# Patient Record
Sex: Female | Born: 1960 | Race: White | Hispanic: No | Marital: Married | Smoking: Never smoker
Health system: Southern US, Community
[De-identification: ages and names within clinical notes are randomized; demographics above are authoritative.]

## PROBLEM LIST (undated history)

## (undated) DIAGNOSIS — L409 Psoriasis, unspecified: Secondary | ICD-10-CM

## (undated) DIAGNOSIS — E559 Vitamin D deficiency, unspecified: Secondary | ICD-10-CM

## (undated) DIAGNOSIS — E119 Type 2 diabetes mellitus without complications: Secondary | ICD-10-CM

## (undated) DIAGNOSIS — E782 Mixed hyperlipidemia: Secondary | ICD-10-CM

## (undated) DIAGNOSIS — M5430 Sciatica, unspecified side: Secondary | ICD-10-CM

## (undated) DIAGNOSIS — G510 Bell's palsy: Secondary | ICD-10-CM

## (undated) HISTORY — DX: Vitamin D deficiency, unspecified: E55.9

## (undated) HISTORY — DX: Sciatica, unspecified side: M54.30

## (undated) HISTORY — DX: Bell's palsy: G51.0

## (undated) HISTORY — DX: Psoriasis, unspecified: L40.9

## (undated) HISTORY — DX: Mixed hyperlipidemia: E78.2

## (undated) HISTORY — PX: ANKLE ARTHROSCOPY: SUR85

## (undated) HISTORY — DX: Type 2 diabetes mellitus without complications: E11.9

## (undated) HISTORY — PX: ROBOTIC ASSISTED LAPAROSCOPIC VAGINAL HYSTERECTOMY WITH FIBROID REMOVAL: SHX5387

## (undated) HISTORY — PX: TONSILLECTOMY AND ADENOIDECTOMY: SUR1326

---

## 2008-04-07 ENCOUNTER — Ambulatory Visit: Payer: Self-pay | Admitting: Cardiology

## 2009-06-20 ENCOUNTER — Ambulatory Visit: Payer: Self-pay | Admitting: Specialist

## 2009-06-27 ENCOUNTER — Ambulatory Visit: Payer: Self-pay | Admitting: Specialist

## 2011-12-23 ENCOUNTER — Ambulatory Visit: Payer: Self-pay | Admitting: Family Medicine

## 2012-01-05 ENCOUNTER — Ambulatory Visit: Payer: Self-pay | Admitting: Family Medicine

## 2013-07-28 ENCOUNTER — Ambulatory Visit: Payer: Self-pay | Admitting: Specialist

## 2013-07-28 IMAGING — CR DG KNEE 1-2V*R*
1 series · 2 of 2 positions shown · non-contrast
Comparison: none

REASON FOR EXAM: right knee/ standing  knee pain
COMMENTS:

[Series 1: ap · 0.17mm/px · 2 of 2 slices shown]
[im 1/2]
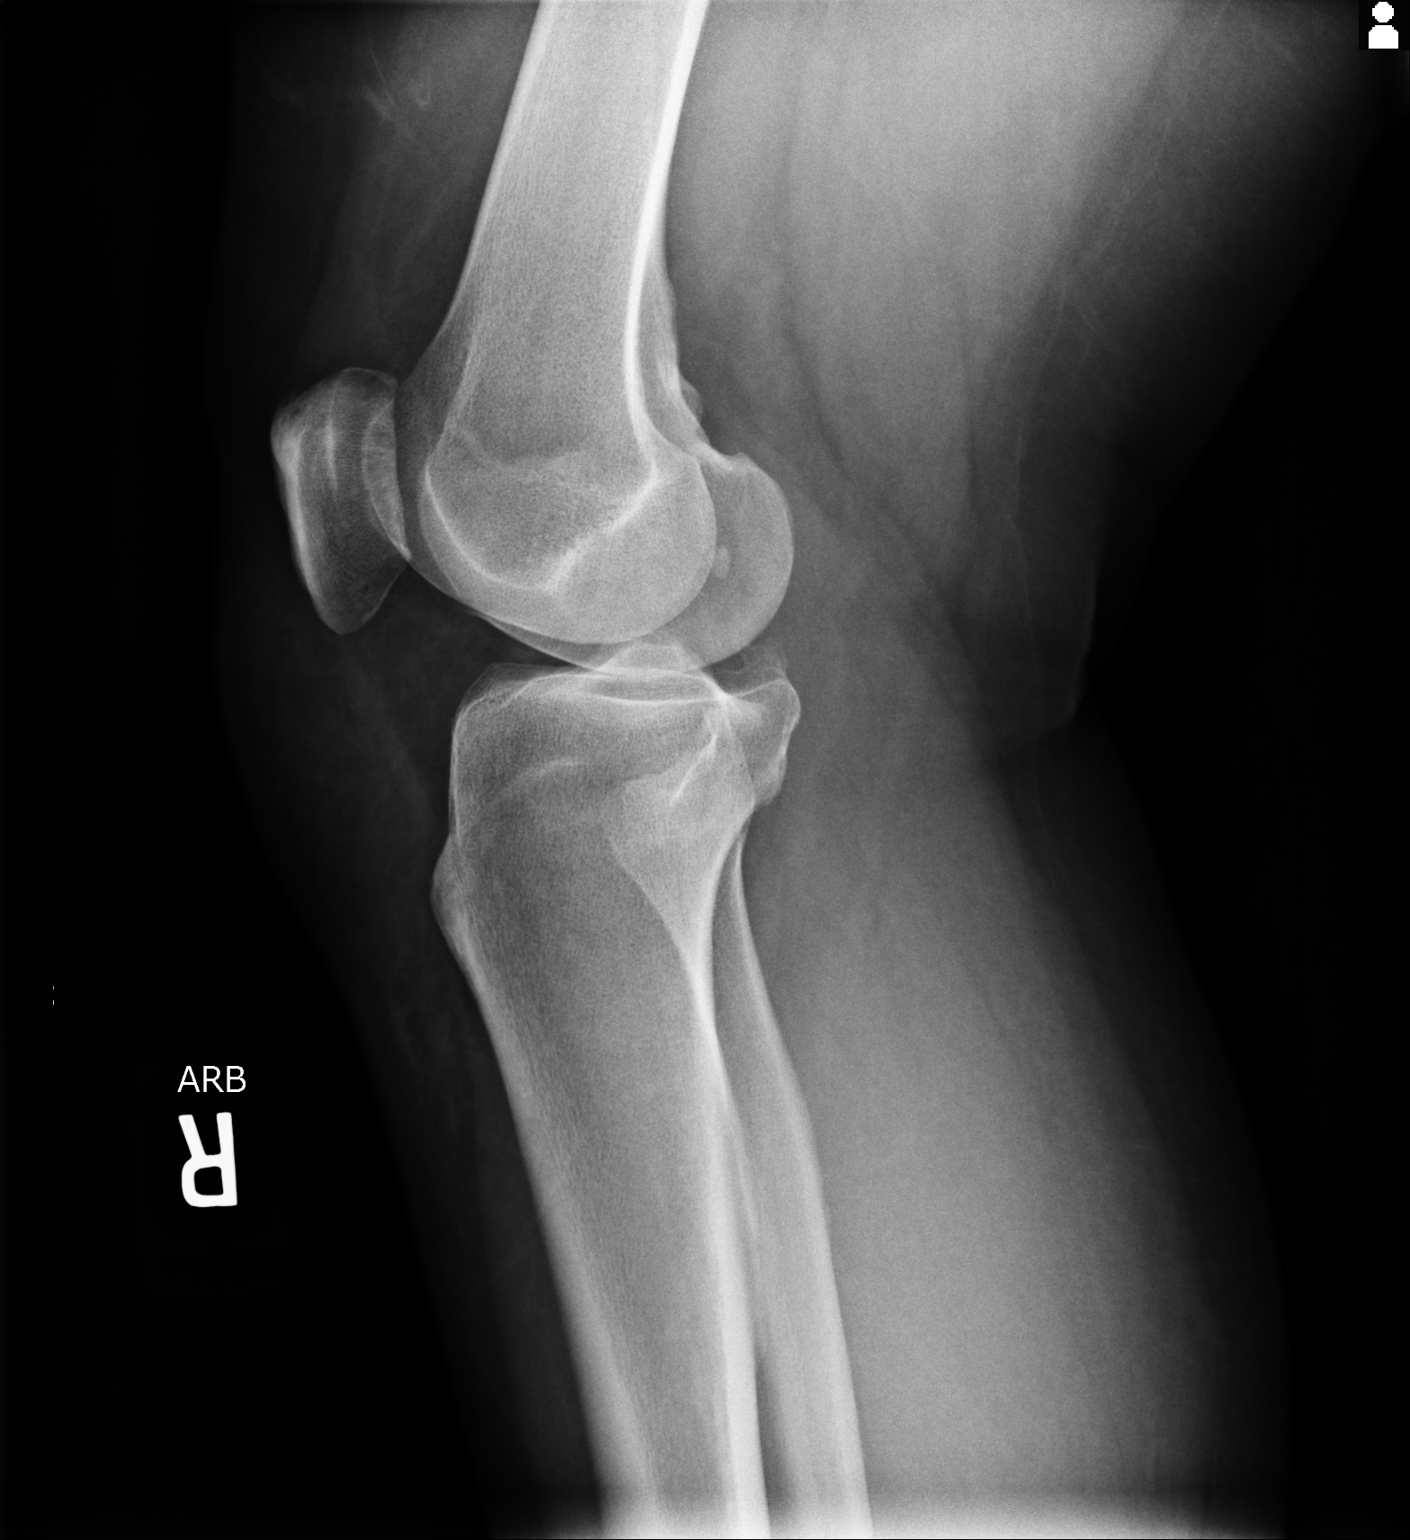
[im 2/2]
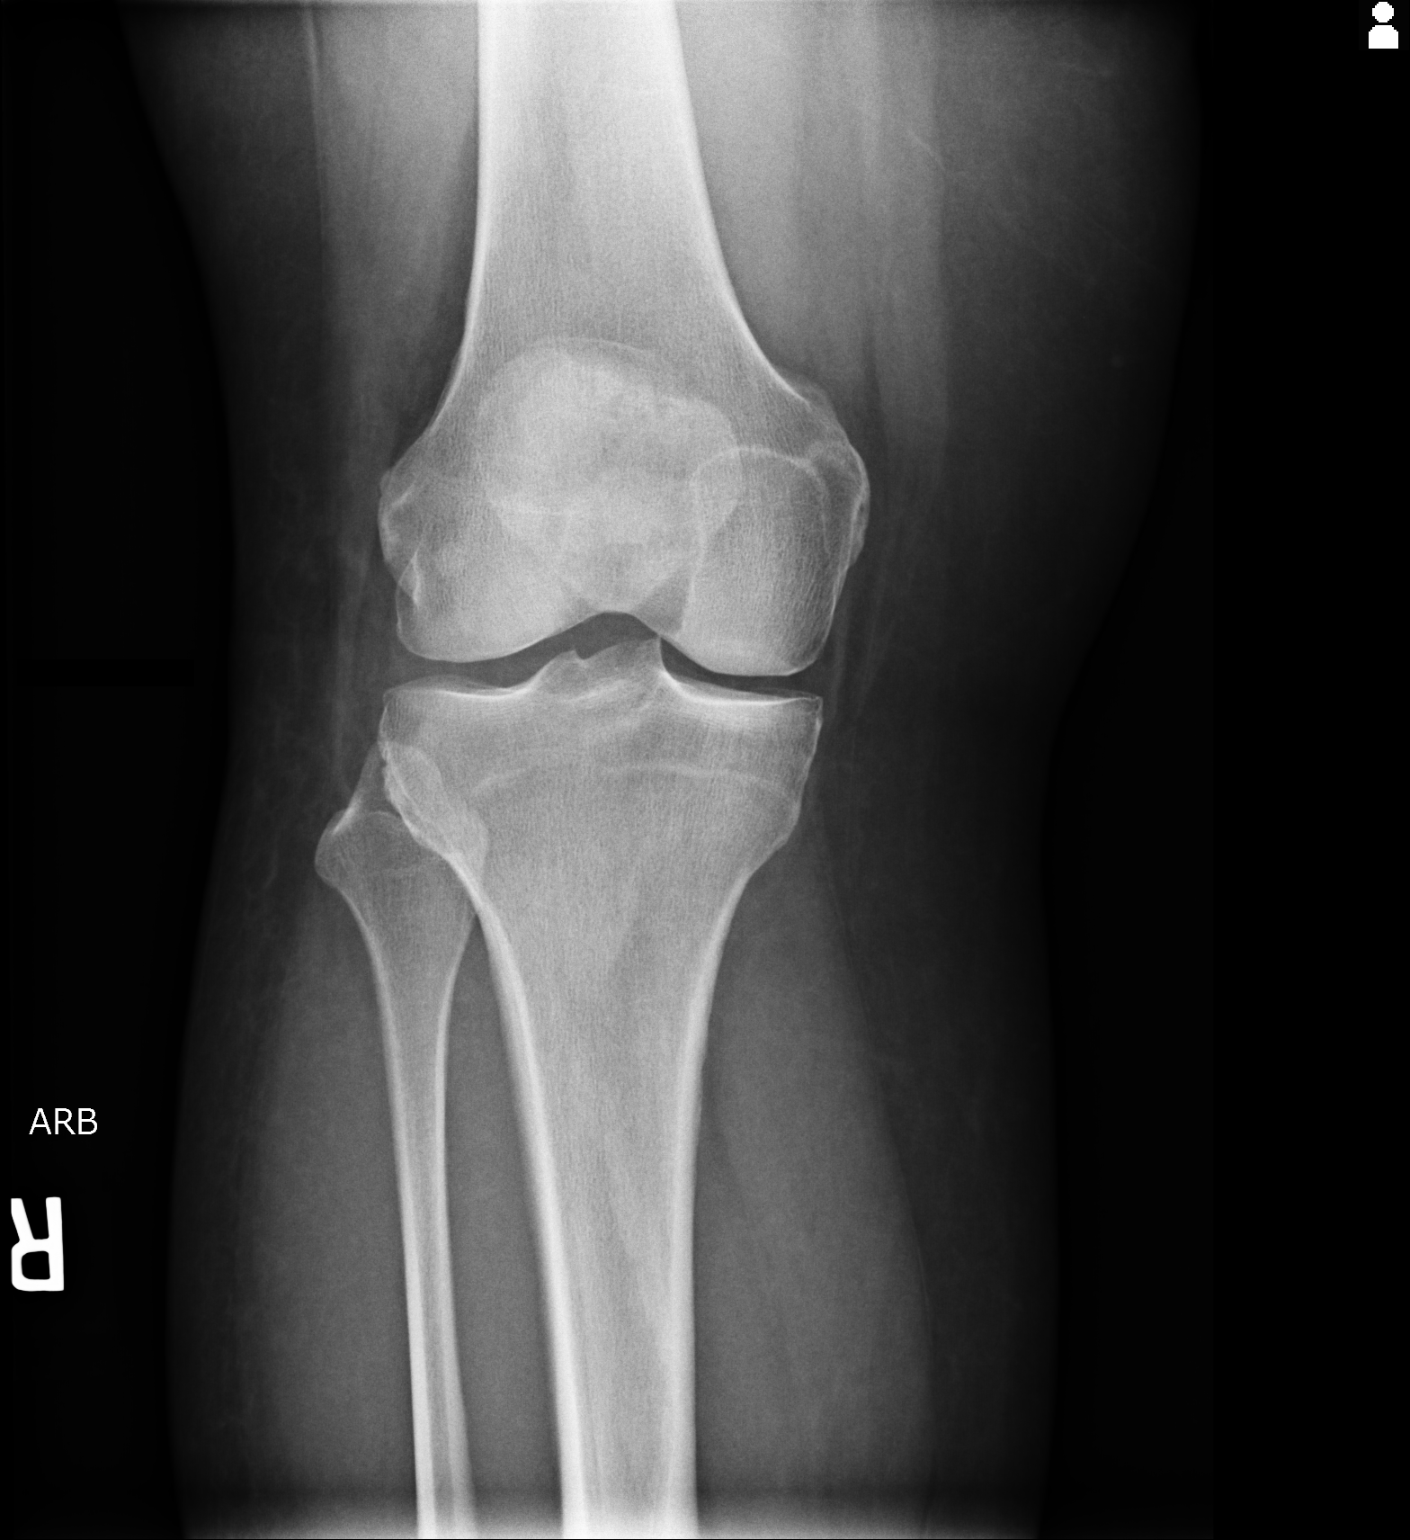

[2 of 2 positions shown; findings below may reference images not displayed]

PROCEDURE:     DXR - DXR KNEE RIGHT AP AND LATERAL  - [DATE]  [DATE]

RESULT:     AP and lateral views performed standing reveal the bones to be
adequately mineralized. There is minimal narrowing of the medial joint
compartment. There is beaking of the tibial spines. There is no evidence of
a joint effusion. No tibial plateau fracture is demonstrated. The proximal
fibula appears intact.
IMPRESSION: There is no evidence of an acute fracture of the right
knee. Mild degenerative changes are present.

[REDACTED]

## 2013-08-16 ENCOUNTER — Encounter: Payer: Self-pay | Admitting: Specialist

## 2013-08-27 ENCOUNTER — Encounter: Payer: Self-pay | Admitting: Specialist

## 2013-09-07 ENCOUNTER — Ambulatory Visit: Payer: Self-pay | Admitting: Specialist

## 2013-09-26 ENCOUNTER — Encounter: Payer: Self-pay | Admitting: Specialist

## 2013-10-27 DIAGNOSIS — M5430 Sciatica, unspecified side: Secondary | ICD-10-CM

## 2013-10-27 HISTORY — DX: Sciatica, unspecified side: M54.30

## 2013-12-14 ENCOUNTER — Emergency Department: Payer: Self-pay | Admitting: Emergency Medicine

## 2013-12-26 ENCOUNTER — Ambulatory Visit: Payer: Self-pay | Admitting: Specialist

## 2014-01-31 ENCOUNTER — Encounter: Payer: Self-pay | Admitting: Physical Medicine and Rehabilitation

## 2014-02-24 ENCOUNTER — Encounter: Payer: Self-pay | Admitting: Physical Medicine and Rehabilitation

## 2014-03-27 ENCOUNTER — Encounter: Payer: Self-pay | Admitting: Physical Medicine and Rehabilitation

## 2014-04-26 ENCOUNTER — Encounter: Payer: Self-pay | Admitting: Physical Medicine and Rehabilitation

## 2015-02-06 ENCOUNTER — Encounter: Payer: Self-pay | Admitting: *Deleted

## 2015-02-15 LAB — HM PAP SMEAR: HM PAP: NEGATIVE

## 2015-02-20 LAB — HEMOGLOBIN A1C: Hgb A1c MFr Bld: 11.3 % — AB (ref 4.0–6.0)

## 2015-02-20 LAB — LIPID PANEL
Cholesterol: 226 mg/dL — AB (ref 0–200)
LDL CALC: 142 mg/dL
TRIGLYCERIDES: 177 mg/dL — AB (ref 40–160)

## 2015-03-29 ENCOUNTER — Encounter: Payer: Self-pay | Admitting: Internal Medicine

## 2015-03-30 ENCOUNTER — Ambulatory Visit (INDEPENDENT_AMBULATORY_CARE_PROVIDER_SITE_OTHER): Payer: 59 | Admitting: Internal Medicine

## 2015-03-30 ENCOUNTER — Other Ambulatory Visit: Payer: Self-pay

## 2015-03-30 ENCOUNTER — Encounter: Payer: Self-pay | Admitting: Internal Medicine

## 2015-03-30 VITALS — BP 126/84 | HR 98 | Temp 97.9°F | Resp 12 | Ht 64.5 in | Wt 257.0 lb

## 2015-03-30 DIAGNOSIS — E1165 Type 2 diabetes mellitus with hyperglycemia: Secondary | ICD-10-CM | POA: Diagnosis not present

## 2015-03-30 DIAGNOSIS — E559 Vitamin D deficiency, unspecified: Secondary | ICD-10-CM

## 2015-03-30 DIAGNOSIS — E785 Hyperlipidemia, unspecified: Secondary | ICD-10-CM | POA: Diagnosis not present

## 2015-03-30 MED ORDER — GLUCOSE BLOOD VI STRP: ORAL_STRIP | Status: DC

## 2015-03-30 MED ORDER — INSULIN PEN NEEDLE 32G X 4 MM MISC: Status: DC

## 2015-03-30 MED ORDER — INSULIN GLARGINE 100 UNIT/ML SOLOSTAR PEN: 15.0000 [IU] | PEN_INJECTOR | Freq: Every day | SUBCUTANEOUS | Status: DC

## 2015-03-30 MED ORDER — ONETOUCH LANCETS MISC
Status: DC
Start: 2015-03-30 — End: 2017-03-17

## 2015-03-30 NOTE — Patient Instructions (Signed)
Please start Lantus 15 units at bedtime. After 4 days, if sugars still >130, you can increase to 20 units.  Please let me know if the sugars are consistently <80 or >200 afterwards.  Please return in 1.5 month with your sugar log.   PATIENT INSTRUCTIONS FOR TYPE 2 DIABETES:  **Please join MyChart!** - see attached instructions about how to join if you have not done so already.  DIET AND EXERCISE Diet and exercise is an important part of diabetic treatment.  We recommended aerobic exercise in the form of brisk walking (working between 40-60% of maximal aerobic capacity, similar to brisk walking) for 150 minutes per week (such as 30 minutes five days per week) along with 3 times per week performing 'resistance' training (using various gauge rubber tubes with handles) 5-10 exercises involving the major muscle groups (upper body, lower body and core) performing 10-15 repetitions (or near fatigue) each exercise. Start at half the above goal but build slowly to reach the above goals. If limited by weight, joint pain, or disability, we recommend daily walking in a swimming pool with water up to waist to reduce pressure from joints while allow for adequate exercise.    BLOOD GLUCOSES Monitoring your blood glucoses is important for continued management of your diabetes. Please check your blood glucoses 2-4 times a day: fasting, before meals and at bedtime (you can rotate these measurements - e.g. one day check before the 3 meals, the next day check before 2 of the meals and before bedtime, etc.).   HYPOGLYCEMIA (low blood sugar) Hypoglycemia is usually a reaction to not eating, exercising, or taking too much insulin/ other diabetes drugs.  Symptoms include tremors, sweating, hunger, confusion, headache, etc. Treat IMMEDIATELY with 15 grams of Carbs: . 4 glucose tablets .  cup regular juice/soda . 2 tablespoons raisins . 4 teaspoons sugar . 1 tablespoon honey Recheck blood glucose in 15 mins and  repeat above if still symptomatic/blood glucose <100.  RECOMMENDATIONS TO REDUCE YOUR RISK OF DIABETIC COMPLICATIONS: * Take your prescribed MEDICATION(S) * Follow a DIABETIC diet: Complex carbs, fiber rich foods, (monounsaturated and polyunsaturated) fats * AVOID saturated/trans fats, high fat foods, >2,300 mg salt per day. * EXERCISE at least 5 times a week for 30 minutes or preferably daily.  * DO NOT SMOKE OR DRINK more than 1 drink a day. * Check your FEET every day. Do not wear tightfitting shoes. Contact us if you develop an ulcer * See your EYE doctor once a year or more if needed * Get a FLU shot once a year * Get a PNEUMONIA vaccine once before and once after age 51 years  GOALS:  * Your Hemoglobin A1c of <7%  * fasting sugars need to be <130 * after meals sugars need to be <180 (2h after you start eating) * Your Systolic BP should be 630 or lower  * Your Diastolic BP should be 80 or lower  * Your HDL (Good Cholesterol) should be 40 or higher  * Your LDL (Bad Cholesterol) should be 100 or lower. * Your Triglycerides should be 150 or lower  * Your Urine microalbumin (kidney function) should be <30 * Your Body Mass Index should be 25 or lower    Please consider the following ways to cut down carbs and fat and increase fiber and micronutrients in your diet: - substitute whole grain for white bread or pasta - substitute brown rice for white rice - substitute 90-calorie flat bread pieces for slices of  bread when possible - substitute sweet potatoes or yams for white potatoes - substitute humus for margarine - substitute tofu for cheese when possible - substitute almond or rice milk for regular milk (would not drink soy milk daily due to concern for soy estrogen influence on breast cancer risk) - substitute dark chocolate for other sweets when possible - substitute water - can add lemon or orange slices for taste - for diet sodas (artificial sweeteners will trick your body that  you can eat sweets without getting calories and will lead you to overeating and weight gain in the long run) - do not skip breakfast or other meals (this will slow down the metabolism and will result in more weight gain over time)  - can try smoothies made from fruit and almond/rice milk in am instead of regular breakfast - can also try old-fashioned (not instant) oatmeal made with almond/rice milk in am - order the dressing on the side when eating salad at a restaurant (pour less than half of the dressing on the salad) - eat as little meat as possible - can try juicing, but should not forget that juicing will get rid of the fiber, so would alternate with eating raw veg./fruits or drinking smoothies - use as little oil as possible, even when using olive oil - can dress a salad with a mix of balsamic vinegar and lemon juice, for e.g. - use agave nectar, stevia sugar, or regular sugar rather than artificial sweateners - steam or broil/roast veggies  - snack on veggies/fruit/nuts (unsalted, preferably) when possible, rather than processed foods - reduce or eliminate aspartame in diet (it is in diet sodas, chewing gum, etc) Read the labels!  Try to read Dr. Janene Harvey book: "Program for Reversing Diabetes" for other ideas for healthy eating.

## 2015-03-30 NOTE — Progress Notes (Signed)
Patient ID: Cassandra Munoz, female   DOB: February 02, 1961, 54 y.o.   MRN: [id number]  HPI: Cassandra Munoz is a 54 y.o.-year-old female, referred by [name]), for management of DM2, 2012, non-insulin-dependent, uncontrolled, without complications.  She was told her HbA1c was high in the past but she did not follow up with PCP.  Denies blurry vision, but may have increased thirst.  Last hemoglobin A1c was: Lab Results  Component Value Date   HGBA1C 11.3* 02/20/2015  2012: HbA1c 7%  Pt on no meds yet.   Pt does check her sugars. - am: n/c - 2h after b'fast: n/c - before lunch: n/c - 2h after lunch: n/c - before dinner: n/c - 2h after dinner: n/c - bedtime: n/c - nighttime: n/c  ? hypoglycemia awareness. Highest sugar was 264 (nonfasting)  Glucometer: none  Pt's meals are: - Breakfast: can skip; honey nut cheerios + milk - Lunch: grilled cheese sandwich; grilled chicken sandwich; rarely salad or grilled bean - Dinner: [place] food;  - Snacks: chocolate, sweet tea  - no CKD, last BUN/creatinine:  02/20/2015: 11/0.73.  - last set of lipids: Lab Results  Component Value Date   CHOL 226* 02/20/2015   LDLCALC 142 02/20/2015   TRIG 177* 02/20/2015   - last eye exam was in 01/2015.No DR. - no numbness and tingling in her feet.  Pt has FH of DM in mother dx at 70 y/o, father.  She also has a h/o mild HTG: 177 on 02/20/2015 (nonfasting). LDL is high for a pt with DM: 142.  She also has a h/o vit D def (9.8 on 02/15/2015) >> started on Ergocalciferol by PCP on 02/22/2015.  ROS: Constitutional: no weight gain/loss, no fatigue, no subjective hyperthermia/hypothermia Eyes: no blurry vision, no xerophthalmia ENT: no sore throat, no nodules palpated in throat, no dysphagia/odynophagia, no hoarseness Cardiovascular: no CP/SOB/palpitations/leg swelling Respiratory: no cough/SOB Gastrointestinal: no N/V/D/C Musculoskeletal: no muscle/joint aches Skin: no  rashes Neurological: no tremors/numbness/tingling/dizziness Psychiatric: no depression/anxiety  Past Medical History  Diagnosis Date  . Psoriasis   . Enlarged uterus   . Enlarged uterus   . Abnormal glucose   . Vitamin D deficiency   . Elevated cholesterol with elevated triglycerides   . Psoriasis    Past Surgical History  Procedure Laterality Date  . Robotic assisted laparoscopic vaginal hysterectomy with fibroid removal      Fibroid removal  . Tonsillectomy and adenoidectomy     History   Social History Main Topics  . Smoking status: Never Smoker   . Smokeless tobacco: Not on file  . Alcohol Use: 0.0 oz/week    0 Standard drinks or equivalent per week  . Drug Use: No  . 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 Apremilast 30 MG TABS Take 2 mg by mouth daily.    . ergocalciferol (VITAMIN D2) 50000 UNITS capsule Take 50,000 Units by mouth 2 (two) times a week.     No current facility-administered medications on file prior to visit.   Allergies  Allergen Reactions  . Codeine Nausea Only    Other Reaction: oversedation  . Clobetasol Rash   Family History  Problem Relation Age of Onset  . Diabetes Mother   .  Cancer Father     Colon cancer  . Heart disease Father    PE: BP 126/84 mmHg  Pulse 98  Temp(Src) 97.9 F (36.6 C) (Oral)  Resp 12  Ht 5' 4.5" (1.638 m)  Wt 257 lb (116.574 kg)  BMI 43.45 kg/m2  SpO2 97% Wt Readings from Last 3 Encounters:  03/30/15 257 lb (116.574 kg)   Constitutional: obese, in NAD Eyes: PERRLA, EOMI, no exophthalmos ENT: moist mucous membranes, no thyromegaly, no cervical lymphadenopathy Cardiovascular: RRR, No MRG Respiratory: CTA B Gastrointestinal: abdomen soft, NT, ND,  BS+ Musculoskeletal: no deformities, strength intact in all 4 Skin: moist, warm, no rashes Neurological: no tremor with outstretched hands, DTR normal in all 4  ASSESSMENT: 1. DM2, new dx  2. HL  3. Vit D def  PLAN:  1. Patient with a 3 year h/o DM, now with a very high HbA1c. She has been found to have a HbA1c in the Diabetic range in 2012 during health screening at work and was advised to see PCP, but she did not follow through with this. We discussed that her DM appears very poorly controlled and we discussed about possible acute orr chronic complications if she does not achieve good control. We also discussed about the importance of diet and I made specific suggestions about improving her diet. She needs a nutrition appt >> will have this at [facility].  For now, I believe that she is very glucotoxic, and needs insulin. I explained that this is unlikely a permanent tx >> we may be able to stop this and switch to oral meds in the near future - I suggested to:  Patient Instructions  Please start Lantus 15 units at bedtime. After 4 days, if sugars still >130, you can increase to 20 units.  Please let me know if the sugars are consistently <80 or >200 afterwards.  Please return in 1.5 month with your sugar log.   - Strongly advised her to start checking sugars at different times of the day - check 1-2 times a day, rotating checks >> given a One Touch Verio meter and demonstrated use - given sugar log and advised how to fill it and to bring it at next appt  - given foot care handout and explained the principles  - given instructions for hypoglycemia management "15-15 rule"  - advised for yearly eye exams >> she is UTD - Return to clinic in 1.5 mo with sugar log   2. HL - Mild elevation in triglycerides, which is expected in the setting of uncontrolled diabetes; insulin will help - High LDL - we may need to start a statin if this does not improve after her diabetes improves.  3. Vit D  def - Reviewed the very low vitamin D level obtained month and a half ago - She is appropriately replaced with ergocalciferol 50,000 units 2x a week

## 2015-04-01 ENCOUNTER — Encounter: Payer: Self-pay | Admitting: Internal Medicine

## 2015-04-10 ENCOUNTER — Ambulatory Visit (INDEPENDENT_AMBULATORY_CARE_PROVIDER_SITE_OTHER): Payer: 59 | Admitting: Obstetrics and Gynecology

## 2015-04-10 ENCOUNTER — Encounter: Payer: Self-pay | Admitting: Obstetrics and Gynecology

## 2015-04-10 VITALS — BP 130/80 | HR 93 | Ht 65.0 in | Wt 258.2 lb

## 2015-04-10 DIAGNOSIS — D259 Leiomyoma of uterus, unspecified: Secondary | ICD-10-CM | POA: Diagnosis not present

## 2015-04-10 DIAGNOSIS — R638 Other symptoms and signs concerning food and fluid intake: Secondary | ICD-10-CM

## 2015-04-10 DIAGNOSIS — N393 Stress incontinence (female) (male): Secondary | ICD-10-CM

## 2015-04-11 ENCOUNTER — Encounter: Payer: Self-pay | Admitting: Obstetrics and Gynecology

## 2015-04-11 DIAGNOSIS — D219 Benign neoplasm of connective and other soft tissue, unspecified: Secondary | ICD-10-CM | POA: Insufficient documentation

## 2015-04-11 DIAGNOSIS — N393 Stress incontinence (female) (male): Secondary | ICD-10-CM | POA: Insufficient documentation

## 2015-04-11 DIAGNOSIS — R638 Other symptoms and signs concerning food and fluid intake: Secondary | ICD-10-CM | POA: Insufficient documentation

## 2015-04-11 NOTE — Patient Instructions (Signed)
1.  Patient scheduled for TAH/BSO. 2.  Patient to return for preop appointment.

## 2015-04-11 NOTE — Progress Notes (Signed)
GYN ENCOUNTER NOTE  Subjective:       Cassandra Munoz is a 54 y.o. No obstetric history on file. female is here for gynecologic evaluation of the following issues:  1. Symptomatic fibroid uterus.  The patient is a 54 year old married white female, para 0, menopausal, who presents in referral from [facility] for evaluation of some dramatic fibroid uterus.  Patient became menopausal 1.5 years ago.  She has noted over the past 5 years.  They abdominal pelvic discomfort and deep thrusting dyspareunia.  She delayed medical attention until this time.  GU symptoms: Frequency 6 times per day. Nocturia 1-2 times per night. No urgency symptoms. Stress incontinence present with occasional need to wear pads.  GI symptoms: Bowel movements several times per day. No chronic constipation.   Gynecologic History No LMP recorded. Patient is postmenopausal.  Menarche age 28. Cycles were monthly throughout her reproductive life. No history of dysmenorrhea. Patient always feels warm but does not note specific vasomotor symptoms. Contraception: post menopausal status Last Pap: 2016. Results were: normal  Obstetric History OB History  No data available    Past Medical History  Diagnosis Date  . Psoriasis   . Enlarged uterus   . Enlarged uterus   . Abnormal glucose   . Vitamin D deficiency   . Elevated cholesterol with elevated triglycerides   . Psoriasis     Past Surgical History  Procedure Laterality Date  . Robotic assisted laparoscopic vaginal hysterectomy with fibroid removal      Fibroid removal  . Tonsillectomy and adenoidectomy    . Ankle arthroscopy Right     Current Outpatient Prescriptions on File Prior to Visit  Medication Sig Dispense Refill  . Apremilast 30 MG TABS Take 2 mg by mouth daily.    . ergocalciferol (VITAMIN D2) 50000 UNITS capsule Take 50,000 Units by mouth 2 (two) times a week.    Marland Kitchen glucose blood (ONETOUCH VERIO) test strip Use to check blood sugar 2 times  per day 100 each 12  . Insulin Glargine (LANTUS SOLOSTAR) 100 UNIT/ML Solostar Pen Inject 15 Units into the skin at bedtime. 5 pen 1  . Insulin Pen Needle (BD PEN NEEDLE NANO U/F) 32G X 4 MM MISC Use daily 100 each 1  . ONE TOUCH LANCETS MISC Use to check blood sugar 2 times per day 200 each 1   No current facility-administered medications on file prior to visit.    Allergies  Allergen Reactions  . Codeine Nausea Only    Other Reaction: oversedation  . Clobetasol Rash    History   Social History  . Marital Status: Married    Spouse Name: N/A  . Number of Children: N/A  . Years of Education: N/A   Occupational History  . Not on file.   Social History Main Topics  . Smoking status: Never Smoker   . Smokeless tobacco: Not on file  . Alcohol Use: 1.2 oz/week    2 Standard drinks or equivalent per week  . Drug Use: No  . Sexual Activity: Yes   Other Topics Concern  . 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 Diabetes Mother   . Colon cancer Father   . Heart disease  Father   . Hypertension Mother   . Hypertension Father     The following portions of the patient's history were reviewed and updated as appropriate: allergies, current medications, past family history, past medical history, past social history, past surgical history and problem list.  Review of Systems  Review of Systems - General ROS: negative for - chills, fatigue, fever, hot flashes, malaise or night sweats.POSITIVE-patient always feels warm. Hematological and Lymphatic ROS: negative for - bleeding problems or swollen lymph nodes Gastrointestinal ROS: negative for - blood in stools, change in bowel habits and nausea/vomiting. POSITIVE vague abdominal pain Musculoskeletal ROS: negative for - joint pain,  muscle pain or muscular weakness Genito-Urinary ROS: negative for - change in menstrual cycle, dysmenorrhea, dyspareunia, dysuria, genital discharge, genital ulcers, hematuria,  irregular/heavy menses, nocturia or pelvic pain. POSITIVE-stress incontinence  Objective:   BP 130/80 mmHg  Pulse 93  Ht 5\' 5"  (1.651 m)  Wt 258 lb 3.4 oz (117.123 kg)  BMI 42.97 kg/m2 CONSTITUTIONAL: Well-developed, well-nourished female in no acute distress.  HENT:  Normocephalic, atraumatic.  NECK: Normal range of motion, supple, no masses.  Normal thyroid.  SKIN: Skin is warm and dry. No rash noted. Not diaphoretic. No erythema. No pallor. [place]: Alert and oriented to person, place, and time. PSYCHIATRIC: Normal mood and affect. Normal behavior. Normal judgment and thought content. CARDIOVASCULAR:Not Examined RESPIRATORY: Not Examined BREASTS: Not Examined ABDOMEN: Soft, non distended; Non tender.  No Organomegaly. PELVIC:  External Genitalia: Normal  BUS: Normal  Vagina: Normal  Cervix: Normal; Located high in the vaginal vault; mild cervical motion tenderness  Uterus: Enlarged, 16 week size; left sided prominence; anteriorly located; mildly tender  Adnexa: Normal  RV: Normal External exam; internal not done  Bladder: Non tender MUSCULOSKELETAL: Normal range of motion. No tenderness.  No cyanosis, clubbing, or edema.     Assessment:   1.  Uterine leiomyoma, symptomatic; 16 weeks size. 2.  Chronic abdominal pain and dyspareunia, secondary to fibroids. 3.  Increased body mass index. 4.  Type 2 diabetes mellitus, insulin requiring. 5.  Mild stress incontinence. 6.  Family history colon cancer; no colonoscopy screening     Plan:   1.  Extensive discussion regarding management planning of symptomatic fibroid uterus.  Pros and cons of non surgical versus surgical intervention were reviewed.  Specific options of surgical intervention were discussed in detail.  Recommendation is for patient to  proceed with TAH/BSO through a midline incision.  She was given an option to have GYN oncology second opinion for possible robotic hysterectomy but declined.   2.  Family history colon cancer.  Patient is strongly encouraged to proceed with colonoscopy screening due to family history and father.  3.  Patient is strongly encouraged to obtain mammography screening as she is overdue.  A total of 45 minutes were spent face-to-face with the patient during the encounter with greater than 50% dealing with counseling and coordination of care.

## 2015-04-23 ENCOUNTER — Telehealth: Payer: Self-pay

## 2015-04-23 NOTE — Telephone Encounter (Signed)
Left message for pt. Stating her disabilty papers are ready. Just need doctors signature and I will fax.  If any questions, please contact office.

## 2015-04-24 ENCOUNTER — Other Ambulatory Visit: Payer: Self-pay | Admitting: Obstetrics and Gynecology

## 2015-04-24 DIAGNOSIS — Z1231 Encounter for screening mammogram for malignant neoplasm of breast: Secondary | ICD-10-CM

## 2015-04-25 ENCOUNTER — Ambulatory Visit
Admission: RE | Admit: 2015-04-25 | Discharge: 2015-04-25 | Disposition: A | Discharge status: Home / Self Care | Payer: 59 | Source: Ambulatory Visit | Attending: Obstetrics and Gynecology | Admitting: Obstetrics and Gynecology

## 2015-04-25 ENCOUNTER — Other Ambulatory Visit: Payer: Self-pay | Admitting: Obstetrics and Gynecology

## 2015-04-25 DIAGNOSIS — Z1231 Encounter for screening mammogram for malignant neoplasm of breast: Secondary | ICD-10-CM | POA: Insufficient documentation

## 2015-04-26 ENCOUNTER — Other Ambulatory Visit: Payer: Self-pay | Admitting: Obstetrics and Gynecology

## 2015-04-26 DIAGNOSIS — N6489 Other specified disorders of breast: Secondary | ICD-10-CM

## 2015-04-26 DIAGNOSIS — R928 Other abnormal and inconclusive findings on diagnostic imaging of breast: Secondary | ICD-10-CM

## 2015-05-02 ENCOUNTER — Ambulatory Visit (INDEPENDENT_AMBULATORY_CARE_PROVIDER_SITE_OTHER): Payer: 59 | Admitting: Obstetrics and Gynecology

## 2015-05-02 ENCOUNTER — Encounter: Payer: Self-pay | Admitting: Obstetrics and Gynecology

## 2015-05-02 ENCOUNTER — Encounter
Admission: RE | Admit: 2015-05-02 | Discharge: 2015-05-02 | Disposition: A | Discharge status: Home / Self Care | Payer: 59 | Source: Ambulatory Visit | Attending: Obstetrics and Gynecology | Admitting: Obstetrics and Gynecology

## 2015-05-02 VITALS — BP 118/79 | HR 98 | Ht 65.0 in | Wt 256.1 lb

## 2015-05-02 DIAGNOSIS — R928 Other abnormal and inconclusive findings on diagnostic imaging of breast: Secondary | ICD-10-CM | POA: Diagnosis not present

## 2015-05-02 DIAGNOSIS — N941 Dyspareunia: Secondary | ICD-10-CM

## 2015-05-02 DIAGNOSIS — IMO0002 Reserved for concepts with insufficient information to code with codable children: Secondary | ICD-10-CM

## 2015-05-02 DIAGNOSIS — G8929 Other chronic pain: Secondary | ICD-10-CM

## 2015-05-02 DIAGNOSIS — R109 Unspecified abdominal pain: Secondary | ICD-10-CM

## 2015-05-02 DIAGNOSIS — N6489 Other specified disorders of breast: Secondary | ICD-10-CM | POA: Diagnosis present

## 2015-05-02 DIAGNOSIS — Z01818 Encounter for other preprocedural examination: Secondary | ICD-10-CM

## 2015-05-02 DIAGNOSIS — D259 Leiomyoma of uterus, unspecified: Secondary | ICD-10-CM

## 2015-05-02 LAB — BASIC METABOLIC PANEL
Anion gap: 8 (ref 5–15)
BUN: 15 mg/dL (ref 6–20)
CO2: 22 mmol/L (ref 22–32)
CREATININE: 0.62 mg/dL (ref 0.44–1.00)
Calcium: 8.8 mg/dL — ABNORMAL LOW (ref 8.9–10.3)
Chloride: 106 mmol/L (ref 101–111)
GFR calc Af Amer: >60 mL/min (ref >60)
GLUCOSE: 153 mg/dL — AB (ref 65–99)
POTASSIUM: 4 mmol/L (ref 3.5–5.1)
Sodium: 136 mmol/L (ref 135–145)

## 2015-05-02 LAB — TYPE AND SCREEN
ABO/RH(D): A POS
ANTIBODY SCREEN: NEGATIVE

## 2015-05-02 LAB — CBC WITH DIFFERENTIAL/PLATELET
Basophils Absolute: 0 10*3/uL (ref 0–0.1)
Basophils Relative: 0 %
EOS PCT: 2 %
Eosinophils Absolute: 0.1 10*3/uL (ref 0–0.7)
HCT: 42.3 % (ref 35.0–47.0)
Hemoglobin: 14 g/dL (ref 12.0–16.0)
LYMPHS ABS: 2 10*3/uL (ref 1.0–3.6)
Lymphocytes Relative: 31 %
MCH: 29.3 pg (ref 26.0–34.0)
MCHC: 33.2 g/dL (ref 32.0–36.0)
MCV: 88.3 fL (ref 80.0–100.0)
Monocytes Absolute: 0.6 10*3/uL (ref 0.2–0.9)
Monocytes Relative: 10 %
NEUTROS ABS: 3.7 10*3/uL (ref 1.4–6.5)
Neutrophils Relative %: 57 %
PLATELETS: 256 10*3/uL (ref 150–440)
RBC: 4.79 MIL/uL (ref 3.80–5.20)
RDW: 14 % (ref 11.5–14.5)
WBC: 6.5 10*3/uL (ref 3.6–11.0)

## 2015-05-02 LAB — ABO/RH: ABO/RH(D): A POS

## 2015-05-02 LAB — RAPID HIV SCREEN (HIV 1/2 AB+AG)
HIV 1/2 Antibodies: NONREACTIVE
HIV-1 P24 Antigen - HIV24: NONREACTIVE

## 2015-05-02 NOTE — Progress Notes (Addendum)
Patient ID: Cassandra Munoz, female   DOB: 1960-11-20, 54 y.o.   MRN: [id number]   Pre-op- tah +/- bso- 05/07/2015- cPP and fibroids  Preoperative history and physical    The Patient is a 54 y.o. married white.female, para 0, menopausal,  scheduled for Total abdominal hysterectomy and bilateral salpingo-oophorectomy on 05/07/2015. Indications for procedure are symptomatic fibroid uterus.  Pertinent Gynecological History: No LMP recorded. Patient is postmenopausal. Menarche age 54. Cycles were monthly throughout her reproductive life. No history of dysmenorrhea. Patient always feels warm but does not note specific vasomotor symptoms. Last pap: normal Date: 2016.  Discussed Blood/Blood Products: yes   Menstrual History: OB History    No data available      Menarche age: 54  Patient's last menstrual period was 05/01/2013.    Past Medical History  Diagnosis Date  . Psoriasis   . Enlarged uterus   . Enlarged uterus   . Abnormal glucose   . Vitamin D deficiency   . Elevated cholesterol with elevated triglycerides   . Psoriasis   . Diabetes mellitus without complication     Past Surgical History  Procedure Laterality Date  . Robotic assisted laparoscopic vaginal hysterectomy with fibroid removal      Fibroid removal  . Tonsillectomy and adenoidectomy    . Ankle arthroscopy Right     OB History  No data available    History   Social History  . Marital Status: Married    Spouse Name: N/A  . Number of Children: N/A  . Years of Education: N/A   Social History Main Topics  . Smoking status: Never Smoker   . Smokeless tobacco: Not on file  . Alcohol Use: 1.2 oz/week    2 Standard drinks or equivalent per week  . Drug Use: No  . Sexual Activity: Yes   Other Topics Concern  . 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 Diabetes Mother   . Hypertension Mother   . Colon cancer Father   . Heart disease Father   . Hypertension Father   . Breast cancer Paternal Aunt     times 3     (Not in a hospital admission)  Allergies  Allergen Reactions  . Codeine Nausea Only    Other Reaction: oversedation  . Clobetasol Rash    Review of Systems Constitutional: No recent fever/chills/sweats Respiratory: No recent cough/bronchitis Cardiovascular: No chest pain Gastrointestinal: No recent nausea/vomiting/diarrhea Genitourinary: no UTI symptoms Hematologic/lymphatic:No history of coagulopathy or recent blood thinner use    Objective:    BP 118/79 mmHg  Pulse 98  Ht 5\' 5"  (1.651 m)  Wt 256 lb 1.6 oz (116.166 kg)  BMI 42.62 kg/m2  LMP 05/01/2013  General:   Normal  Skin:   normal  HEENT:  Normal  Neck:  Supple without Adenopathy or Thyromegaly  Lungs:   Heart:              Breasts:   Abdomen:  Pelvis:  M/S   Extremeties:  Neuro:    clear to auscultation bilaterally   Normal without murmur   Not Examined   soft, non-tender; bowel sounds normal; no masses,  no organomegaly   Exam deferred to OR  No  CVAT  Warm/Dry   Normal          Assessment:    1. Uterine leiomyoma, symptomatic; 16 weeks size 2. Chronic abdominal pain and dyspareunia, secondary to fibroids   Plan:   1.  TAH/BSO.  Counseling note the patient is to undergo total abdominal hysterectomy, bilateral salpingo-oophorectomy through a midline incision for symptomatic fibroid uterus.  She is understanding of the planned procedure and is aware of and is accepting of all surgical risks which include but are not limited to bleeding, infection, pelvic organs with need for repair, blood disorders, anesthesia risks, etc.  She understands that she may require ERT therapy for control of vasomotor symptoms and or for prevention of osteoporosis.  The on cQ pump will be utilized per  her request. All questions have been answered.  Informed consent is given.  Patient is ready and willing to proceed with surgery as scheduled.  Date of Initial H&P: 05/02/2015  History reviewed, patient examined, no change in status, stable for surgery.

## 2015-05-02 NOTE — Patient Instructions (Signed)
  Your procedure is scheduled on: @7 -11-16  Monday Report to Day Surgery. To find out your arrival time please call [phone] between 1PM - 3PM on7-8-16  Fridayx  Remember: Instructions that are not followed completely may result in serious medical risk, up to and including death, or upon the discretion of your surgeon and anesthesiologist your surgery may need to be rescheduled.    __x__ 1. Do not eat food or drink liquids after midnight. No gum chewing or hard candies.     ____ 2. No Alcohol for 24 hours before or after surgery.   ____ 3. Bring all medications with you on the day of surgery if instructed.    __x__ 4. Notify your doctor if there is any change in your medical condition     (cold, fever, infections).     Do not wear jewelry, make-up, hairpins, clips or nail polish.  Do not wear lotions, powders, or perfumes. You may wear deodorant.  Do not shave 48 hours prior to surgery. Men may shave face and neck.  Do not bring valuables to the hospital.    [facility] is not responsible for any belongings or valuables.               Contacts, dentures or bridgework may not be worn into surgery.  Leave your suitcase in the car. After surgery it may be brought to your room.  For patients admitted to the hospital, discharge time is determined by your                treatment team.   Patients discharged the day of surgery will not be allowed to drive home.   Please read over the following fact sheets that you were given:      ____ Take these medicines the morning of surgery with A SIP OF WATER:    1  2.   3.   4.  5.  6.  ____ Fleet Enema (as directed)   ____ Use CHG Soap as directed  ____ Use inhalers on the day of surgery  ____ Stop metformin 2 days prior to surgery    __x__ Take 1/2 of usual insulin dose the night before surgery and none on the morning of surgery. Insulin Glargine (LANTUS SOLOSTAR) 100 UNIT/ML Solostar Pen  ____ Stop Coumadin/Plavix/aspirin on    ____ Stop Anti-inflammatories on    ____ Stop supplements until after surgery.    ____ Bring C-Pap to the hospital.

## 2015-05-03 ENCOUNTER — Ambulatory Visit
Admission: RE | Admit: 2015-05-03 | Discharge: 2015-05-03 | Disposition: A | Discharge status: Home / Self Care | Payer: 59 | Source: Ambulatory Visit | Attending: Obstetrics and Gynecology | Admitting: Obstetrics and Gynecology

## 2015-05-03 DIAGNOSIS — R928 Other abnormal and inconclusive findings on diagnostic imaging of breast: Secondary | ICD-10-CM | POA: Insufficient documentation

## 2015-05-03 DIAGNOSIS — N6489 Other specified disorders of breast: Secondary | ICD-10-CM

## 2015-05-03 LAB — RPR: RPR: NONREACTIVE

## 2015-05-07 ENCOUNTER — Encounter
Admission: RE | Disposition: A | Discharge status: Home / Self Care | Payer: Self-pay | Source: Ambulatory Visit | Attending: Obstetrics and Gynecology

## 2015-05-07 ENCOUNTER — Inpatient Hospital Stay
Admission: RE | Admit: 2015-05-07 | Discharge: 2015-05-10 | DRG: 742 | Disposition: A | Discharge status: Home / Self Care | Payer: 59 | Source: Ambulatory Visit | Attending: Obstetrics and Gynecology | Admitting: Obstetrics and Gynecology

## 2015-05-07 ENCOUNTER — Encounter: Payer: Self-pay | Admitting: *Deleted

## 2015-05-07 ENCOUNTER — Inpatient Hospital Stay: Payer: 59 | Admitting: Anesthesiology

## 2015-05-07 DIAGNOSIS — R109 Unspecified abdominal pain: Secondary | ICD-10-CM | POA: Diagnosis present

## 2015-05-07 DIAGNOSIS — Z6841 Body Mass Index (BMI) 40.0 and over, adult: Secondary | ICD-10-CM

## 2015-05-07 DIAGNOSIS — E119 Type 2 diabetes mellitus without complications: Secondary | ICD-10-CM | POA: Diagnosis present

## 2015-05-07 DIAGNOSIS — G8929 Other chronic pain: Secondary | ICD-10-CM | POA: Diagnosis present

## 2015-05-07 DIAGNOSIS — N941 Dyspareunia: Secondary | ICD-10-CM | POA: Diagnosis not present

## 2015-05-07 DIAGNOSIS — N959 Unspecified menopausal and perimenopausal disorder: Secondary | ICD-10-CM | POA: Diagnosis present

## 2015-05-07 DIAGNOSIS — D259 Leiomyoma of uterus, unspecified: Secondary | ICD-10-CM | POA: Diagnosis not present

## 2015-05-07 DIAGNOSIS — E669 Obesity, unspecified: Secondary | ICD-10-CM | POA: Diagnosis present

## 2015-05-07 HISTORY — PX: ABDOMINAL HYSTERECTOMY: SHX81

## 2015-05-07 LAB — GLUCOSE, CAPILLARY
GLUCOSE-CAPILLARY: 216 mg/dL — AB (ref 65–99)
Glucose-Capillary: 140 mg/dL — ABNORMAL HIGH (ref 65–99)

## 2015-05-07 SURGERY — HYSTERECTOMY, ABDOMINAL
Anesthesia: General

## 2015-05-07 MED ORDER — HYDROMORPHONE HCL 1 MG/ML IJ SOLN
INTRAMUSCULAR | Status: AC
  Administered 2015-05-07: 0.5 mg via INTRAVENOUS
  Filled 2015-05-07: qty 1

## 2015-05-07 MED ORDER — BUPIVACAINE HCL 0.5 % IJ SOLN
50.0000 mL | Freq: Once | INTRAMUSCULAR | Status: AC
  Administered 2015-05-07: 10 mL

## 2015-05-07 MED ORDER — HYDROMORPHONE HCL 1 MG/ML IJ SOLN
0.2500 mg | INTRAMUSCULAR | Status: DC | PRN
  Administered 2015-05-07 (×6): 0.5 mg via INTRAVENOUS

## 2015-05-07 MED ORDER — LACTATED RINGERS IV SOLN: INTRAVENOUS | Status: DC

## 2015-05-07 MED ORDER — LIDOCAINE HCL (CARDIAC) 20 MG/ML IV SOLN
INTRAVENOUS | Status: DC | PRN
  Administered 2015-05-07: 100 mg via INTRAVENOUS

## 2015-05-07 MED ORDER — METOCLOPRAMIDE HCL 5 MG/ML IJ SOLN
10.0000 mg | Freq: Once | INTRAMUSCULAR | Status: DC | PRN
Start: 2015-05-07 — End: 2015-05-07

## 2015-05-07 MED ORDER — FAMOTIDINE 20 MG PO TABS
ORAL_TABLET | ORAL | Status: AC
  Administered 2015-05-07: 20 mg via ORAL
  Filled 2015-05-07: qty 1

## 2015-05-07 MED ORDER — SIMETHICONE 80 MG PO CHEW
80.0000 mg | CHEWABLE_TABLET | Freq: Four times a day (QID) | ORAL | Status: DC | PRN
  Administered 2015-05-08 – 2015-05-09 (×4): 80 mg via ORAL
  Filled 2015-05-07 (×4): qty 1

## 2015-05-07 MED ORDER — HYDROMORPHONE HCL 1 MG/ML IJ SOLN: 0.2500 mg | INTRAMUSCULAR | Status: DC | PRN

## 2015-05-07 MED ORDER — KETOROLAC TROMETHAMINE 30 MG/ML IJ SOLN
30.0000 mg | Freq: Four times a day (QID) | INTRAMUSCULAR | Status: DC
  Filled 2015-05-07: qty 1

## 2015-05-07 MED ORDER — ROCURONIUM BROMIDE 100 MG/10ML IV SOLN
INTRAVENOUS | Status: DC | PRN
  Administered 2015-05-07: 10 mg via INTRAVENOUS
  Administered 2015-05-07: 30 mg via INTRAVENOUS
  Administered 2015-05-07: 20 mg via INTRAVENOUS

## 2015-05-07 MED ORDER — MIDAZOLAM HCL 2 MG/2ML IJ SOLN
INTRAMUSCULAR | Status: DC | PRN
  Administered 2015-05-07: 2 mg via INTRAVENOUS

## 2015-05-07 MED ORDER — CEFAZOLIN SODIUM-DEXTROSE 2-3 GM-% IV SOLR
INTRAVENOUS | Status: AC
  Administered 2015-05-07: 2 g via INTRAVENOUS
  Filled 2015-05-07: qty 50

## 2015-05-07 MED ORDER — BUPIVACAINE 0.25 % ON-Q PUMP DUAL CATH 400 ML
400.0000 mL | INJECTION | Status: DC
  Filled 2015-05-07: qty 400

## 2015-05-07 MED ORDER — KETOROLAC TROMETHAMINE 30 MG/ML IJ SOLN
30.0000 mg | Freq: Four times a day (QID) | INTRAMUSCULAR | Status: DC
  Administered 2015-05-07 – 2015-05-08 (×3): 30 mg via INTRAVENOUS
  Filled 2015-05-07 (×2): qty 1

## 2015-05-07 MED ORDER — LIDOCAINE 5 % EX PTCH
MEDICATED_PATCH | CUTANEOUS | Status: AC
  Filled 2015-05-07: qty 1

## 2015-05-07 MED ORDER — SODIUM CHLORIDE 0.9 % IV SOLN
INTRAVENOUS | Status: DC
  Administered 2015-05-07 (×2): via INTRAVENOUS

## 2015-05-07 MED ORDER — ACETAMINOPHEN 325 MG PO TABS: 650.0000 mg | ORAL_TABLET | ORAL | Status: DC | PRN

## 2015-05-07 MED ORDER — FAMOTIDINE 20 MG PO TABS
20.0000 mg | ORAL_TABLET | Freq: Once | ORAL | Status: AC
  Administered 2015-05-07: 20 mg via ORAL

## 2015-05-07 MED ORDER — ACETAMINOPHEN 10 MG/ML IV SOLN
INTRAVENOUS | Status: AC
  Filled 2015-05-07: qty 100

## 2015-05-07 MED ORDER — LACTATED RINGERS IV SOLN
INTRAVENOUS | Status: DC
  Administered 2015-05-07 – 2015-05-08 (×3): via INTRAVENOUS

## 2015-05-07 MED ORDER — DOCUSATE SODIUM 100 MG PO CAPS
100.0000 mg | ORAL_CAPSULE | Freq: Two times a day (BID) | ORAL | Status: DC
Start: 2015-05-07 — End: 2015-05-10
  Administered 2015-05-07 – 2015-05-10 (×5): 100 mg via ORAL
  Filled 2015-05-07 (×5): qty 1

## 2015-05-07 MED ORDER — SUCCINYLCHOLINE CHLORIDE 20 MG/ML IJ SOLN
INTRAMUSCULAR | Status: DC | PRN
  Administered 2015-05-07: 120 mg via INTRAVENOUS

## 2015-05-07 MED ORDER — ACETAMINOPHEN 10 MG/ML IV SOLN
INTRAVENOUS | Status: DC | PRN
  Administered 2015-05-07: 1000 mg via INTRAVENOUS

## 2015-05-07 MED ORDER — FENTANYL CITRATE (PF) 100 MCG/2ML IJ SOLN
INTRAMUSCULAR | Status: DC | PRN
  Administered 2015-05-07: 50 ug via INTRAVENOUS
  Administered 2015-05-07: 100 ug via INTRAVENOUS
  Administered 2015-05-07 (×3): 50 ug via INTRAVENOUS

## 2015-05-07 MED ORDER — ONDANSETRON HCL 4 MG/2ML IJ SOLN
INTRAMUSCULAR | Status: DC | PRN
  Administered 2015-05-07: 4 mg via INTRAVENOUS

## 2015-05-07 MED ORDER — PHENYLEPHRINE HCL 10 MG/ML IJ SOLN
INTRAMUSCULAR | Status: DC | PRN
  Administered 2015-05-07: 100 ug via INTRAVENOUS

## 2015-05-07 MED ORDER — DEXAMETHASONE SODIUM PHOSPHATE 4 MG/ML IJ SOLN
INTRAMUSCULAR | Status: DC | PRN
  Administered 2015-05-07: 10 mg via INTRAVENOUS

## 2015-05-07 MED ORDER — SODIUM CHLORIDE 0.9 % IJ SOLN
INTRAMUSCULAR | Status: AC
  Administered 2015-05-07: 12:00:00
  Filled 2015-05-07: qty 3

## 2015-05-07 MED ORDER — CEFAZOLIN SODIUM-DEXTROSE 2-3 GM-% IV SOLR
2.0000 g | INTRAVENOUS | Status: AC
  Administered 2015-05-07: 2 g via INTRAVENOUS

## 2015-05-07 MED ORDER — BUPIVACAINE 0.25 % ON-Q PUMP DUAL CATH 400 ML
INJECTION | Status: AC
  Filled 2015-05-07: qty 400

## 2015-05-07 MED ORDER — BISACODYL 10 MG RE SUPP: 10.0000 mg | Freq: Every day | RECTAL | Status: DC | PRN

## 2015-05-07 MED ORDER — BUPIVACAINE HCL (PF) 0.5 % IJ SOLN
INTRAMUSCULAR | Status: AC
  Filled 2015-05-07: qty 30

## 2015-05-07 MED ORDER — SUGAMMADEX SODIUM 200 MG/2ML IV SOLN
INTRAVENOUS | Status: DC | PRN
  Administered 2015-05-07: 250 mg via INTRAVENOUS

## 2015-05-07 MED ORDER — MORPHINE SULFATE 2 MG/ML IJ SOLN
1.0000 mg | INTRAMUSCULAR | Status: DC | PRN
  Administered 2015-05-07 (×2): 2 mg via INTRAVENOUS
  Administered 2015-05-07: 1 mg via INTRAVENOUS
  Administered 2015-05-08 (×4): 2 mg via INTRAVENOUS
  Filled 2015-05-07 (×7): qty 1

## 2015-05-07 MED ORDER — PROPOFOL 10 MG/ML IV BOLUS
INTRAVENOUS | Status: DC | PRN
  Administered 2015-05-07: 100 mg via INTRAVENOUS
  Administered 2015-05-07: 200 mg via INTRAVENOUS
  Administered 2015-05-07: 100 mg via INTRAVENOUS

## 2015-05-07 MED ORDER — OXYCODONE-ACETAMINOPHEN 5-325 MG PO TABS
1.0000 | ORAL_TABLET | ORAL | Status: DC | PRN
  Administered 2015-05-08 – 2015-05-09 (×6): 2 via ORAL
  Administered 2015-05-09: 1 via ORAL
  Administered 2015-05-09 (×2): 2 via ORAL
  Administered 2015-05-10: 1 via ORAL
  Administered 2015-05-10 (×3): 2 via ORAL
  Filled 2015-05-07 (×3): qty 2
  Filled 2015-05-07 (×2): qty 1
  Filled 2015-05-07 (×8): qty 2

## 2015-05-07 SURGICAL SUPPLY — 35 items
BAG BILE T-TUBES STRL (MISCELLANEOUS) IMPLANT
CANISTER SUCT 1200ML W/VALVE (MISCELLANEOUS) ×2 IMPLANT
CATH KIT ON-Q SILVERSOAK 5IN (CATHETERS) ×4 IMPLANT
CATH TRAY 16F METER LATEX (MISCELLANEOUS) ×2 IMPLANT
DRAPE LAPAROTOMY 100X77 ABD (DRAPES) ×2 IMPLANT
DRAPE LAPAROTOMY TRNSV 106X77 (MISCELLANEOUS) IMPLANT
DRSG TELFA 3X8 NADH (GAUZE/BANDAGES/DRESSINGS) ×2 IMPLANT
ELECT BLADE 6 FLAT ULTRCLN (ELECTRODE) ×2 IMPLANT
ELECT BLADE 6.5 EXT (BLADE) ×2 IMPLANT
ELECT CAUTERY BLADE 6.4 (BLADE) ×2 IMPLANT
GAUZE SPONGE 4X4 12PLY STRL (GAUZE/BANDAGES/DRESSINGS) ×2 IMPLANT
GLOVE BIO SURGEON STRL SZ 6 (GLOVE) ×8 IMPLANT
GLOVE INDICATOR 8.0 STRL GRN (GLOVE) ×8 IMPLANT
GOWN STRL REUS W/ TWL LRG LVL3 (GOWN DISPOSABLE) ×3 IMPLANT
GOWN STRL REUS W/ TWL XL LVL3 (GOWN DISPOSABLE) ×1 IMPLANT
GOWN STRL REUS W/TWL LRG LVL3 (GOWN DISPOSABLE) ×3
GOWN STRL REUS W/TWL XL LVL3 (GOWN DISPOSABLE) ×1
KIT RM TURNOVER STRD PROC AR (KITS) ×2 IMPLANT
LABEL OR SOLS (LABEL) IMPLANT
LIGASURE IMPACT 36 18CM CVD LR (INSTRUMENTS) ×2 IMPLANT
LIQUID BAND (GAUZE/BANDAGES/DRESSINGS) ×2 IMPLANT
PACK BASIN MAJOR ARMC (MISCELLANEOUS) ×2 IMPLANT
PAD GROUND ADULT SPLIT (MISCELLANEOUS) ×2 IMPLANT
STAPLER SKIN PROX 35W (STAPLE) ×2 IMPLANT
SUT CHROMIC 0 CT 1 (SUTURE) ×4 IMPLANT
SUT MAXON ABS #0 GS21 30IN (SUTURE) ×4 IMPLANT
SUT SILK 0 (SUTURE) ×2
SUT SILK 0 30XBRD TIE 6 (SUTURE) ×2 IMPLANT
SUT VIC AB 0 CT1 27 (SUTURE) ×3
SUT VIC AB 0 CT1 27XCR 8 STRN (SUTURE) ×3 IMPLANT
SUT VIC AB 2-0 CT1 (SUTURE) ×2 IMPLANT
SUT VIC AB 2-0 UR6 27 (SUTURE) ×2 IMPLANT
TRAY PREP VAG/GEN (MISCELLANEOUS) ×2 IMPLANT
TUBING ART PRESS 48 MALE/FEM (TUBING) IMPLANT
WATER STERILE IRR 1000ML POUR (IV SOLUTION) ×2 IMPLANT

## 2015-05-07 NOTE — Interval H&P Note (Signed)
History and Physical Interval Note:  05/07/2015 1:04 PM  Cassandra Munoz  has presented today for surgery, with the diagnosis of UTERINE LEIOMYOMA, CHRONIC ABDOMINAL PAIN,DYSPAREUNIA  The various methods of treatment have been discussed with the patient and family. After consideration of risks, benefits and other options for treatment, the patient has consented to  Procedure(s): HYSTERECTOMY ABDOMINAL (N/A) as a surgical intervention .  The patient's history has been reviewed, patient examined, no change in status, stable for surgery.  I have reviewed the patient's chart and labs.  Questions were answered to the patient's satisfaction.     Hassell Done A Kayla Weekes

## 2015-05-07 NOTE — H&P (View-Only) (Signed)
Patient ID: Cassandra Munoz, female   DOB: [birth year], 54 y.o.   MRN: [id number]   Pre-op- tah +/- bso- 05/07/2015- cPP and fibroids  Preoperative history and physical    The Patient is a 54 y.o. married white.female, para 0, menopausal,  scheduled for Total abdominal hysterectomy and bilateral salpingo-oophorectomy on 05/07/2015. Indications for procedure are symptomatic fibroid uterus.  Pertinent Gynecological History: No LMP recorded. Patient is postmenopausal. Menarche age 54. Cycles were monthly throughout her reproductive life. No history of dysmenorrhea. Patient always feels warm but does not note specific vasomotor symptoms. Last pap: normal Date: 2016.  Discussed Blood/Blood Products: yes   Menstrual History: OB History    No data available      Menarche age: 54  Patient's last menstrual period was 05/01/2013.    Past Medical History  Diagnosis Date  . Psoriasis   . Enlarged uterus   . Enlarged uterus   . Abnormal glucose   . Vitamin D deficiency   . Elevated cholesterol with elevated triglycerides   . Psoriasis   . Diabetes mellitus without complication     Past Surgical History  Procedure Laterality Date  . Robotic assisted laparoscopic vaginal hysterectomy with fibroid removal      Fibroid removal  . Tonsillectomy and adenoidectomy    . Ankle arthroscopy Right     OB History  No data available    History   Social History  . Marital Status: Married    Spouse Name: N/A  . Number of Children: N/A  . Years of Education: N/A   Social History Main Topics  . Smoking status: Never Smoker   . Smokeless tobacco: Not on file  . Alcohol Use: 1.2 oz/week    2 Standard drinks or equivalent per week  . Drug Use: No  . Sexual Activity: Yes   Other Topics Concern  . 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 Diabetes Mother   . Hypertension Mother   . Colon cancer Father   . Heart disease Father   . Hypertension Father   . Breast cancer Paternal Aunt     times 3     (Not in a hospital admission)  Allergies  Allergen Reactions  . Codeine Nausea Only    Other Reaction: oversedation  . Clobetasol Rash    Review of Systems Constitutional: No recent fever/chills/sweats Respiratory: No recent cough/bronchitis Cardiovascular: No chest pain Gastrointestinal: No recent nausea/vomiting/diarrhea Genitourinary: no UTI symptoms Hematologic/lymphatic:No history of coagulopathy or recent blood thinner use    Objective:    BP 118/79 mmHg  Pulse 98  Ht 5\' 5"  (1.651 m)  Wt 256 lb 1.6 oz (116.166 kg)  BMI 42.62 kg/m2  LMP 05/01/2013  General:   Normal  Skin:   normal  HEENT:  Normal  Neck:  Supple without Adenopathy or Thyromegaly  Lungs:   Heart:              Breasts:   Abdomen:  Pelvis:  M/S   Extremeties:  Neuro:    clear to auscultation bilaterally   Normal without murmur   Not Examined   soft, non-tender; bowel sounds normal; no masses,  no organomegaly   Exam deferred to OR  No  CVAT  Warm/Dry   Normal          Assessment:    1. Uterine leiomyoma, symptomatic; 16 weeks size 2. Chronic abdominal pain and dyspareunia, secondary to fibroids   Plan:   1.  TAH/BSO.  Counseling note the patient is to undergo total abdominal hysterectomy, bilateral salpingo-oophorectomy through a midline incision for symptomatic fibroid uterus.  She is understanding of the planned procedure and is aware of and is accepting of all surgical risks which include but are not limited to bleeding, infection, pelvic organs with need for repair, blood disorders, anesthesia risks, etc.  She understands that she may require ERT therapy for control of vasomotor symptoms and or for prevention of osteoporosis.  The on cQ pump will be utilized per  her request. All questions have been answered.  Informed consent is given.  Patient is ready and willing to proceed with surgery as scheduled.  Date of Initial H&P: 05/02/2015  History reviewed, patient examined, no change in status, stable for surgery.

## 2015-05-07 NOTE — Anesthesia Procedure Notes (Signed)
Procedure Name: Intubation Date/Time: 05/07/2015 1:41 PM Performed by: Aline Brochure Pre-anesthesia Checklist: Patient identified, Emergency Drugs available, Suction available and Patient being monitored Patient Re-evaluated:Patient Re-evaluated prior to inductionOxygen Delivery Method: Circle system utilized Preoxygenation: Pre-oxygenation with 100% oxygen Intubation Type: IV induction and Cricoid Pressure applied Ventilation: Mask ventilation with difficulty and Oral airway inserted - appropriate to patient size Laryngoscope Size: Glidescope and 4 Grade View: Grade IV Tube type: Oral Tube size: 7.0 mm Number of attempts: 5 or more Airway Equipment and Method: Oral airway,  Fiberoptic brochoscope and Video-laryngoscopy Placement Confirmation: ETT inserted through vocal cords under direct vision,  positive ETCO2 and breath sounds checked- equal and bilateral Secured at: 22 cm Tube secured with: Tape Dental Injury: Teeth and Oropharynx as per pre-operative assessment  Difficulty Due To: Difficulty was anticipated and Difficult Airway- due to anterior larynx Future Recommendations: Recommend- induction with short-acting agent, and alternative techniques readily available and Recommend- awake intubation Comments: See Quick notes! Anticipated challenging airway.

## 2015-05-07 NOTE — Anesthesia Postprocedure Evaluation (Signed)
  Anesthesia Post-op Note  Patient: Cassandra Munoz  Procedure(s) Performed: Procedure(s): HYSTERECTOMY ABDOMINAL (N/A)  Anesthesia type:General  Patient location: PACU  Post pain: Pain level controlled  Post assessment: Post-op Vital signs reviewed, Patient's Cardiovascular Status Stable, Respiratory Function Stable, Patent Airway and No signs of Nausea or vomiting  Post vital signs: Reviewed and stable  Last Vitals:  Filed Vitals:   05/07/15 1625  BP:   Pulse:   Temp:   Resp: 14    Level of consciousness: awake, alert  and patient cooperative  Complications: No apparent anesthesia complications

## 2015-05-07 NOTE — Transfer of Care (Signed)
Immediate Anesthesia Transfer of Care Note  Patient: Cassandra Munoz  Procedure(s) Performed: Procedure(s): HYSTERECTOMY ABDOMINAL (N/A)  Patient Location: PACU  Anesthesia Type:General  Level of Consciousness: awake, alert  and oriented  Airway & Oxygen Therapy: Patient Spontanous Breathing and Patient connected to face mask oxygen  Post-op Assessment: Report given to RN and Post -op Vital signs reviewed and stable  Post vital signs: stable  Last Vitals:  Filed Vitals:   05/07/15 1600  BP: 129/77  Pulse: 104  Temp: 36.6 C  Resp: 23    Complications: No apparent anesthesia complications

## 2015-05-07 NOTE — Anesthesia Preprocedure Evaluation (Addendum)
Anesthesia Evaluation  Patient identified by MRN, date of birth, ID band Patient awake    Reviewed: Allergy & Precautions, NPO status , Patient's Chart, lab work & pertinent test results  Airway Mallampati: IV  TM Distance: >3 FB Neck ROM: Full   Comment: We will have the glidescope in the room. Dental  (+) Teeth Intact   Pulmonary  breath sounds clear to auscultation        Cardiovascular negative cardio ROS Normal cardiovascular examRhythm:Regular Rate:Normal  BMI 42.7.   Neuro/Psych    GI/Hepatic negative GI ROS, Neg liver ROS,   Endo/Other  diabetes, Type 2BG 140.  Renal/GU negative Renal ROS     Musculoskeletal   Abdominal (+) + obese,  Abdomen: soft.    Peds  Hematology negative hematology ROS (+)   Anesthesia Other Findings   Reproductive/Obstetrics                            Anesthesia Physical Anesthesia Plan  ASA: III  Anesthesia Plan: General   Post-op Pain Management:    Induction: Intravenous  Airway Management Planned: Oral ETT  Additional Equipment:   Intra-op Plan:   Post-operative Plan: Extubation in OR  Informed Consent: I have reviewed the patients History and Physical, chart, labs and discussed the procedure including the risks, benefits and alternatives for the proposed anesthesia with the patient or authorized representative who has indicated his/her understanding and acceptance.     Plan Discussed with: CRNA  Anesthesia Plan Comments:         Anesthesia Quick Evaluation

## 2015-05-08 LAB — HEMOGLOBIN: Hemoglobin: 13.3 g/dL (ref 12.0–16.0)

## 2015-05-08 MED ORDER — HYDROMORPHONE HCL 2 MG PO TABS: 1.0000 mg | ORAL_TABLET | Freq: Four times a day (QID) | ORAL | Status: DC | PRN

## 2015-05-08 MED ORDER — IBUPROFEN 800 MG PO TABS
800.0000 mg | ORAL_TABLET | Freq: Three times a day (TID) | ORAL | Status: DC
  Administered 2015-05-08 – 2015-05-10 (×7): 800 mg via ORAL
  Filled 2015-05-08 (×7): qty 1

## 2015-05-08 NOTE — Op Note (Signed)
OPERATIVE NOTE:  Cassandra Munoz PROCEDURE DATE: 05/08/2015   PREOPERATIVE DIAGNOSIS: fibroid uterus 16 weeks size POSTOPERATIVE DIAGNOSIS: fibroid uterus 16 weeks size PROCEDURE: abdominal supracervical hysterectomy with BSO  SURGEON:  Brayton Mars, MD ASSISTANTS: Dr. Marcelline Mates ANESTHESIA: General INDICATIONS: 54 y.o. No obstetric history on file.   FINDINGS:  Multi-fibroid uterus; grossly normal tubes and ovaries; difficult intubation   I/O's:   COUNTS:  YES SPECIMENS: uterus without cervix, bilateral fallopian tubes and ovaries ANTIBIOTIC PROPHYLAXIS:Ancef 2 grams COMPLICATIONS: None immediate  PROCEDURE IN DETAIL: The patient was brought to the operating room where she was placed in the supine position.  General endotracheal anesthesia was induced.  Intubation process was difficult and see anesthesia notes for details of intubation. A ChloraPrep and Betadine abdominal, perineal, intravaginal prep and drape was performed in standard fashion.  A Foley catheter was placed and was draining clear yellow urine from the bladder. Timeout was performed. Midline incision was made from just inferior to the umbilicus to just above the symphysis pubis.  The midline raphae was identified and separated allowing access to the peritoneal cavity.  A self-retaining retractor was placed.  The bowel was packed off with laps along with the assistance of a malleable retractor. The above-noted findings in the pelvis were made.  Decision was made to proceed with supracervical hysterectomy because of anatomic limitations of the patient with the intent of minimizing surgical morbidity. The uterine cornu were grasped with Coker clamps.  The LigaSure impact instrument was used to facilitate taking down ligaments and pedicles.  Sequentially, the round ligaments were coagulated and transected.  The utero-ovarian ligaments were grasped, coagulated and transected.  The cardinal, broad ligament complexes were  taken down in similar fashion.  The bladder flap was created through sharp and blunt dissection using Metzenbaum scissors.  The uterine arteries were skeletonized, coagulated and cut with the LigaSure instrument.  Because of the patient's size and desire to minimize potential morbidity involved loss and potential compromise of the ureters, decision was made to transect the cervix and removed multi-fibroid uterus.  This was performed with scalpel.  The cervical stump was oversewn with 0 Vicryl suture.  Ureters were noted to be out of harm's way.  The ovaries and tubes were then removed with the aid of the LigaSure impact instrument.  Once satisfied with hemostasis, the pelvis was copiously irrigated.  All instrumentation and packing was removed from the abdominal pelvic cavity.  The abdomen was closed in layers using Maxon on the fascia in a simple running manner.  Subcutaneous tissues were reapproximated using 2-0 Vicryl suture.  The on cue pump was placed in standard fashion to provide postoperative analgesia.  The skin was closed with staples.  Pressure dressing was applied.  Patient was then awakened, extubated and taken to recovery room in satisfactory condition. Estimated blood loss was 150 mL. Urine output was 100 mL. IV fluids-quantity not available at time of dictation. Also sponge counts were verified as correct. The patient did receive Ancef antibiotic prophylaxis.  Cassandra Truss A. Zipporah Plants, MD, ACO[facility]

## 2015-05-08 NOTE — Progress Notes (Signed)
1 Day Post-Op Procedure: Abdominal Supracervical Hysterectomy BSO  Subjective: Patient reports tolerating PO and no problems voiding.    Objective: I have reviewed patient's vital signs, intake and output, medications and labs.  General: alert and cooperative GI: soft, non-tender; bowel sounds normal; no masses,  no organomegaly and incision: clean, dry and intact Extremities: no edema, redness or tenderness in the calves or thighs Vaginal Bleeding: none  Assessment: s/p Procedure(s): HYSTERECTOMY ABDOMINAL (N/A): stable  Plan: Advance diet Encourage ambulation Advance to PO medication Discontinue IV fluids  LOS: 1 day    Cassandra Munoz 05/08/2015, 1:45 PM

## 2015-05-09 MED ORDER — ONDANSETRON 4 MG PO TBDP
4.0000 mg | ORAL_TABLET | ORAL | Status: DC | PRN
  Administered 2015-05-09: 4 mg via ORAL
  Filled 2015-05-09 (×4): qty 1

## 2015-05-09 NOTE — Progress Notes (Signed)
2 Days Post-Op Procedure(s) (LRB): HYSTERECTOMY ABDOMINAL (N/A)  Subjective: Patient reports tolerating PO, + flatus and no problems voiding.    Objective: I have reviewed patient's vital signs, intake and output and medications.  General: alert and cooperative Resp: no SOB Cardio: regular rate and rhythm GI: soft, non-tender; bowel sounds normal; no masses,  no organomegaly and incision: clean, dry, intact and On Q pump present Extremities: no edema, redness or tenderness in the calves or thighs Vaginal Bleeding: none  Assessment: s/p Abdominal Supracervical Hysterectomy BSO: progressing well  Plan: Possible D/C home later today.  LOS: 2 days    Alanda Slim Halea Lieb 05/09/2015, 7:40 AM

## 2015-05-09 NOTE — Progress Notes (Signed)
   05/09/15 0900  Clinical Encounter Type  Visited With Patient  Referral From Nurse  Spiritual Encounters  Spiritual Needs Prayer  Stress Factors  Patient Stress Factors Health changes  Chaplain Marcello Moores engaged with patient and offered prayer and support. Chaplain Marcello Moores

## 2015-05-10 MED ORDER — OXYCODONE-ACETAMINOPHEN 5-325 MG PO TABS: 1.0000 | ORAL_TABLET | ORAL | Status: DC | PRN

## 2015-05-10 MED ORDER — IBUPROFEN 800 MG PO TABS: 800.0000 mg | ORAL_TABLET | Freq: Three times a day (TID) | ORAL | Status: DC

## 2015-05-10 MED ORDER — ONDANSETRON 4 MG PO TBDP: 4.0000 mg | ORAL_TABLET | ORAL | Status: DC | PRN

## 2015-05-10 NOTE — Discharge Summary (Signed)
Physician Discharge Summary  Patient ID: KYRI SHADER MRN: [id number] DOB/AGE: 06-23-1961 54 y.o.  Admit date: 05/07/2015 Discharge date: 05/10/2015  Admission Diagnoses: Fibroids  Discharge Diagnoses:  Fibroids  Operative Procedures:  Abdominal Supracervical Hysterectomy with [facility] Hospital Course: Uncomplicated    Significant Diagnostic Studies:  Lab Results  Component Value Date   HGB 13.3 05/08/2015   HGB 14.0 05/02/2015   Lab Results  Component Value Date   HCT 42.3 05/02/2015   CBC Latest Ref Rng 05/08/2015 05/02/2015  WBC 3.6 - 11.0 K/uL - 6.5  Hemoglobin 12.0 - 16.0 g/dL 13.3 14.0  Hematocrit 35.0 - 47.0 % - 42.3  Platelets 150 - 440 K/uL - 256     Discharged Condition: good  Discharge Exam: Blood pressure 119/68, pulse 75, temperature 97.4 F (36.3 C), temperature source Axillary, resp. rate 18, height 5\' 5"  (1.651 m), weight 256 lb (116.121 kg), SpO2 98 %. Incision/Wound: midline incision well approximated with staples; no erythema, induration, or drainage.  Disposition:  1.  Home with postoperative precautions. 2.  Return 1 week postop for staple removal. 3.  Remove On Q pump as directed 4.   Follow-up as needed for fever greater than 100.5, severe abdominal pain with nausea and vomiting with inability to keep food or fluids down; incisional drainage; no heavy lifting greater than 10 pounds    Medication List    ASK your doctor about these medications        Apremilast 30 MG Tabs  Take 2 mg by mouth daily.     ergocalciferol 50000 UNITS capsule  Commonly known as:  VITAMIN D2  Take 50,000 Units by mouth 2 (two) times a week.     glucose blood test strip  Commonly known as:  ONETOUCH VERIO  Use to check blood sugar 2 times per day     Insulin Glargine 100 UNIT/ML Solostar Pen  Commonly known as:  LANTUS SOLOSTAR  Inject 15 Units into the skin at bedtime.     Insulin Pen Needle 32G X 4 MM Misc  Commonly known as:  BD PEN NEEDLE NANO U/F  Use  daily     ONE TOUCH LANCETS Misc  Use to check blood sugar 2 times per day           Follow-up Information    Follow up with Brayton Mars, MD In 1 week.   Specialties:  Obstetrics and Gynecology, Radiology   Why:  Post Op Check   Contact information:   [address] [place] [address] [phone]       Signed: Alanda Slim Norvella Loscalzo 05/10/2015, 9:25 AM

## 2015-05-10 NOTE — Progress Notes (Signed)
Pt discharged to home with husband.  To car via W/C

## 2015-05-11 LAB — SURGICAL PATHOLOGY

## 2015-05-15 ENCOUNTER — Ambulatory Visit (INDEPENDENT_AMBULATORY_CARE_PROVIDER_SITE_OTHER): Payer: 59 | Admitting: Obstetrics and Gynecology

## 2015-05-15 ENCOUNTER — Encounter: Payer: Self-pay | Admitting: Obstetrics and Gynecology

## 2015-05-15 VITALS — BP 123/78 | HR 79 | Ht 65.0 in | Wt 254.6 lb

## 2015-05-15 DIAGNOSIS — D279 Benign neoplasm of unspecified ovary: Secondary | ICD-10-CM

## 2015-05-15 DIAGNOSIS — D259 Leiomyoma of uterus, unspecified: Secondary | ICD-10-CM

## 2015-05-15 DIAGNOSIS — N800 Endometriosis of uterus: Secondary | ICD-10-CM

## 2015-05-15 DIAGNOSIS — N809 Endometriosis, unspecified: Secondary | ICD-10-CM

## 2015-05-15 DIAGNOSIS — N8003 Adenomyosis of the uterus: Secondary | ICD-10-CM

## 2015-05-15 DIAGNOSIS — Z09 Encounter for follow-up examination after completed treatment for conditions other than malignant neoplasm: Secondary | ICD-10-CM

## 2015-05-15 MED ORDER — OXYCODONE-ACETAMINOPHEN 5-325 MG PO TABS: 1.0000 | ORAL_TABLET | Freq: Four times a day (QID) | ORAL | Status: DC | PRN

## 2015-05-15 NOTE — Addendum Note (Signed)
Addended by: Elouise Munroe on: 05/15/2015 03:04 PM   Modules accepted: Orders

## 2015-05-15 NOTE — Progress Notes (Signed)
Patient ID: Cassandra Munoz, female   DOB: [birth year], 54 y.o.   MRN: [id number] S: 1 week post op tah/bso Percocet as needed ibup bid Bladder/bowel function normal Slight draining from incision The analgesic catheters were removed several days ago without incident.  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  Normal 1 week postop check status post abdominal supracervical hysterectomy with BSO. 2.  Pathology: Fibroids, adenomyosis, serous cystadenoma of ovary  P: 1.  Routine postoperative precautions. 2.  Percocet refilled. 3.  Return in 5 weeks for final postop check

## 2015-05-29 ENCOUNTER — Encounter: Payer: Self-pay | Admitting: Internal Medicine

## 2015-05-29 ENCOUNTER — Ambulatory Visit (INDEPENDENT_AMBULATORY_CARE_PROVIDER_SITE_OTHER): Payer: 59 | Admitting: Internal Medicine

## 2015-05-29 ENCOUNTER — Other Ambulatory Visit (INDEPENDENT_AMBULATORY_CARE_PROVIDER_SITE_OTHER): Payer: 59 | Admitting: *Deleted

## 2015-05-29 VITALS — BP 122/64 | HR 114 | Temp 97.7°F | Resp 12 | Wt 252.6 lb

## 2015-05-29 DIAGNOSIS — E1165 Type 2 diabetes mellitus with hyperglycemia: Secondary | ICD-10-CM | POA: Diagnosis not present

## 2015-05-29 LAB — POCT GLYCOSYLATED HEMOGLOBIN (HGB A1C): Hemoglobin A1C: 8

## 2015-05-29 MED ORDER — CANAGLIFLOZIN 100 MG PO TABS: 100.0000 mg | ORAL_TABLET | Freq: Every day | ORAL | Status: DC

## 2015-05-29 NOTE — Patient Instructions (Signed)
Continue Lantus 20 units at bedtime. Start Invokana 100 mg daily in am, before breakfast.  Please return in 1.5 month with your sugar log.

## 2015-05-29 NOTE — Progress Notes (Signed)
Patient ID: Cassandra Munoz, female   DOB: [birth year], 54 y.o.   MRN: [id number]  HPI: Cassandra Munoz is a 54 y.o.-year-old female, initially referred by [name] [facility]), for management of DM2, 2012, insulin-dependent, uncontrolled, without complications. Last visit 2 mo ago.  Since last visit, she had hysterectomy (fibroids).  She started to improve her diet since last visit >> decreased carb intake.  Last hemoglobin A1c was: Lab Results  Component Value Date   HGBA1C 11.3* 02/20/2015  2012: HbA1c 7%  She is on: - Lantus 15 >> 20 units - added 03/2015.  Pt checks her sugars 1-3x a day. - am: n/c >> 143-181 - 2h after b'fast: n/c - before lunch: n/c >> 119-183 - 2h after lunch: n/c >> 119, 179 - before dinner: n/c >> 114-142 - 2h after dinner: n/c >> 130, 147-244 - bedtime: n/c >> 149-229 - nighttime: n/c  ? hypoglycemia awareness. Highest sugar was 264 (nonfasting)  Glucometer: OneTouch  Not drinking sodas anymore.  - no CKD, last BUN/creatinine:  02/20/2015: 11/0.73.  - last set of lipids: Lab Results  Component Value Date   CHOL 226* 02/20/2015   LDLCALC 142 02/20/2015   TRIG 177* 02/20/2015   - last eye exam was in 01/2015.No DR. - no numbness and tingling in her feet.  She also has a h/o mild HTG: 177 on 02/20/2015 (nonfasting). LDL is high for a pt with DM: 142.  She also has a h/o vit D def (9.8 on 02/15/2015) >> started on Ergocalciferol by PCP on 02/22/2015.  ROS: Constitutional: no weight gain/loss, no fatigue, no subjective hyperthermia/hypothermia Eyes: no blurry vision, no xerophthalmia ENT: no sore throat, no nodules palpated in throat, no dysphagia/odynophagia, no hoarseness Cardiovascular: no CP/SOB/palpitations/leg swelling Respiratory: no cough/SOB Gastrointestinal: no N/V/D/C Musculoskeletal: no muscle/joint aches Skin: no rashes Neurological: no tremors/numbness/tingling/dizziness  I reviewed pt's medications, allergies, PMH, social  hx, family hx, and changes were documented in the history of present illness. Otherwise, unchanged from my initial visit note.  Past Medical History  Diagnosis Date  . Psoriasis   . Enlarged uterus   . Enlarged uterus   . Abnormal glucose   . Vitamin D deficiency   . Elevated cholesterol with elevated triglycerides   . Psoriasis   . Diabetes mellitus without complication    Past Surgical History  Procedure Laterality Date  . Robotic assisted laparoscopic vaginal hysterectomy with fibroid removal      Fibroid removal  . Tonsillectomy and adenoidectomy    . Ankle arthroscopy Right   . Abdominal hysterectomy N/A 05/07/2015    Procedure: HYSTERECTOMY ABDOMINAL;  Surgeon: Brayton Mars, MD;  Location: [facility] ORS;  Service: Gynecology;  Laterality: N/A;   History   Social History Main Topics  . Smoking status: Never Smoker   . Smokeless tobacco: Not on file  . Alcohol Use: 0.0 oz/week    0 Standard drinks or equivalent per week  . Drug Use: No  . 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 Apremilast 30 MG TABS Take 2 mg by mouth daily.    . ergocalciferol (VITAMIN D2) 50000 UNITS capsule Take 50,000 Units by mouth 2 (two) times a week.    Marland Kitchen glucose blood (  ONETOUCH VERIO) test strip Use to check blood sugar 2 times per day 100 each 12  . ibuprofen (ADVIL,MOTRIN) 800 MG tablet Take 1 tablet (800 mg total) by mouth every 8 (eight) hours. 30 tablet 0  . Insulin Glargine (LANTUS SOLOSTAR) 100 UNIT/ML Solostar Pen Inject 15 Units into the skin at bedtime. (Patient taking differently: Inject 20 Units into the skin at bedtime. ) 5 pen 1  . Insulin Pen Needle (BD PEN NEEDLE NANO U/F) 32G X 4 MM MISC Use daily 100 each 1  . ONE TOUCH LANCETS MISC Use  to check blood sugar 2 times per day 200 each 1  . oxyCODONE-acetaminophen (PERCOCET/ROXICET) 5-325 MG per tablet Take 1-2 tablets by mouth every 4 (four) hours as needed (moderate to severe pain (when tolerating fluids)). (Patient not taking: Reported on 05/29/2015) 30 tablet 0  . oxyCODONE-acetaminophen (PERCOCET/ROXICET) 5-325 MG per tablet Take 1-2 tablets by mouth every 6 (six) hours as needed. (Patient not taking: Reported on 05/29/2015) 30 tablet 0   No current facility-administered medications on file prior to visit.   Allergies  Allergen Reactions  . Clobetasol Rash   Family History  Problem Relation Age of Onset  . Diabetes Mother   . Hypertension Mother   . Colon cancer Father   . Heart disease Father   . Hypertension Father   . Breast cancer Paternal Aunt     times 3   PE: BP 122/64 mmHg  Pulse 114  Temp(Src) 97.7 F (36.5 C) (Oral)  Resp 12  Wt 252 lb 9.6 oz (114.579 kg)  SpO2 97%  LMP 05/01/2013 Body mass index is 42.03 kg/(m^2). Wt Readings from Last 3 Encounters:  05/29/15 252 lb 9.6 oz (114.579 kg)  05/15/15 254 lb 9.6 oz (115.486 kg)  05/07/15 256 lb (116.121 kg)   Constitutional: obese, in NAD Eyes: PERRLA, EOMI, no exophthalmos ENT: moist mucous membranes, no thyromegaly, no cervical lymphadenopathy Cardiovascular: tachycardia, RR, No MRG Respiratory: CTA B Gastrointestinal: abdomen soft, NT, ND, BS+ Musculoskeletal: no deformities, strength intact in all 4 Skin: moist, warm, no rashes Neurological: no tremor with outstretched hands, DTR normal in all 4  ASSESSMENT: 1. DM2, new dx  2. HL  PLAN:  1. Patient with a 4 year h/o DM2. At last visit, we discussed about possible acute/chronic complications if she does not achieve good control. We also discussed about the importance of diet and I made specific suggestions about improving her diet >> she cut down carbs and is eating healthier. Also, we started Lantus insulin.  Her sugars are markedly improved  since last visit! I hope we can decrease the Lantus and maybe even stop it in the near future. For now, we can add Invokana to help with weight and with postprandial sugars. She refuses Metformin as she read online about possible SEs. - I suggested to:  Patient Instructions  Continue Lantus 20 units at bedtime. Start Invokana 100 mg daily in am, before breakfast.  Please return in 1.5 month with your sugar log.  - we discussed about SEs of Invokana, which are: dizziness (advised to be careful when stands from sitting position), decreased BP - usually not < normal (BP today is not low), and fungal UTIs (advised to let me know if develops one).  - continue checking sugars at different times of the day - check 1-2 times a day, rotating checks  - advised for yearly eye exams >> she is UTD - Return to clinic in 1.5 mo with sugar  log  2. HL - Mild elevation in triglycerides, which is expected in the setting of uncontrolled diabetes; insulin will help - High LDL - we may need to start a statin if this does not improve after her diabetes improves. - check lipids at next visit

## 2015-06-19 ENCOUNTER — Encounter: Payer: 59 | Admitting: Obstetrics and Gynecology

## 2015-06-19 ENCOUNTER — Encounter: Payer: Self-pay | Admitting: Obstetrics and Gynecology

## 2015-06-19 ENCOUNTER — Ambulatory Visit (INDEPENDENT_AMBULATORY_CARE_PROVIDER_SITE_OTHER): Payer: 59 | Admitting: Obstetrics and Gynecology

## 2015-06-19 VITALS — BP 117/78 | HR 108 | Ht 65.0 in | Wt 250.9 lb

## 2015-06-19 DIAGNOSIS — E8940 Asymptomatic postprocedural ovarian failure: Secondary | ICD-10-CM

## 2015-06-19 DIAGNOSIS — Z90711 Acquired absence of uterus with remaining cervical stump: Secondary | ICD-10-CM

## 2015-06-19 DIAGNOSIS — N958 Other specified menopausal and perimenopausal disorders: Secondary | ICD-10-CM

## 2015-06-19 MED ORDER — ESTRADIOL 1 MG PO TABS: 1.0000 mg | ORAL_TABLET | Freq: Every day | ORAL | Status: DC

## 2015-06-19 NOTE — Patient Instructions (Signed)
1.  Begin estradiol 1 mg daily 2.  May resume all activities without restriction. 3.  Return in 3 months for follow-up on ERT

## 2015-06-19 NOTE — Progress Notes (Signed)
Patient ID: Cassandra Munoz, female   DOB: 03/22/1961, 54 y.o.   MRN: [id number] 5 week post op- s/p tah/ bso Pt c/o of a tingling sensation across the abd at times  Chief complaint: 1.  Final postop check. 2.  Status post abdominal supracervical hysterectomy, BSO.  Patient is doing well.  A 6 week postop.  Bowel and bladder function are normal.  She is not experiencing any significant pain, which would require analgesics.  She does have occasional tingling across the abdomen.  She denies vaginal bleeding or discharge.  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 Back: No CVA tenderness. Abdomen: Midline incision well healed; soft, nontender, without organomegaly; no hernia. Pelvic exam: External genitalia-normal BUS-normal. Vagina-normal estrogen effect; good vault support. Cervix-well supported; no cervical motion tenderness Uterus-surgically absent Adnexa-nonpalpable and nontender. Rectovaginal-normal.  External exam.  IMPRESSION: 1.  Normal postop check status post supracervical abdominal hysterectomy, BSO 2.  Possible mild vasomotor symptoms   PLAN: 1.  Resume all activities without restriction. 2.  Begin a trial of estradiol 1 mg daily. 3.  Return in 3 months for follow-up.  Brayton Mars, MD

## 2015-07-05 ENCOUNTER — Other Ambulatory Visit: Payer: Self-pay | Admitting: Obstetrics and Gynecology

## 2015-07-05 DIAGNOSIS — E119 Type 2 diabetes mellitus without complications: Secondary | ICD-10-CM

## 2015-07-17 ENCOUNTER — Ambulatory Visit (INDEPENDENT_AMBULATORY_CARE_PROVIDER_SITE_OTHER): Payer: 59 | Admitting: Internal Medicine

## 2015-07-17 ENCOUNTER — Encounter: Payer: Self-pay | Admitting: Internal Medicine

## 2015-07-17 VITALS — BP 124/66 | HR 109 | Temp 98.0°F | Resp 12 | Wt 253.0 lb

## 2015-07-17 DIAGNOSIS — E1165 Type 2 diabetes mellitus with hyperglycemia: Secondary | ICD-10-CM

## 2015-07-17 MED ORDER — METFORMIN HCL ER 500 MG PO TB24: 1000.0000 mg | ORAL_TABLET | Freq: Every day | ORAL | Status: DC

## 2015-07-17 NOTE — Progress Notes (Addendum)
Patient ID: Cassandra Munoz, female   DOB: October 14, 1961, 54 y.o.   MRN: [id number]  HPI: Cassandra Munoz is a 54 y.o.-year-old female, initially referred by [name] [facility]), for management of DM2, 2012, insulin-dependent, uncontrolled, without complications. Last visit 1.5 mo ago.  Last hemoglobin A1c was: Lab Results  Component Value Date   HGBA1C 8.0 05/29/2015   HGBA1C 11.3* 02/20/2015  2012: HbA1c 7%  She is on: - Lantus 15 >> 20 units - added 03/2015. - Invokana 100 mg daily  - added 05/2015  Pt checks her sugars 1-3x a day. - am: n/c >> 143-181 >> 150-170, 185 - 2h after b'fast: n/c - before lunch: n/c >> 119-183 >> 116-156 - 2h after lunch: n/c >> 119, 179 >> n/c - before dinner: n/c >> 114-142 >> 112-139 - 2h after dinner: n/c >> 130, 147-244 >> n/c - bedtime: n/c >> 149-229 >> 143-165 - nighttime: n/c  ? hypoglycemia awareness. Highest sugar was 264 (nonfasting) >> 180  Glucometer: OneTouch  - no CKD, last BUN/creatinine:  02/20/2015: 11/0.73.  - last set of lipids: Lab Results  Component Value Date   CHOL 226* 02/20/2015   LDLCALC 142 02/20/2015   TRIG 177* 02/20/2015   - last eye exam was in 01/2015.No DR. - no numbness and tingling in her feet.  She also has a h/o mild HTG: 177 on 02/20/2015 (nonfasting). LDL is high for a pt with DM: 142.  She also has a h/o vit D def (9.8 on 02/15/2015) >> started on Ergocalciferol by PCP on 02/22/2015.  She started Low dose estradiol, But will stop soon, since she did not feel better.   ROS: Constitutional: no weight gain/loss, no fatigue, no subjective hyperthermia/hypothermia, + poor sleep Eyes: no blurry vision, no xerophthalmia ENT: no sore throat, no nodules palpated in throat, no dysphagia/odynophagia, no hoarseness Cardiovascular: no CP/SOB/palpitations/leg swelling Respiratory: no cough/SOB Gastrointestinal: no N/V/D/C Musculoskeletal: no muscle/joint aches Skin: no rashes Neurological: no  tremors/numbness/tingling/dizziness  I reviewed pt's medications, allergies, PMH, social hx, family hx, and changes were documented in the history of present illness. Otherwise, unchanged from my initial visit note.  Past Medical History  Diagnosis Date  . Psoriasis   . Enlarged uterus   . Enlarged uterus   . Abnormal glucose   . Vitamin D deficiency   . Elevated cholesterol with elevated triglycerides   . Psoriasis   . Diabetes mellitus without complication    Past Surgical History  Procedure Laterality Date  . Robotic assisted laparoscopic vaginal hysterectomy with fibroid removal      Fibroid removal  . Tonsillectomy and adenoidectomy    . Ankle arthroscopy Right   . Abdominal hysterectomy N/A 05/07/2015    Procedure: HYSTERECTOMY ABDOMINAL;  Surgeon: Brayton Mars, MD;  Location: [facility] ORS;  Service: Gynecology;  Laterality: N/A;   History   Social History Main Topics  . Smoking status: Never Smoker   . Smokeless tobacco: Not on file  . Alcohol Use: 0.0 oz/week    0 Standard drinks or equivalent per week  . Drug Use: No  . 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 Apremilast 30 MG TABS Take 2 mg by mouth daily.    Marland Kitchen  canagliflozin (INVOKANA) 100 MG TABS tablet Take 1 tablet (100 mg total) by mouth daily. 30 tablet 2  . ergocalciferol (VITAMIN D2) 50000 UNITS capsule Take 50,000 Units by mouth 2 (two) times a week.    . estradiol (ESTRACE) 1 MG tablet Take 1 tablet (1 mg total) by mouth daily. 30 tablet 12  . glucose blood (ONETOUCH VERIO) test strip Use to check blood sugar 2 times per day 100 each 12  . Insulin Glargine (LANTUS SOLOSTAR) 100 UNIT/ML Solostar Pen Inject 15 Units into the skin at bedtime. (Patient taking  differently: Inject 20 Units into the skin at bedtime. ) 5 pen 1  . Insulin Pen Needle (BD PEN NEEDLE NANO U/F) 32G X 4 MM MISC Use daily 100 each 1  . ONE TOUCH LANCETS MISC Use to check blood sugar 2 times per day 200 each 1   No current facility-administered medications on file prior to visit.   Allergies  Allergen Reactions  . Clobetasol Rash   Family History  Problem Relation Age of Onset  . Diabetes Mother   . Hypertension Mother   . Colon cancer Father   . Heart disease Father   . Hypertension Father   . Breast cancer Paternal Aunt     times 3   PE: BP 124/66 mmHg  Pulse 109  Temp(Src) 98 F (36.7 C) (Oral)  Resp 12  Wt 253 lb (114.76 kg)  SpO2 96%  LMP 05/01/2013 Body mass index is 42.1 kg/(m^2). Wt Readings from Last 3 Encounters:  07/17/15 253 lb (114.76 kg)  06/19/15 250 lb 14.4 oz (113.807 kg)  05/29/15 252 lb 9.6 oz (114.579 kg)   Constitutional: obese, in NAD Eyes: PERRLA, EOMI, no exophthalmos ENT: moist mucous membranes, no thyromegaly, no cervical lymphadenopathy Cardiovascular: tachycardia, RR, No MRG Respiratory: CTA B Gastrointestinal: abdomen soft, NT, ND, BS+ Musculoskeletal: no deformities, strength intact in all 4 Skin: moist, warm, no rashes Neurological: no tremor with outstretched hands, DTR normal in all 4  ASSESSMENT: 1. DM2, insulin-dependent  PLAN:  1. Patient with a 4 year h/o DM2. Her sugars markedly improved after adding Lantus before last visit. At last visit, we also added Invokana, which works well, but the sugars are still not quite at goal. Her a.m. sugars are the highest of the day. I again suggested metformin, which she originally refused because of possible body odor >> she agrees to try metformin extended-release. Since we are targeting the a.m. sugars, I advised her to take 1000 mg with dinner. I hope that we can decrease the Lantus at next visit and hopefully stop in the near future. - I suggested to:  Patient Instructions   Continue  - Lantus 20 units at bedtime. - Invokana 100 mg daily in am, before breakfast.  Start: - Metformin ER 1000 mg with dinner  Please stop at the lab.  Please return in 3 months with your sugar log.   - continue checking sugars at different times of the day - check 1-2 times a day, rotating checks  - advised for yearly eye exams >> she is UTD - Return to clinic in 3 mo with sugar log  Component     Latest Ref Rng 07/19/2015  Sodium     135 - 145 mmol/L 137  Potassium     3.5 - 5.1 mmol/L 3.8  Chloride     101 - 111 mmol/L 105  CO2     22 - 32 mmol/L 25  Glucose  65 - 99 mg/dL 144 (H)  BUN     6 - 20 mg/dL 15  Creatinine     0.44 - 1.00 mg/dL 0.78  Calcium     8.9 - 10.3 mg/dL 8.9  EGFR (Non-African Amer.)     >60 mL/min >60  EGFR (African American)     >60 mL/min >60  Anion gap     5 - 15 7  Normal GFR and potassium.

## 2015-07-17 NOTE — Patient Instructions (Signed)
Continue  - Lantus 20 units at bedtime. - Invokana 100 mg daily in am, before breakfast.  Start: - Metformin ER 1000 mg with dinner  Please stop at the lab.  Please return in 3 months with your sugar log.

## 2015-07-18 ENCOUNTER — Ambulatory Visit: Payer: 59 | Admitting: *Deleted

## 2015-07-19 ENCOUNTER — Other Ambulatory Visit
Admission: RE | Admit: 2015-07-19 | Discharge: 2015-07-19 | Disposition: A | Discharge status: Home / Self Care | Payer: 59 | Source: Ambulatory Visit | Attending: Internal Medicine | Admitting: Internal Medicine

## 2015-07-19 DIAGNOSIS — E119 Type 2 diabetes mellitus without complications: Secondary | ICD-10-CM | POA: Diagnosis present

## 2015-07-19 LAB — BASIC METABOLIC PANEL
ANION GAP: 7 (ref 5–15)
BUN: 15 mg/dL (ref 6–20)
CHLORIDE: 105 mmol/L (ref 101–111)
CO2: 25 mmol/L (ref 22–32)
Calcium: 8.9 mg/dL (ref 8.9–10.3)
Creatinine, Ser: 0.78 mg/dL (ref 0.44–1.00)
GFR calc Af Amer: >60 mL/min (ref >60)
GFR calc non Af Amer: >60 mL/min (ref >60)
GLUCOSE: 144 mg/dL — AB (ref 65–99)
POTASSIUM: 3.8 mmol/L (ref 3.5–5.1)
Sodium: 137 mmol/L (ref 135–145)

## 2015-07-20 ENCOUNTER — Encounter: Payer: 59 | Attending: Internal Medicine | Admitting: *Deleted

## 2015-07-20 ENCOUNTER — Encounter: Payer: Self-pay | Admitting: *Deleted

## 2015-07-20 VITALS — BP 110/60 | Ht 65.0 in | Wt 252.5 lb

## 2015-07-20 DIAGNOSIS — E119 Type 2 diabetes mellitus without complications: Secondary | ICD-10-CM | POA: Insufficient documentation

## 2015-07-20 NOTE — Progress Notes (Signed)
Diabetes Self-Management Education  Visit Type: First/Initial  Appt. Start Time: 1105 Appt. End Time: 1215  07/20/2015  [name], identified by name and date of birth, is a 54 y.o. female with a diagnosis of Diabetes: Type 2.   ASSESSMENT  Blood pressure 110/60, height 5\' 5"  (1.651 m), weight 252 lb 8 oz (114.533 kg), last menstrual period 05/01/2013. Body mass index is 42.02 kg/(m^2).      Diabetes Self-Management Education - 07/20/15 1558    Visit Information   Visit Type First/Initial   Initial Visit   Diabetes Type Type 2   Are you currently following a meal plan? Yes   What type of meal plan do you follow? No regular sodas or sweet tea; no daily chocolate; more nuts and whole wheat   Are you taking your medications as prescribed? Yes   Date Diagnosed 5 months ago   Health Coping   How would you rate your overall health? Good   Psychosocial Assessment   Patient Belief/Attitude about Diabetes Other (comment)  "Quite upset at first diagnosis but better now"   Self-care barriers None   Self-management support Family;Friends   Patient Concerns Nutrition/Meal planning;Medication;Monitoring;Glycemic Control;Weight Control   Special Needs None   Preferred Learning Style Auditory;Hands on   Learning Readiness Contemplating   How often do you need to have someone help you when you read instructions, pamphlets, or other written materials from your doctor or pharmacy? 1 - Never   What is the last grade level you completed in school? BSN   Complications   Last HgB A1C per patient/outside source 8 %  05/29/15   How often do you check your blood sugar? 1-2 times/day   Fasting Blood glucose range (mg/dL) 130-179  FBG's 141-172 mg/dL    Postprandial Blood glucose range (mg/dL) --  before dinner 118-148 mg/dL; bedtime 115-161 mg/dL   Have you had a dilated eye exam in the past 12 months? Yes   Have you had a dental exam in the past 12 months? Yes   Are you checking your  feet? Yes   How many days per week are you checking your feet? 1   Dietary Intake   Breakfast oatmeal with sugar free maple syrup   Lunch salad, chicken and potatoes   Dinner usually eats out - chicken tenders, pinto beans, green beans; hamburger, BBQ   Beverage(s) water, diet soda occasionally   Exercise   Exercise Type ADL's   Patient Education   Previous Diabetes Education No   Disease state  Definition of diabetes, type 1 and 2, and the diagnosis of diabetes   Nutrition management  Role of diet in the treatment of diabetes and the relationship between the three main macronutrients and blood glucose level;Food label reading, portion sizes and measuring food.   Physical activity and exercise  Role of exercise on diabetes management, blood pressure control and cardiac health.   Medications Reviewed insulin injection, site rotation, insulin storage and needle disposal.;Reviewed patients medication for diabetes, action, purpose, timing of dose and side effects.   Monitoring Purpose and frequency of SMBG.;Identified appropriate SMBG and/or A1C goals.   Acute complications Taught treatment of hypoglycemia - the 15 rule.   Chronic complications Relationship between chronic complications and blood glucose control   Psychosocial adjustment Identified and addressed patients feelings and concerns about diabetes   Individualized Goals (developed by patient)   Reducing Risk Improve blood sugars Decrease medications Prevent diabetes complications Lose weight Become more fit  Outcomes   Expected Outcomes Demonstrated interest in learning. Expect positive outcomes      Individualized Plan for Diabetes Self-Management Training:   Learning Objective:  Patient will have a greater understanding of diabetes self-management. Patient education plan is to attend individual and/or group sessions per assessed needs and concerns.   Plan:   Patient Instructions  Check blood sugars 2 x day before  breakfast and 2 hrs after supper or as directed by MD every day Exercise: Begin walking  for   15  minutes  3   days a week and gradually increase to 30 minutes 5 x week Eat 3 meals day,  1-2  snacks a day Space meals 4-6 hours apart Bring blood sugar records to the next class Carry fast acting glucose and a snack at all times Rotate injection sites   Expected Outcomes:  Demonstrated interest in learning. Expect positive outcomes  Education material provided:  General Meal Planning Guidelines  If problems or questions, patient to contact team via:   Johny Drilling, [place], [place], CDE [phone]  Future DSME appointment:  July 30, 2015 for Class 1

## 2015-07-20 NOTE — Patient Instructions (Addendum)
Check blood sugars 2 x day before breakfast and 2 hrs after supper or as directed by MD every day Exercise: Begin walking  for   15  minutes  3   days a week and gradually increase to 30 minutes 5 x week Eat 3 meals day,  1-2  snacks a day Space meals 4-6 hours apart Bring blood sugar records to the next class Carry fast acting glucose and a snack at all times Rotate injection sites

## 2015-07-30 ENCOUNTER — Encounter: Payer: 59 | Attending: Internal Medicine | Admitting: Dietician

## 2015-07-30 VITALS — Ht 65.0 in | Wt 251.8 lb

## 2015-07-30 DIAGNOSIS — E119 Type 2 diabetes mellitus without complications: Secondary | ICD-10-CM | POA: Insufficient documentation

## 2015-07-31 NOTE — Progress Notes (Signed)

## 2015-08-07 ENCOUNTER — Telehealth: Payer: Self-pay | Admitting: Dietician

## 2015-08-07 NOTE — Telephone Encounter (Signed)
Called patient to reschedule class which she missed yesterday evening. Left a voicemail message for her to return on 08/13/15 and reschedule class 2 at that time, or to call us if she is unable to come on 10/17.

## 2015-08-14 ENCOUNTER — Other Ambulatory Visit: Payer: Self-pay | Admitting: Internal Medicine

## 2015-09-03 ENCOUNTER — Encounter: Payer: 59 | Attending: Internal Medicine

## 2015-09-03 DIAGNOSIS — E119 Type 2 diabetes mellitus without complications: Secondary | ICD-10-CM | POA: Insufficient documentation

## 2015-09-10 ENCOUNTER — Encounter: Payer: 59 | Admitting: Dietician

## 2015-09-10 VITALS — BP 126/68 | Ht 65.0 in | Wt 249.9 lb

## 2015-09-10 DIAGNOSIS — E119 Type 2 diabetes mellitus without complications: Secondary | ICD-10-CM

## 2015-09-10 NOTE — Progress Notes (Signed)

## 2015-09-19 ENCOUNTER — Encounter: Payer: Self-pay | Admitting: Obstetrics and Gynecology

## 2015-09-19 ENCOUNTER — Ambulatory Visit (INDEPENDENT_AMBULATORY_CARE_PROVIDER_SITE_OTHER): Payer: 59 | Admitting: Obstetrics and Gynecology

## 2015-09-19 VITALS — BP 129/75 | HR 113 | Ht 65.0 in | Wt 249.6 lb

## 2015-09-19 DIAGNOSIS — B379 Candidiasis, unspecified: Secondary | ICD-10-CM

## 2015-09-19 DIAGNOSIS — M793 Panniculitis, unspecified: Secondary | ICD-10-CM

## 2015-09-19 DIAGNOSIS — N958 Other specified menopausal and perimenopausal disorders: Secondary | ICD-10-CM | POA: Diagnosis not present

## 2015-09-19 DIAGNOSIS — E8940 Asymptomatic postprocedural ovarian failure: Secondary | ICD-10-CM

## 2015-09-19 HISTORY — DX: Candidiasis, unspecified: B37.9

## 2015-09-19 MED ORDER — NYSTATIN-TRIAMCINOLONE 100000-0.1 UNIT/GM-% EX CREA: 1.0000 "application " | TOPICAL_CREAM | Freq: Two times a day (BID) | CUTANEOUS | Status: DC

## 2015-09-19 NOTE — Patient Instructions (Signed)
1.  Use Mycolog twice a day for 2 weeks for abdominal skin rash. 2.  Return in 6 months fr annual gynecologic physical. 3.  Remain off of estradiol therapy this time. 4.  Encourage calcium with vitamin D supplementation and weightbearing exercise

## 2015-09-19 NOTE — Progress Notes (Signed)
Patient ID: Cassandra Munoz, female   DOB: [birth year], 54 y.o.   MRN: [id number]   3 month f/u on hrt - 1 mg estradiol Only used for 1 month- very agitated on meds No issues since off med  1.  Chief complaint: 1.  Surgical menopause 2.  Abdominal rash  Patient presents today for flow-up on the above issues.  She is status post subtotal abdominal hysterectomy and BSO approximately 5 Months ago. She was initially started on estradiol for estrogen replacement therapy but has discontinued the medication due to side effects of irritability and agitation. Bowel and bladder Function are normal.  Past medical history, surgical history, problem list, medications, and allergies are reviewed.  Review of systems: Per HPI.  OBJECTIVE: BP 129/75 mmHg  Pulse 113  Ht 5\' 5"  (1.651 m)  Wt 249 lb 9.6 oz (113.218 kg)  BMI 41.54 kg/m2  LMP 05/01/2013  Pleasant white female in no acute distress.  She is alert and oriented. Abdomen:  Soft, nontender.  Midline incision is well approximated without evidence of hernia.  Along the lower pannus.  There is a monilia type rash with satellite lesions and erythema present. Pelvic exam: Deferred.  ASSESSMENT: 1.  Surgical menopause asymptomatic  following discontinuation of Estradiol. 2.  Monilia dermatitis of abdomen.  PLAN: 1.  Discontinue estradiol. 2.  Calcium with vitamin D and weightbearing exercise is recommended. 3.  Mycolog cream  Is prescribed for  The abdominal rash, Twice a day for 2 weeks.  Brayton Mars, MD  Note: This dictation was prepared with Dragon dictation along with smaller phrase technology. Any transcriptional errors that result from this process are unintentional.

## 2015-10-04 ENCOUNTER — Encounter: Payer: 59 | Attending: Internal Medicine | Admitting: *Deleted

## 2015-10-04 ENCOUNTER — Telehealth: Payer: Self-pay

## 2015-10-04 VITALS — Wt 249.9 lb

## 2015-10-04 DIAGNOSIS — E119 Type 2 diabetes mellitus without complications: Secondary | ICD-10-CM | POA: Diagnosis present

## 2015-10-04 NOTE — Progress Notes (Signed)

## 2015-10-08 ENCOUNTER — Other Ambulatory Visit: Payer: Self-pay | Admitting: Internal Medicine

## 2015-10-16 ENCOUNTER — Encounter: Payer: Self-pay | Admitting: Internal Medicine

## 2015-10-16 ENCOUNTER — Other Ambulatory Visit (INDEPENDENT_AMBULATORY_CARE_PROVIDER_SITE_OTHER): Payer: 59 | Admitting: *Deleted

## 2015-10-16 ENCOUNTER — Encounter: Payer: Self-pay | Admitting: *Deleted

## 2015-10-16 ENCOUNTER — Ambulatory Visit (INDEPENDENT_AMBULATORY_CARE_PROVIDER_SITE_OTHER): Payer: 59 | Admitting: Internal Medicine

## 2015-10-16 VITALS — BP 122/80 | HR 116 | Temp 97.3°F | Resp 12 | Wt 250.6 lb

## 2015-10-16 DIAGNOSIS — E1165 Type 2 diabetes mellitus with hyperglycemia: Secondary | ICD-10-CM

## 2015-10-16 DIAGNOSIS — Z794 Long term (current) use of insulin: Secondary | ICD-10-CM

## 2015-10-16 LAB — POCT GLYCOSYLATED HEMOGLOBIN (HGB A1C): HEMOGLOBIN A1C: 6.6

## 2015-10-16 MED ORDER — GLUCOSE BLOOD VI STRP: ORAL_STRIP | Status: DC

## 2015-10-16 MED ORDER — METFORMIN HCL ER 500 MG PO TB24: 2000.0000 mg | ORAL_TABLET | Freq: Every day | ORAL | Status: DC

## 2015-10-16 MED ORDER — METFORMIN HCL ER 500 MG PO TB24: 1000.0000 mg | ORAL_TABLET | Freq: Every day | ORAL | Status: DC

## 2015-10-16 NOTE — Progress Notes (Signed)
Patient ID: Cassandra Munoz, female   DOB: 1961-01-19, 54 y.o.   MRN: [id number]  HPI: Cassandra Munoz is a 54 y.o.-year-old female, initially referred by Cassandra Munoz [facility]), for management of DM2, 2012, insulin-dependent, uncontrolled, without complications. Last visit 3 mo ago.  Last hemoglobin A1c was: Lab Results  Component Value Date   HGBA1C 8.0 05/29/2015   HGBA1C 11.3* 02/20/2015  2012: HbA1c 7%  She is on: - Lantus 15 >> 20 units - added 03/2015. - Invokana 100 mg daily  - added 05/2015 - Metformin ER 1000 mg with dinner - added 06/2015  Pt checks her sugars 54x a day. - am: n/c >> 143-181 >> 150-170, 185 >> 122-178 - 2h after b'fast: n/c - before lunch: n/c >> 119-183 >> 116-156 >> 127-154 - 2h after lunch: n/c >> 119, 179 >> n/c >> 144 - before dinner: n/c >> 114-142 >> 112-139 >> 110-147 - 2h after dinner: n/c >> 130, 147-244 >> n/c >> 154 - bedtime: n/c >> 149-229 >> 143-165 >> 119-164, 194 - nighttime: n/c  ? hypoglycemia awareness. Highest sugar was 264 (nonfasting) >> 180 >> 194  Glucometer: OneTouch  No soft drinks.   - no CKD, last BUN/creatinine:  02/20/2015: 11/0.73.  - last set of lipids: Lab Results  Component Value Date   CHOL 226* 02/20/2015   LDLCALC 142 02/20/2015   TRIG 177* 02/20/2015   - last eye exam was in 01/2015.No DR. - no numbness and tingling in her feet.  She also has a h/o mild HTG: 177 on 02/20/2015 (nonfasting). LDL is high for a pt with DM: 142.  She also has a h/o vit D def (9.8 on 02/15/2015) >> started on Ergocalciferol by PCP on 02/22/2015.  ROS: Constitutional: no weight gain/loss, no fatigue, no subjective hyperthermia/hypothermia, + poor sleep Eyes: no blurry vision, no xerophthalmia ENT: no sore throat, no nodules palpated in throat, no dysphagia/odynophagia, no hoarseness Cardiovascular: no CP/SOB/palpitations/leg swelling Respiratory: no cough/SOB Gastrointestinal: no N/V/D/C Musculoskeletal: no muscle/joint  aches Skin: no rashes Neurological: no tremors/numbness/tingling/dizziness  I reviewed pt's medications, allergies, PMH, social hx, family hx, and changes were documented in the history of present illness. Otherwise, unchanged from my initial visit note.  Past Medical History  Diagnosis Date  . Psoriasis   . Enlarged uterus   . Enlarged uterus   . Abnormal glucose   . Vitamin D deficiency   . Elevated cholesterol with elevated triglycerides   . Psoriasis   . Diabetes mellitus without complication [facility])    Past Surgical History  Procedure Laterality Date  . Robotic assisted laparoscopic vaginal hysterectomy with fibroid removal      Fibroid removal  . Tonsillectomy and adenoidectomy    . Ankle arthroscopy Right   . Abdominal hysterectomy N/A 05/07/2015    Procedure: HYSTERECTOMY ABDOMINAL;  Surgeon: Brayton Mars, MD;  Location: [facility] ORS;  Service: Gynecology;  Laterality: N/A;   History   Social History Main Topics  . Smoking status: Never Smoker   . Smokeless tobacco: Not on file  . Alcohol Use: 0.0 oz/week    0 Standard drinks or equivalent per week  . Drug Use: No  . 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  Apremilast 30 MG TABS Take 30 mg by mouth 2 (two) times daily.     . BD PEN NEEDLE NANO U/F 32G X 4 MM MISC USE DAILY 100 each 2  . ergocalciferol (VITAMIN D2) 50000 UNITS capsule Take 50,000 Units by mouth 2 (two) times a week.    Marland Kitchen glucose blood (ONETOUCH VERIO) test strip Use to check blood sugar 2 times per day 100 each 12  . INVOKANA 100 MG TABS tablet TAKE 1 TABLET BY MOUTH DAILY. 30 tablet 2  . LANTUS SOLOSTAR 100 UNIT/ML Solostar Pen INJECT 15 UNITS INTO THE SKIN AT BEDTIME. 15 mL 1  . metFORMIN (GLUCOPHAGE-XR)  500 MG 24 hr tablet Take 2 tablets (1,000 mg total) by mouth daily with supper. 60 tablet 3  . nystatin-triamcinolone (MYCOLOG II) cream Apply 1 application topically 2 (two) times daily. For 10-14 days 45 g 0  . ONE TOUCH LANCETS MISC Use to check blood sugar 2 times per day 200 each 1   No current facility-administered medications on file prior to visit.   Allergies  Allergen Reactions  . Clobetasol Rash   Family History  Problem Relation Age of Onset  . Diabetes Mother   . Hypertension Mother   . Colon cancer Father   . Heart disease Father   . Hypertension Father   . Breast cancer Paternal Aunt     times 3   PE: BP 122/80 mmHg  Pulse 116  Temp(Src) 97.3 F (36.3 C) (Oral)  Resp 12  Wt 250 lb 9.6 oz (113.671 kg)  SpO2 97%  LMP 05/01/2013 Body mass index is 41.7 kg/(m^2). Wt Readings from Last 3 Encounters:  10/16/15 250 lb 9.6 oz (113.671 kg)  10/04/15 249 lb 14.4 oz (113.354 kg)  09/19/15 249 lb 9.6 oz (113.218 kg)   Constitutional: obese, in NAD Eyes: PERRLA, EOMI, no exophthalmos ENT: moist mucous membranes, no thyromegaly, no cervical lymphadenopathy Cardiovascular: tachycardia, RR, No MRG Respiratory: CTA B Gastrointestinal: abdomen soft, NT, ND, BS+ Musculoskeletal: no deformities, strength intact in all 4 Skin: moist, warm, no rashes Neurological: no tremor with outstretched hands, DTR normal in all 4  ASSESSMENT: 1. DM2, insulin-dependent, w/o complications.  PLAN:  1. Patient with an ~ 4 year h/o DM2. Her sugars markedly improved after adding Lantus and Invokana. Her a.m. sugars are still the highest of the day even after adding metformin extended-release 1000 mg with dinner. Will double the dose.  - I hope that we can decrease the Lantus at next visits and hopefully stop in the near future. - I suggested to:  Patient Instructions  Continue  - Lantus 20 units at bedtime. - Invokana 100 mg daily in am, before breakfast.  Please increase: - Metformin  ER to 2000 mg with dinner  Please return in 3 months with your sugar log.   - continue checking sugars at different times of the day - check 1-2 times a day, rotating checks  - advised for yearly eye exams >> she is UTD - refilled Metformin and strips - HbA1c 6.6% now (excellent) - Return to clinic in 3 mo with sugar log

## 2015-10-16 NOTE — Patient Instructions (Signed)
Patient Instructions  Continue  - Lantus 20 units at bedtime. - Invokana 100 mg daily in am, before breakfast.  Please increase: - Metformin ER to 2000 mg with dinner  Please return in 3 months with your sugar log.

## 2015-11-05 ENCOUNTER — Other Ambulatory Visit: Payer: Self-pay

## 2015-11-05 VITALS — Wt 245.0 lb

## 2015-11-05 DIAGNOSIS — M5430 Sciatica, unspecified side: Secondary | ICD-10-CM | POA: Insufficient documentation

## 2015-11-05 DIAGNOSIS — E119 Type 2 diabetes mellitus without complications: Secondary | ICD-10-CM

## 2015-11-05 NOTE — Patient Outreach (Signed)
[facility]) Care Management  [place]  11/05/2015   Cassandra Munoz 17-Nov-1960 [id number]  Subjective: Spoke to Opal Sidles by phone today for her initial visit in the [facility] [place] diabetes management program. She completed diabetes classes before Christmas 2016.  She is very engaged in her care; checks her blood sugars 2-3 times per day and writes them down, checks her feet daily and exercises 4-5 times per week for 15-30 minutes.  She has lost 30lbs since being diagnosed with diabetes in April 2016.  She weighs daily.  She sees Dr. Cruzita Lederer q 3 months. She reports that she takes 10-20 units of Lantus qhs, Metformin 2000mg /day and Invokana 100mg /day.    Current Medications:  Current Outpatient Prescriptions  Medication Sig Dispense Refill  . Apremilast 30 MG TABS Take 30 mg by mouth 2 (two) times daily.     . BD PEN NEEDLE NANO U/F 32G X 4 MM MISC USE DAILY 100 each 2  . ergocalciferol (VITAMIN D2) 50000 UNITS capsule Take 50,000 Units by mouth 2 (two) times a week.    Marland Kitchen glucose blood (TRUE METRIX BLOOD GLUCOSE TEST) test strip Use as instructed 2x a day 200 each 11  . INVOKANA 100 MG TABS tablet TAKE 1 TABLET BY MOUTH DAILY. 30 tablet 2  . LANTUS SOLOSTAR 100 UNIT/ML Solostar Pen INJECT 15 UNITS INTO THE SKIN AT BEDTIME. (Patient taking differently: INJECT 10-20 UNITS INTO THE SKIN AT BEDTIME.) 15 mL 1  . metFORMIN (GLUCOPHAGE-XR) 500 MG 24 hr tablet Take 4 tablets (2,000 mg total) by mouth daily with supper. 360 tablet 1  . nystatin-triamcinolone (MYCOLOG II) cream Apply 1 application topically 2 (two) times daily. For 10-14 days (Patient taking differently: Apply 1 application topically 2 (two) times daily. prn) 45 g 0  . glucose blood (ONETOUCH VERIO) test strip Use to check blood sugar 2 times per day (Patient not taking: Reported on 11/05/2015) 100 each 12  . ONE TOUCH LANCETS MISC Use to check blood sugar 2 times per day (Patient not taking: Reported on  11/05/2015) 200 each 1   No current facility-administered medications for this visit.    Functional Status:  In your present state of health, do you have any difficulty performing the following activities: 05/07/2015 05/02/2015  Hearing? N N  Vision? N N  Difficulty concentrating or making decisions? N N  Walking or climbing stairs? N N  Dressing or bathing? N N  Doing errands, shopping? N -    Fall/Depression Screening: PHQ 2/9 Scores 11/05/2015 07/20/2015  PHQ - 2 Score 0 0    Plan:  THN CM Care Plan Problem One        Most Recent Value   Care Plan Problem One  Potential for elevated blood sugars   Role Documenting the Problem One  Care Management [place] for Problem One  Active   THN Long Term Goal (31-90 days)  Patient will maintain an A1C less than 7%   THN Long Term Goal Start Date  11/05/15   Interventions for Problem One Long Term Goal  1. Continue to eat 3 meals per day sticking to water and diet drinks  2. Continue to exercise 4-5 times per week for 15-30 minutes  3. Continue to take meds as ordered     Follow up with Dr. Cruzita Lederer in March and me in April, 2017.  To call with any questions or concerns  Gentry Fitz, RN, BA, [place],  [place] Direct Dial:  [phone]  Fax:  [phone] E-mail: [email or web address] [address], [place],   [address]

## 2015-11-16 ENCOUNTER — Other Ambulatory Visit: Payer: Self-pay | Admitting: Internal Medicine

## 2015-11-22 DIAGNOSIS — Z79899 Other long term (current) drug therapy: Secondary | ICD-10-CM | POA: Diagnosis not present

## 2015-11-22 DIAGNOSIS — L821 Other seborrheic keratosis: Secondary | ICD-10-CM | POA: Diagnosis not present

## 2015-11-22 DIAGNOSIS — L400 Psoriasis vulgaris: Secondary | ICD-10-CM | POA: Diagnosis not present

## 2015-12-11 ENCOUNTER — Telehealth: Payer: Self-pay | Admitting: Internal Medicine

## 2015-12-11 MED ORDER — FLUCONAZOLE 150 MG PO TABS: 150.0000 mg | ORAL_TABLET | Freq: Once | ORAL | Status: DC

## 2015-12-11 NOTE — Telephone Encounter (Signed)
Pt called and said she needs another Rx for Diflucan sent over to [place].

## 2015-12-11 NOTE — Telephone Encounter (Signed)
Please read message below and advise.  

## 2015-12-11 NOTE — Telephone Encounter (Signed)
Ok to send

## 2015-12-11 NOTE — Telephone Encounter (Signed)
Rx sent to [place].

## 2016-01-09 ENCOUNTER — Other Ambulatory Visit: Payer: Self-pay

## 2016-01-09 NOTE — Patient Outreach (Signed)
[facility]) Care Management  01/09/2016  Cassandra Munoz 1961-02-16 [id number]   Spoke to patient today regarding diabetes care.  She tells me she has started her MSN and is not exercising as much as she had been when I last spoke to her.  She had her meter with her and her 7 day blood sugar average was 125mg /dl, 14 day average 126mg /dl and 30 day average was 133mg /dl.  She tells me her fasting blood sugars are always the highest from 135-140mg /dl, before sugars 88-120mg /dl and before bed 105-125mg /dl.  She does not generally eat a bedtime snack because she eats between 6-7pm most days.  She denies hypoglycemia.  She has a follow up visit with her physician in March, 2017.    Gentry Fitz, RN, BA, [place], [place] Direct Dial:  [phone]  Fax:  [phone] E-mail: [email or web address] [address] [place],   [address]

## 2016-01-14 ENCOUNTER — Encounter: Payer: Self-pay | Admitting: Internal Medicine

## 2016-01-14 ENCOUNTER — Ambulatory Visit (INDEPENDENT_AMBULATORY_CARE_PROVIDER_SITE_OTHER): Payer: 59 | Admitting: Internal Medicine

## 2016-01-14 VITALS — BP 136/80 | HR 112 | Temp 97.5°F | Ht 65.0 in | Wt 249.0 lb

## 2016-01-14 DIAGNOSIS — Z794 Long term (current) use of insulin: Secondary | ICD-10-CM | POA: Diagnosis not present

## 2016-01-14 DIAGNOSIS — E1165 Type 2 diabetes mellitus with hyperglycemia: Secondary | ICD-10-CM | POA: Diagnosis not present

## 2016-01-14 LAB — POCT GLYCOSYLATED HEMOGLOBIN (HGB A1C): Hemoglobin A1C: 6.4

## 2016-01-14 NOTE — Patient Instructions (Signed)
Please continue  - Lantus 20 units at bedtime. - Invokana 100 mg daily in am, before breakfast. - Metformin ER 2000 mg with dinner  Please return in 4 months with your sugar log.

## 2016-01-14 NOTE — Progress Notes (Signed)
Patient ID: Cassandra Munoz, female   DOB: 1960/11/03, 55 y.o.   MRN: [id number]  HPI: Cassandra Munoz is a 55 y.o.-year-old female, initially referred by [name] [facility]), for management of DM2, 2012, insulin-dependent, uncontrolled, without complications. Last visit 3 mo ago.  She had flu-like sxs, N/V/D x 1 week - 11/2015.  Last hemoglobin A1c was: Lab Results  Component Value Date   HGBA1C 6.6 10/16/2015   HGBA1C 8.0 05/29/2015   HGBA1C 11.3* 02/20/2015  2012: HbA1c 7%  She is on: - Lantus 15 >> 20 units - added 03/2015. - Invokana 100 mg daily  - added 05/2015 - Metformin ER 1000 >> 2000 mg with dinner   Pt checks her sugars 1-3x a day. - am: n/c >> 143-181 >> 150-170, 185 >> 122-178 >> 119-140 - 2h after b'fast: n/c - before lunch: n/c >> 119-183 >> 116-156 >> 127-154 >> 107, 114, 151 - 2h after lunch: n/c >> 119, 179 >> n/c >> 144 >> n/c - before dinner: n/c >> 114-142 >> 112-139 >> 110-147 >> 99-127, 145 - 2h after dinner: n/c >> 130, 147-244 >> n/c >> 154 >> 148 - bedtime: n/c >> 149-229 >> 143-165 >> 119-164, 194 >> 97-114 - nighttime: n/c  ? hypoglycemia awareness. Highest sugar was 264 (nonfasting) >> 180 >> 194 >> 188  Glucometer: OneTouch  No soft drinks.   - no CKD, last BUN/creatinine:  02/20/2015: 11/0.73.  - last set of lipids: Lab Results  Component Value Date   CHOL 226* 02/20/2015   LDLCALC 142 02/20/2015   TRIG 177* 02/20/2015   - last eye exam was in 01/2015.No DR. - no numbness and tingling in her feet.  She also has a h/o vit D def (9.8 on 02/15/2015) >> started on Ergocalciferol by PCP on 02/22/2015.  ROS: Constitutional: no weight gain/loss, no fatigue, no subjective hyperthermia/hypothermia Eyes: no blurry vision, no xerophthalmia ENT: no sore throat, no nodules palpated in throat, no dysphagia/odynophagia, no hoarseness, + nose bleeds Cardiovascular: no CP/SOB/palpitations/leg swelling Respiratory: no cough/SOB Gastrointestinal: no  N/V/D/C/+ occasional heartburn Musculoskeletal: no muscle/joint aches Skin: no rashes Neurological: no tremors/numbness/tingling/dizziness  I reviewed pt's medications, allergies, PMH, social hx, family hx, and changes were documented in the history of present illness. Otherwise, unchanged from my initial visit note.  Past Medical History  Diagnosis Date  . Psoriasis   . Enlarged uterus   . Enlarged uterus   . Abnormal glucose   . Vitamin D deficiency   . Elevated cholesterol with elevated triglycerides   . Psoriasis   . Diabetes mellitus without complication ([place])   . Sciatic pain 2015  . Sciatic pain 2015   Past Surgical History  Procedure Laterality Date  . Robotic assisted laparoscopic vaginal hysterectomy with fibroid removal      Fibroid removal  . Tonsillectomy and adenoidectomy    . Ankle arthroscopy Right   . Abdominal hysterectomy N/A 05/07/2015    Procedure: HYSTERECTOMY ABDOMINAL;  Surgeon: Brayton Mars, MD;  Location: [facility] ORS;  Service: Gynecology;  Laterality: N/A;   History   Social History Main Topics  . Smoking status: Never Smoker   . Smokeless tobacco: Not on file  . Alcohol Use: 0.0 oz/week    0 Standard drinks or equivalent per week  . Drug Use: No  . 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 Apremilast 30 MG TABS Take 30 mg by mouth 2 (two) times daily.     . BD PEN NEEDLE NANO U/F 32G X 4 MM MISC USE DAILY 100 each 2  . ergocalciferol (VITAMIN D2) 50000 UNITS capsule Take 50,000 Units by mouth 2 (two) times a week.    . fluconazole (DIFLUCAN) 150 MG tablet Take 1 tablet (150 mg total) by mouth once. 1 tablet 1  . glucose blood (ONETOUCH VERIO) test strip Use to check blood sugar 2 times per day 100  each 12  . glucose blood (TRUE METRIX BLOOD GLUCOSE TEST) test strip Use as instructed 2x a day 200 each 11  . INVOKANA 100 MG TABS tablet TAKE 1 TABLET BY MOUTH DAILY. 30 tablet 2  . LANTUS SOLOSTAR 100 UNIT/ML Solostar Pen INJECT 15 UNITS INTO THE SKIN AT BEDTIME. (Patient taking differently: INJECT 10-20 UNITS INTO THE SKIN AT BEDTIME.) 15 mL 1  . metFORMIN (GLUCOPHAGE-XR) 500 MG 24 hr tablet Take 4 tablets (2,000 mg total) by mouth daily with supper. 360 tablet 1  . nystatin-triamcinolone (MYCOLOG II) cream Apply 1 application topically 2 (two) times daily. For 10-14 days (Patient taking differently: Apply 1 application topically 2 (two) times daily as needed. prn) 45 g 0  . ONE TOUCH LANCETS MISC Use to check blood sugar 2 times per day 200 each 1   No current facility-administered medications on file prior to visit.   Allergies  Allergen Reactions  . Clobetasol Rash   Family History  Problem Relation Age of Onset  . Diabetes Mother   . Hypertension Mother   . Colon cancer Father   . Heart disease Father   . Hypertension Father   . Breast cancer Paternal Aunt     times 3   PE: BP 136/80 mmHg  Pulse 112  Temp(Src) 97.5 F (36.4 C) (Oral)  Ht 5\' 5"  (1.651 m)  Wt 249 lb (112.946 kg)  BMI 41.44 kg/m2  LMP 05/01/2013 Body mass index is 41.44 kg/(m^2). Wt Readings from Last 3 Encounters:  01/14/16 249 lb (112.946 kg)  11/05/15 245 lb (111.131 kg)  10/16/15 250 lb 9.6 oz (113.671 kg)   Constitutional: obese, in NAD Eyes: PERRLA, EOMI, no exophthalmos ENT: moist mucous membranes, no thyromegaly, no cervical lymphadenopathy Cardiovascular: tachycardia, RR, No MRG Respiratory: CTA B Gastrointestinal: abdomen soft, NT, ND, BS+ Musculoskeletal: no deformities, strength intact in all 4 Skin: moist, warm, no rashes Neurological: no tremor with outstretched hands, DTR normal in all 4  ASSESSMENT: 1. DM2, insulin-dependent, w/o complications.  PLAN:  1. Patient with an ~ 4  year h/o DM2. Her sugars markedly improved after adding Lantus and Invokana. Her a.m. sugars are still the highest of the day even after increasing metformin extended-release to 2000 mg with dinner. However, now they are up to ~140, which is acceptable. - I suggested to:  Patient Instructions  Please continue  - Lantus 20 units at bedtime. - Invokana 100 mg daily in am, before breakfast. - Metformin ER 2000 mg with dinner  Please return in 4 months with your sugar log.   - continue checking sugars at different times of the day - check 1-2 times a day, rotating checks  - advised for yearly eye exams >> she is UTD - refilled Metformin and strips - HbA1c today 6.4% (excellent) - Return to clinic  in 4 mo with sugar log

## 2016-01-18 ENCOUNTER — Other Ambulatory Visit: Payer: Self-pay | Admitting: Internal Medicine

## 2016-02-01 ENCOUNTER — Telehealth: Payer: Self-pay | Admitting: Obstetrics and Gynecology

## 2016-02-01 ENCOUNTER — Encounter: Payer: Self-pay | Admitting: Obstetrics and Gynecology

## 2016-02-01 DIAGNOSIS — Z1211 Encounter for screening for malignant neoplasm of colon: Secondary | ICD-10-CM

## 2016-02-01 NOTE — Telephone Encounter (Signed)
This lady said Dr. Tennis Must talked to her about getting a colonoscopy. She needs a ref to Dr. Donnella Sham or Dr. Vira Agar.

## 2016-02-01 NOTE — Telephone Encounter (Signed)
Pt aware referral in to Dr Tiffany Kocher. May take up to 10 bus days before she gets a call. Will contact office if she doesn't hear from [facility]. Sister recently dx with colon cancer.

## 2016-02-13 ENCOUNTER — Other Ambulatory Visit: Payer: Self-pay

## 2016-02-13 VITALS — BP 130/80

## 2016-02-13 DIAGNOSIS — Z794 Long term (current) use of insulin: Principal | ICD-10-CM

## 2016-02-13 DIAGNOSIS — E119 Type 2 diabetes mellitus without complications: Secondary | ICD-10-CM

## 2016-02-13 NOTE — Patient Outreach (Signed)
[facility]) Care Management  [place]  02/13/2016   Cassandra Munoz May 02, 1961 [id number]  Subjective: Patient in for follow up [facility] diabetes visit. She reports that she is currently taking full time classes on line in an MSN program and has little time to exercise- she is currently walking 15 minutes 3x/week.  She continues to check blood sugars 1-2x/day, most days of the week and reports that blood sugars in the morning remain the highest in the day.  Today's fasting blood sugar was 175m/dl. 7 day average blood sugar, 1351mdl, 14 day 13853ml, 30 day average 134m65m.  Objective:  Filed Vitals:   02/13/16 0942  BP: 130/80    Encounter Medications:  Outpatient Encounter Prescriptions as of 02/13/2016  Medication Sig  . Apremilast 30 MG TABS Take 30 mg by mouth 2 (two) times daily.   . BD PEN NEEDLE NANO U/F 32G X 4 MM MISC USE DAILY  . ergocalciferol (VITAMIN D2) 50000 UNITS capsule Take 50,000 Units by mouth 2 (two) times a week.  . glMarland Kitchencose blood (TRUE METRIX BLOOD GLUCOSE TEST) test strip Use as instructed 2x a day  . Insulin Glargine (LANTUS SOLOSTAR) 100 UNIT/ML Solostar Pen Inject 20 Units into the skin at bedtime.  . INVOKANA 100 MG TABS tablet TAKE 1 TABLET BY MOUTH DAILY  . metFORMIN (GLUCOPHAGE-XR) 500 MG 24 hr tablet TAKE 4 TABLETS (2,000 MG TOTAL) BY MOUTH DAILY WITH SUPPER.  . fluconazole (DIFLUCAN) 150 MG tablet Take 1 tablet (150 mg total) by mouth once. (Patient not taking: Reported on 02/13/2016)  . glucose blood (ONETOUCH VERIO) test strip Use to check blood sugar 2 times per day (Patient not taking: Reported on 02/13/2016)  . nystatin-triamcinolone (MYCOLOG II) cream Apply 1 application topically 2 (two) times daily. For 10-14 days (Patient taking differently: Apply 1 application topically 2 (two) times daily as needed. prn)  . ONE TOUCH LANCETS MISC Use to check blood sugar 2 times per day (Patient not taking: Reported on  02/13/2016)   No facility-administered encounter medications on file as of 02/13/2016.    Functional Status:  In your present state of health, do you have any difficulty performing the following activities: 02/13/2016 05/07/2015  Hearing? N N  Vision? N N  Difficulty concentrating or making decisions? N N  Walking or climbing stairs? N N  Dressing or bathing? N N  Doing errands, shopping? N N    Fall/Depression Screening: PHQ 2/9 Scores 02/13/2016 11/05/2015 07/20/2015  PHQ - 2 Score 2 0 0  PHQ- 9 Score 2 - -  Exception Documentation Other- indicate reason in comment box - -  Not completed patient in the middle of MSN at school- stress related to school- refused EAP - -   Assessment: Less engaged now that she is working and going to school full time. Admits to still taking in regular Coke 0-2 times per week although she has purchased smaller cans.  Recent elevated blood sugars noted around Easter when she was eating chocolate- she is trying to eat smaller portions and putting sweets with a meal to avoid peaks in blood sugars- fasting blood sugars reflect this intake.  Plan:  [facility]        Most Recent Value   Care Plan Problem One  Potential for elevated blood sugars   Role Documenting the Problem One  Care Management [place] Problem One  Not Active   [facility]  Term Goal (31-90 days)  Patient will maintain an A1C less than 7%   THN Long Term Goal Start Date  11/05/15   [facility] Long Term Goal Met Date  02/13/16   Interventions for Problem One Long Term Goal  1. Continue to eat 3 meals per day sticking to water and diet drinks  2. Continue to exercise 4-5 times per week for 15-30 minutes  3. Continue to take meds as ordered    [facility] CM Care Plan Problem Two        Most Recent Value   Care Plan Problem Two  Potential for elevated blood sugars   Role Documenting the Problem Two  Care Management Coordinator   Care Plan for Problem Two  Active    Interventions for Problem Two Long Term Goal   continue to walk 3x/week for 15 minutes with anultimate goal of 150 minutes per week.  continue to check sugars most days of the week  3. Limit sweets and always put it with a meal to avoid blood sugar excurtions   THN Long Term Goal (31-90) days  Patient will maintain an A1C less than 7% when A1C is checked by endocrinologist at her next visit   [place] Start Date  02/13/16      She needs a dilated eye exam.  She has a follow up ob/gyn appointment this spring and is scheduling a colonoscopy.  She recently had a dental visit.  Taking medications as ordered.   Follow up with me in July, 2017.   Gentry Fitz, RN, BA, [place], [place] Direct Dial:  [phone]  Fax:  [phone] E-mail: [email or web address] [address] [place], [place]  [address]

## 2016-02-28 DIAGNOSIS — Z800 Family history of malignant neoplasm of digestive organs: Secondary | ICD-10-CM | POA: Diagnosis not present

## 2016-02-28 DIAGNOSIS — Z1211 Encounter for screening for malignant neoplasm of colon: Secondary | ICD-10-CM | POA: Diagnosis not present

## 2016-03-20 ENCOUNTER — Encounter: Payer: 59 | Admitting: Obstetrics and Gynecology

## 2016-04-03 ENCOUNTER — Encounter
Admission: RE | Disposition: A | Discharge status: Home / Self Care | Payer: Self-pay | Source: Ambulatory Visit | Attending: Gastroenterology

## 2016-04-03 ENCOUNTER — Encounter: Payer: Self-pay | Admitting: *Deleted

## 2016-04-03 ENCOUNTER — Ambulatory Visit
Admission: RE | Admit: 2016-04-03 | Discharge: 2016-04-03 | Disposition: A | Discharge status: Home / Self Care | Payer: 59 | Source: Ambulatory Visit | Attending: Gastroenterology | Admitting: Gastroenterology

## 2016-04-03 ENCOUNTER — Ambulatory Visit: Payer: 59 | Admitting: Anesthesiology

## 2016-04-03 DIAGNOSIS — E559 Vitamin D deficiency, unspecified: Secondary | ICD-10-CM | POA: Diagnosis not present

## 2016-04-03 DIAGNOSIS — Z79899 Other long term (current) drug therapy: Secondary | ICD-10-CM | POA: Insufficient documentation

## 2016-04-03 DIAGNOSIS — E781 Pure hyperglyceridemia: Secondary | ICD-10-CM | POA: Insufficient documentation

## 2016-04-03 DIAGNOSIS — Z1211 Encounter for screening for malignant neoplasm of colon: Secondary | ICD-10-CM | POA: Insufficient documentation

## 2016-04-03 DIAGNOSIS — Z6841 Body Mass Index (BMI) 40.0 and over, adult: Secondary | ICD-10-CM | POA: Diagnosis not present

## 2016-04-03 DIAGNOSIS — K640 First degree hemorrhoids: Secondary | ICD-10-CM | POA: Insufficient documentation

## 2016-04-03 DIAGNOSIS — K649 Unspecified hemorrhoids: Secondary | ICD-10-CM | POA: Diagnosis not present

## 2016-04-03 DIAGNOSIS — Z7984 Long term (current) use of oral hypoglycemic drugs: Secondary | ICD-10-CM | POA: Insufficient documentation

## 2016-04-03 DIAGNOSIS — Z794 Long term (current) use of insulin: Secondary | ICD-10-CM | POA: Insufficient documentation

## 2016-04-03 DIAGNOSIS — E119 Type 2 diabetes mellitus without complications: Secondary | ICD-10-CM | POA: Diagnosis not present

## 2016-04-03 DIAGNOSIS — E7800 Pure hypercholesterolemia, unspecified: Secondary | ICD-10-CM | POA: Insufficient documentation

## 2016-04-03 DIAGNOSIS — Z800 Family history of malignant neoplasm of digestive organs: Secondary | ICD-10-CM | POA: Diagnosis not present

## 2016-04-03 HISTORY — PX: COLONOSCOPY WITH PROPOFOL: SHX5780

## 2016-04-03 LAB — GLUCOSE, CAPILLARY: GLUCOSE-CAPILLARY: 138 mg/dL — AB (ref 65–99)

## 2016-04-03 SURGERY — COLONOSCOPY WITH PROPOFOL
Anesthesia: General

## 2016-04-03 MED ORDER — MIDAZOLAM HCL 5 MG/5ML IJ SOLN
INTRAMUSCULAR | Status: DC | PRN
  Administered 2016-04-03: 1 mg via INTRAVENOUS

## 2016-04-03 MED ORDER — FENTANYL CITRATE (PF) 100 MCG/2ML IJ SOLN
INTRAMUSCULAR | Status: DC | PRN
  Administered 2016-04-03: 50 ug via INTRAVENOUS

## 2016-04-03 MED ORDER — PROPOFOL 500 MG/50ML IV EMUL
INTRAVENOUS | Status: DC | PRN
  Administered 2016-04-03: 200 ug/kg/min via INTRAVENOUS

## 2016-04-03 MED ORDER — PROPOFOL 10 MG/ML IV BOLUS
INTRAVENOUS | Status: DC | PRN
  Administered 2016-04-03: 100 mg via INTRAVENOUS

## 2016-04-03 MED ORDER — SODIUM CHLORIDE 0.9 % IV SOLN: INTRAVENOUS | Status: DC

## 2016-04-03 MED ORDER — LIDOCAINE 2% (20 MG/ML) 5 ML SYRINGE
INTRAMUSCULAR | Status: DC | PRN
  Administered 2016-04-03: 40 mg via INTRAVENOUS

## 2016-04-03 MED ORDER — SODIUM CHLORIDE 0.9 % IV SOLN
INTRAVENOUS | Status: DC
  Administered 2016-04-03 (×2): via INTRAVENOUS

## 2016-04-03 NOTE — Transfer of Care (Signed)
Immediate Anesthesia Transfer of Care Note  Patient: [name]  Procedure(s) Performed: Procedure(s): COLONOSCOPY WITH PROPOFOL (N/A)  Patient Location: PACU and Endoscopy Unit  Anesthesia Type:General  Level of Consciousness: awake, oriented and patient cooperative  Airway & Oxygen Therapy: Patient Spontanous Breathing and Patient connected to nasal cannula oxygen  Post-op Assessment: Report given to RN and Post -op Vital signs reviewed and stable  Post vital signs: Reviewed and stable  Last Vitals:  Filed Vitals:   04/03/16 0741  BP: 142/78  Pulse: 90  Temp: 36.6 C  Resp: 16    Last Pain: There were no vitals filed for this visit.       Complications: No apparent anesthesia complications

## 2016-04-03 NOTE — Anesthesia Preprocedure Evaluation (Signed)
Anesthesia Evaluation  Patient identified by MRN, date of birth, ID band Patient awake    Reviewed: Allergy & Precautions, H&P , NPO status , Patient's Chart, lab work & pertinent test results, reviewed documented beta blocker date and time   Airway Mallampati: II   Neck ROM: full    Dental  (+) Teeth Intact   Pulmonary neg pulmonary ROS,    Pulmonary exam normal        Cardiovascular negative cardio ROS Normal cardiovascular exam Rhythm:regular Rate:Normal     Neuro/Psych  Neuromuscular disease negative neurological ROS  negative psych ROS   GI/Hepatic negative GI ROS, Neg liver ROS,   Endo/Other  negative endocrine ROSdiabetesMorbid obesity  Renal/GU negative Renal ROS  negative genitourinary   Musculoskeletal   Abdominal   Peds  Hematology negative hematology ROS (+)   Anesthesia Other Findings Past Medical History:   Psoriasis                                                    Enlarged uterus                                              Enlarged uterus                                              Abnormal glucose                                             Vitamin D deficiency                                         Elevated cholesterol with elevated triglycerid*              Psoriasis                                                    Diabetes mellitus without complication (HCC)                 Sciatic pain                                    2015         Sciatic pain                                    2015       Past Surgical History:   ROBOTIC ASSISTED LAPAROSCOPIC VAGINAL HYSTEREC*                 Comment:Fibroid removal   TONSILLECTOMY AND  ADENOIDECTOMY                               ANKLE ARTHROSCOPY                               Right              ABDOMINAL HYSTERECTOMY                          N/A 05/07/2015      Comment:Procedure: HYSTERECTOMY ABDOMINAL;  Surgeon:               Brayton Mars,  MD;  Location: [facility] ORS;               Service: Gynecology;  Laterality: N/A; BMI    Body Mass Index   40.77 kg/m 2     Reproductive/Obstetrics                             Anesthesia Physical Anesthesia Plan  ASA: III  Anesthesia Plan: General   Post-op Pain Management:    Induction:   Airway Management Planned:   Additional Equipment:   Intra-op Plan:   Post-operative Plan:   Informed Consent: I have reviewed the patients History and Physical, chart, labs and discussed the procedure including the risks, benefits and alternatives for the proposed anesthesia with the patient or authorized representative who has indicated his/her understanding and acceptance.   Dental Advisory Given  Plan Discussed with: CRNA  Anesthesia Plan Comments:         Anesthesia Quick Evaluation

## 2016-04-03 NOTE — H&P (Signed)
Primary Care Physician:  Halina Maidens, MD Primary Gastroenterologist:  Dr. Vira Agar  Pre-Procedure History & Physical: HPI:  Cassandra Munoz is a 55 y.o. female is here for an colonoscopy.   Past Medical History  Diagnosis Date  . Psoriasis   . Enlarged uterus   . Enlarged uterus   . Abnormal glucose   . Vitamin D deficiency   . Elevated cholesterol with elevated triglycerides   . Psoriasis   . Diabetes mellitus without complication ([place])   . Sciatic pain 2015  . Sciatic pain 2015    Past Surgical History  Procedure Laterality Date  . Robotic assisted laparoscopic vaginal hysterectomy with fibroid removal      Fibroid removal  . Tonsillectomy and adenoidectomy    . Ankle arthroscopy Right   . Abdominal hysterectomy N/A 05/07/2015    Procedure: HYSTERECTOMY ABDOMINAL;  Surgeon: Brayton Mars, MD;  Location: [facility] ORS;  Service: Gynecology;  Laterality: N/A;    Prior to Admission medications   Medication Sig Start Date End Date Taking? Authorizing Provider  Apremilast 30 MG TABS Take 30 mg by mouth 2 (two) times daily.    Yes Historical Provider, MD  ergocalciferol (VITAMIN D2) 50000 UNITS capsule Take 50,000 Units by mouth 2 (two) times a week.   Yes Historical Provider, MD  INVOKANA 100 MG TABS tablet TAKE 1 TABLET BY MOUTH DAILY 01/18/16  Yes Philemon Kingdom, MD  metFORMIN (GLUCOPHAGE-XR) 500 MG 24 hr tablet TAKE 4 TABLETS (2,000 MG TOTAL) BY MOUTH DAILY WITH SUPPER. 01/18/16  Yes Philemon Kingdom, MD  BD PEN NEEDLE NANO U/F 32G X 4 MM MISC USE DAILY 10/08/15   Philemon Kingdom, MD  fluconazole (DIFLUCAN) 150 MG tablet Take 1 tablet (150 mg total) by mouth once. Patient not taking: Reported on 02/13/2016 12/11/15   Philemon Kingdom, MD  glucose blood (ONETOUCH VERIO) test strip Use to check blood sugar 2 times per day Patient not taking: Reported on 02/13/2016 03/30/15   Philemon Kingdom, MD  glucose blood (TRUE METRIX BLOOD GLUCOSE TEST) test strip Use as instructed 2x  a day 10/16/15   Philemon Kingdom, MD  Insulin Glargine (LANTUS SOLOSTAR) 100 UNIT/ML Solostar Pen Inject 20 Units into the skin at bedtime. 01/18/16   Philemon Kingdom, MD  nystatin-triamcinolone (MYCOLOG II) cream Apply 1 application topically 2 (two) times daily. For 10-14 days Patient taking differently: Apply 1 application topically 2 (two) times daily as needed. prn 09/19/15   Brayton Mars, MD  ONE TOUCH LANCETS MISC Use to check blood sugar 2 times per day Patient not taking: Reported on 02/13/2016 03/30/15   Philemon Kingdom, MD    Allergies as of 03/20/2016 - Review Complete 02/13/2016  Allergen Reaction Noted  . Clobetasol Rash 03/30/2015    Family History  Problem Relation Age of Onset  . Diabetes Mother   . Hypertension Mother   . Colon cancer Father   . Heart disease Father   . Hypertension Father   . Cancer Father   . Breast cancer Paternal Aunt     times 3  . Colon cancer Sister   . Cancer Sister     Social History   Social History  . Marital Status: Married    Spouse Name: N/A  . Number of Children: N/A  . Years of Education: N/A   Occupational History  . Not on file.   Social History Main Topics  . Smoking status: Never Smoker   . Smokeless tobacco: Never Used  .  Alcohol Use: 0.0 oz/week    0 Standard drinks or equivalent per week     Comment: maybe 1 drink/month - socially  . Drug Use: No  . Sexual Activity: Yes   Other Topics Concern  . 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 Neck:  Supple; no masses or thyromegaly. Lungs:  Clear throughout to auscultation.    Heart:  Regular rate and rhythm. Abdomen:  Soft, nontender and nondistended. Normal bowel sounds, without guarding, and without rebound.   Neurologic:  Alert and  oriented x4;  grossly normal neurologically.  Impression/Plan: Cassandra Munoz is here for an colonoscopy to be performed for screening, FH colon cancer in father and sister.  Risks, benefits, limitations, and alternatives regarding  colonoscopy have been reviewed with the patient.  Questions have been answered.  All parties agreeable.   Gaylyn Cheers, MD  04/03/2016, 8:19 AM

## 2016-04-03 NOTE — Op Note (Signed)
[facility] Gastroenterology Patient Name: Cassandra Munoz Procedure Date: 04/03/2016 8:16 AM MRN: [id number] Account #: [id number] Date of Birth: 10/28/1960 Admit Type: Outpatient Age: 55 Room: [facility] ENDO ROOM 4 Gender: Female Note Status: Finalized Procedure:            Colonoscopy Indications:          Screening patient at increased risk: Family history of                        colorectal cancer in multiple 1st-degree relatives Providers:            Manya Silvas, MD Referring MD:         Halina Maidens, MD (Referring MD), Alanda Slim.                        Defrancesco, MD (Referring MD) Medicines:            Propofol per Anesthesia Complications:        No immediate complications. Procedure:            Pre-Anesthesia Assessment:                       - After reviewing the risks and benefits, the patient                        was deemed in satisfactory condition to undergo the                        procedure.                       After obtaining informed consent, the colonoscope was                        passed under direct vision. Throughout the procedure,                        the patient's blood pressure, pulse, and oxygen                        saturations were monitored continuously. The                        Colonoscope was introduced through the anus and                        advanced to the the cecum, identified by appendiceal                        orifice and ileocecal valve. The colonoscopy was                        performed without difficulty. The patient tolerated the                        procedure well. The quality of the bowel preparation                        was excellent. Findings:      Internal hemorrhoids were found during endoscopy. The hemorrhoids were       small  and Grade I (internal hemorrhoids that do not prolapse).      The exam was otherwise without abnormality. Impression:           - Internal hemorrhoids.          - The examination was otherwise normal.                       - No specimens collected. Recommendation:       - Repeat colonoscopy in 5 years for screening purposes. Manya Silvas, MD 04/03/2016 8:43:07 AM This report has been signed electronically. Number of Addenda: 0 Note Initiated On: 04/03/2016 8:16 AM Scope Withdrawal Time: 0 hours 8 minutes 25 seconds  Total Procedure Duration: 0 hours 11 minutes 56 seconds       [facility]

## 2016-04-04 ENCOUNTER — Encounter: Payer: Self-pay | Admitting: Unknown Physician Specialty

## 2016-04-15 ENCOUNTER — Encounter: Payer: 59 | Admitting: Obstetrics and Gynecology

## 2016-04-15 NOTE — Anesthesia Postprocedure Evaluation (Signed)
Anesthesia Post Note  Patient: Cassandra Munoz  Procedure(s) Performed: Procedure(s) (LRB): COLONOSCOPY WITH PROPOFOL (N/A)  Patient location during evaluation: PACU Anesthesia Type: General Level of consciousness: awake and alert Pain management: pain level controlled Vital Signs Assessment: post-procedure vital signs reviewed and stable Respiratory status: spontaneous breathing, nonlabored ventilation, respiratory function stable and patient connected to nasal cannula oxygen Cardiovascular status: blood pressure returned to baseline and stable Postop Assessment: no signs of nausea or vomiting Anesthetic complications: no    Last Vitals:  Filed Vitals:   04/03/16 0905 04/03/16 0915  BP: 125/71 142/81  Pulse: 82 78  Temp:    Resp: 18 15    Last Pain:  Filed Vitals:   04/04/16 0758  PainSc: 0-No pain                 Molli Barrows

## 2016-04-18 ENCOUNTER — Other Ambulatory Visit: Payer: Self-pay | Admitting: *Deleted

## 2016-04-18 ENCOUNTER — Telehealth: Payer: Self-pay | Admitting: *Deleted

## 2016-04-18 MED ORDER — ERGOCALCIFEROL 1.25 MG (50000 UT) PO CAPS: 50000.0000 [IU] | ORAL_CAPSULE | ORAL | Status: DC

## 2016-04-18 NOTE — Telephone Encounter (Signed)
Patient called and states she needs a refill of her Vitamin D. Her pharmacy is [facility] outpatient pharmacy. Thanks

## 2016-04-18 NOTE — Telephone Encounter (Signed)
Done-ac 

## 2016-05-01 ENCOUNTER — Telehealth: Payer: Self-pay | Admitting: Pharmacist

## 2016-05-01 NOTE — Telephone Encounter (Signed)
Called patient to schedule an appointment for the [employer] [facility]. I was unable to reach the patient so I left a HIPAA-compliant message requesting that the patient return my call.   

## 2016-05-13 ENCOUNTER — Other Ambulatory Visit: Payer: Self-pay

## 2016-05-13 VITALS — BP 120/70 | Ht 65.0 in | Wt 248.0 lb

## 2016-05-13 DIAGNOSIS — Z794 Long term (current) use of insulin: Principal | ICD-10-CM

## 2016-05-13 DIAGNOSIS — E119 Type 2 diabetes mellitus without complications: Secondary | ICD-10-CM

## 2016-05-13 NOTE — Patient Outreach (Signed)
[facility] [place] [facility]) Care Management  [place]  05/13/2016   Cassandra Munoz 01/13/1961 [id number]  Subjective: Bryanna was in for her regularly scheduled [facility] visit.  She has fasting blood sugars 99-127m/dl.  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  She drinks Coke 1-2 times a week but won't have it if she has enough carbohydrates in her meals. She is trying to make healthier take-out meal choices though they likely have far too much sodium.    She needs a dilated eye exam.   Objective:  Filed Vitals:   05/13/16 0922  BP: 120/70  Height: 1.651 m (_0 )  Weight: 248 lb (112.492 kg)   Stated weight*  Encounter Medications:  Outpatient Encounter Prescriptions as of 05/13/2016  Medication Sig  . Apremilast 30 MG TABS Take 30 mg by mouth 2 (two) times daily.   . BD PEN NEEDLE NANO U/F 32G X 4 MM MISC USE DAILY  . ergocalciferol (VITAMIN D2) 50000 units capsule Take 1 capsule (50,000 Units total) by mouth 2 (two) times a week.  .Marland Kitchenglucose blood (TRUE METRIX BLOOD GLUCOSE TEST) test strip Use as instructed 2x a day  . Insulin Glargine (LANTUS SOLOSTAR) 100 UNIT/ML Solostar Pen Inject 20 Units into the skin at bedtime.  . INVOKANA 100 MG TABS tablet TAKE 1 TABLET BY MOUTH DAILY  . metFORMIN (GLUCOPHAGE-XR) 500 MG 24 hr tablet TAKE 4 TABLETS (2,000 MG TOTAL) BY MOUTH DAILY WITH SUPPER.  . ONE TOUCH LANCETS MISC Use to check blood sugar 2 times per day  . fluconazole (DIFLUCAN) 150 MG tablet Take 1 tablet (150 mg total) by mouth once. (Patient not taking: Reported on 02/13/2016)  . glucose blood (ONETOUCH VERIO) test strip Use to check blood sugar 2 times per day (Patient not taking: Reported on 02/13/2016)  . nystatin-triamcinolone (MYCOLOG II) cream Apply 1 application topically 2 (two) times daily. For 10-14 days (Patient not taking: Reported on 05/13/2016)   No facility-administered  encounter medications on file as of 05/13/2016.    Functional Status:  In your present state of health, do you have any difficulty performing the following activities: 02/13/2016  Hearing? N  Vision? N  Difficulty concentrating or making decisions? N  Walking or climbing stairs? N  Dressing or bathing? N  Doing errands, shopping? N    Fall/Depression Screening: PHQ 2/9 Scores 05/13/2016 02/13/2016 11/05/2015 07/20/2015  PHQ - 2 Score 0 2 0 0  PHQ- 9 Score - 2 - -  Exception Documentation - Other- indicate reason in comment box - -  Not completed - patient in the middle of MSN at school- stress related to school- refused EAP - -    Assessment: Very stressed related to school. She is taking her medications (Invokana, Lantus and Metformin) but not making healthy  lifestyle changes.     Plan:  [facility] Problem [facility]        Most Recent Value   Care Plan Problem One  Potential for elevated blood sugars   Role Documenting the Problem One  Care Management Coordinator   Care Plan for Problem One  Not Active   [facility] Long Term Goal (31-90 days)  Patient will maintain an A1C less than 7%   [facility] Long Term Goal Start Date  11/05/15   [facility] Met Date  02/13/16   Interventions for Problem One Long Term Goal  1. Continue to eat 3 meals per day sticking to water and diet drinks  2. Continue to exercise 4-5 times per week for 15-30 minutes  3. Continue to take meds as ordered    [facility] CM Care Plan Problem Two        Most Recent Value   Care Plan Problem Two  Potential for elevated blood sugars   Role Documenting the Problem Two  Care Management Coordinator   Care Plan for Problem Two  Not Active   Interventions for Problem Two Long Term Goal   continue to walk 3x/week for 15 minutes with anultimate goal of 150 minutes per week.  continue to check sugars most days of the week  3. Limit sweets and always put it with a meal to avoid blood sugar excurtions   [facility] Long Term Goal (31-90) days   Patient will maintain an A1C less than 7% when A1C is checked by endocrinologist at her next visit   [place] Goal Start Date  02/13/16   [facility] Long Term Goal Met Date  05/13/16    [facility] CM Care Plan Problem Three        Most Recent Value   Care Plan Problem Three  Potential for elevated A1C   Role Documenting the Problem Three  Care Management Coordinator   Care Plan for Problem Three  Active   [facility] Long Term Goal (31-90) days  Patient will maintain an A1C under 7% when it is checked by her MD    [facility] Long Term Goal Start Date  05/13/16   Interventions for Problem Three Long Term Goal  1. discussed healthier options eating out 2. discussed benefit of exercise- try to exercise 15 minutes 3x/week 3. Discussed the need for carbs and proteins at all meals with carbs 30-45grams     I will follow up with her in November, 2017   Gentry Fitz, RN, [place], [place], [place]:  [phone]  Fax:  [phone] E-mail: [email or web address] [address] [place], [place]  [address]

## 2016-05-15 ENCOUNTER — Ambulatory Visit: Payer: 59 | Attending: Internal Medicine | Admitting: Pharmacist

## 2016-05-15 ENCOUNTER — Ambulatory Visit (INDEPENDENT_AMBULATORY_CARE_PROVIDER_SITE_OTHER): Payer: 59 | Admitting: Internal Medicine

## 2016-05-15 ENCOUNTER — Encounter: Payer: Self-pay | Admitting: Internal Medicine

## 2016-05-15 VITALS — BP 118/68 | HR 107 | Ht 64.5 in | Wt 248.0 lb

## 2016-05-15 DIAGNOSIS — Z794 Long term (current) use of insulin: Secondary | ICD-10-CM

## 2016-05-15 DIAGNOSIS — L4050 Arthropathic psoriasis, unspecified: Secondary | ICD-10-CM

## 2016-05-15 DIAGNOSIS — E1165 Type 2 diabetes mellitus with hyperglycemia: Secondary | ICD-10-CM

## 2016-05-15 LAB — POCT GLYCOSYLATED HEMOGLOBIN (HGB A1C): HEMOGLOBIN A1C: 6.3

## 2016-05-15 MED ORDER — CANAGLIFLOZIN 100 MG PO TABS: 100.0000 mg | ORAL_TABLET | Freq: Every day | ORAL | Status: DC

## 2016-05-15 MED ORDER — INSULIN GLARGINE 100 UNIT/ML SOLOSTAR PEN: 22.0000 [IU] | PEN_INJECTOR | Freq: Every day | SUBCUTANEOUS | Status: DC

## 2016-05-15 NOTE — Progress Notes (Signed)
Patient ID: Cassandra Munoz, female   DOB: 08/18/1961, 55 y.o.   MRN: [id number]  HPI: Cassandra Munoz is a 55 y.o.-year-old female, initially referred by [name] Harle Battiest), for management of DM2, 2012, insulin-dependent, uncontrolled, without complications. Last visit 3 mo ago.  She is now back in school: masters.   Last hemoglobin A1c was: Lab Results  Component Value Date   HGBA1C 6.4 01/14/2016   HGBA1C 6.6 10/16/2015   HGBA1C 8.0 05/29/2015  2012: HbA1c 7%  She is on: - Lantus 15 >> 20 units - added 03/2015. - Invokana 100 mg daily  - added 05/2015 - Metformin ER 1000 >> 2000 mg with dinner   Pt checks her sugars 1-3x a day. - am: n/c >> 143-181 >> 150-170, 185 >> 122-178 >> 119-140 >> 125-158 - 2h after b'fast: n/c - before lunch: n/c >> 119-183 >> 116-156 >> 127-154 >> 107, 114, 151 >> 106-119, 150 - 2h after lunch: n/c >> 119, 179 >> n/c >> 144 >> n/c - before dinner: n/c >> 114-142 >> 112-139 >> 110-147 >> 99-127, 145 >> 98-123, 144, 155 - 2h after dinner: n/c >> 130, 147-244 >> n/c >> 154 >> 148 >> n/c - bedtime: n/c >> 149-229 >> 143-165 >> 119-164, 194 >> 97-114 >> 102-148 - nighttime: n/c  ? hypoglycemia awareness. Highest sugar was 264 (nonfasting) >> 180 >> 194 >> 188 >> 124  Glucometer: OneTouch  No soft drinks.   - no CKD, last BUN/creatinine:  02/20/2015: 11/0.73.  - last set of lipids: Lab Results  Component Value Date   CHOL 226* 02/20/2015   LDLCALC 142 02/20/2015   TRIG 177* 02/20/2015   - last eye exam was in 01/2015.No DR. - no numbness and tingling in her feet.  She also has a h/o vit D def (9.8 on 02/15/2015) >> was on Ergocalciferol.  ROS: Constitutional: no weight gain/loss, no fatigue, no subjective hyperthermia/hypothermia Eyes: no blurry vision, no xerophthalmia ENT: no sore throat, no nodules palpated in throat, no dysphagia/odynophagia, no hoarseness Cardiovascular: no CP/SOB/palpitations/leg swelling Respiratory: no  cough/SOB Gastrointestinal: no N/V/D/C/heartburn Musculoskeletal: no muscle/joint aches Skin: no rashes Neurological: no tremors/numbness/tingling/dizziness  I reviewed pt's medications, allergies, PMH, social hx, family hx, and changes were documented in the history of present illness. Otherwise, unchanged from my initial visit note.  Past Medical History  Diagnosis Date  . Psoriasis   . Enlarged uterus   . Enlarged uterus   . Abnormal glucose   . Vitamin D deficiency   . Elevated cholesterol with elevated triglycerides   . Psoriasis   . Diabetes mellitus without complication ([place])   . Sciatic pain 2015  . Sciatic pain 2015   Past Surgical History  Procedure Laterality Date  . Robotic assisted laparoscopic vaginal hysterectomy with fibroid removal      Fibroid removal  . Tonsillectomy and adenoidectomy    . Ankle arthroscopy Right   . Abdominal hysterectomy N/A 05/07/2015    Procedure: HYSTERECTOMY ABDOMINAL;  Surgeon: Brayton Mars, MD;  Location: [facility] ORS;  Service: Gynecology;  Laterality: N/A;  . Colonoscopy with propofol N/A 04/03/2016    Procedure: COLONOSCOPY WITH PROPOFOL;  Surgeon: Manya Silvas, MD;  Location: [facility] ENDOSCOPY;  Service: Endoscopy;  Laterality: N/A;   History   Social History Main Topics  . Smoking status: Never Smoker   . Smokeless tobacco: Not on file  . Alcohol Use: 0.0 oz/week    0 Standard drinks or equivalent per week  . Drug  Use: No  . 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 Apremilast 30 MG TABS Take 30 mg by mouth 2 (two) times daily.     . BD PEN NEEDLE NANO U/F 32G X 4 MM MISC USE DAILY 100 each 2  . ergocalciferol (VITAMIN D2) 50000 units capsule Take 1 capsule (50,000 Units total) by  mouth 2 (two) times a week. 12 capsule 6  . fluconazole (DIFLUCAN) 150 MG tablet Take 1 tablet (150 mg total) by mouth once. 1 tablet 1  . glucose blood (TRUE METRIX BLOOD GLUCOSE TEST) test strip Use as instructed 2x a day 200 each 11  . Insulin Glargine (LANTUS SOLOSTAR) 100 UNIT/ML Solostar Pen Inject 20 Units into the skin at bedtime. 15 mL 1  . INVOKANA 100 MG TABS tablet TAKE 1 TABLET BY MOUTH DAILY 90 tablet 1  . metFORMIN (GLUCOPHAGE-XR) 500 MG 24 hr tablet TAKE 4 TABLETS (2,000 MG TOTAL) BY MOUTH DAILY WITH SUPPER. 360 tablet 1  . ONE TOUCH LANCETS MISC Use to check blood sugar 2 times per day 200 each 1  . glucose blood (ONETOUCH VERIO) test strip Use to check blood sugar 2 times per day (Patient not taking: Reported on 02/13/2016) 100 each 12  . nystatin-triamcinolone (MYCOLOG II) cream Apply 1 application topically 2 (two) times daily. For 10-14 days (Patient not taking: Reported on 05/13/2016) 45 g 0   No current facility-administered medications on file prior to visit.   Allergies  Allergen Reactions  . Clobetasol Rash    Ointment used for psorasis   Family History  Problem Relation Age of Onset  . Diabetes Mother   . Hypertension Mother   . Colon cancer Father   . Heart disease Father   . Hypertension Father   . Cancer Father   . Breast cancer Paternal Aunt     times 3  . Colon cancer Sister   . Cancer Sister    PE: BP 118/68 mmHg  Pulse 107  Ht 5' 4.5" (1.638 m)  Wt 248 lb (112.492 kg)  BMI 41.93 kg/m2  SpO2 97%  LMP 05/01/2013 Body mass index is 41.93 kg/(m^2). Wt Readings from Last 3 Encounters:  05/15/16 248 lb (112.492 kg)  05/13/16 248 lb (112.492 kg)  04/03/16 245 lb (111.131 kg)   Constitutional: obese, in NAD Eyes: PERRLA, EOMI, no exophthalmos ENT: moist mucous membranes, no thyromegaly, no cervical lymphadenopathy Cardiovascular: tachycardia, RR, No MRG Respiratory: CTA B Gastrointestinal: abdomen soft, NT, ND, BS+ Musculoskeletal: no  deformities, strength intact in all 4 Skin: moist, warm, no rashes Neurological: no tremor with outstretched hands, DTR normal in all 4  ASSESSMENT: 1. DM2, insulin-dependent, w/o complications.  PLAN:  1. Patient with few years h/o DM2. Her sugars markedly improved after adding Lantus and Invokana. Her a.m. sugars are still the highest of the day even after increasing metformin extended-release to 2000 mg with dinner. She has spikes in the 150's >> will increase Lantus by 10%. - I suggested to:  Patient Instructions  Please continue: - Invokana 100 mg daily in am - Metformin ER 2000 mg with dinner   Increase Lantus to 22 units at night.  Please come back for a follow-up appointment in 3 months.  -  continue checking sugars at different times of the day - check 1-2 times a day, rotating checks  - advised for yearly eye exams >> she is UTD - refilled Invokana and Lantus - will check Lipids and CMP at next visit - HbA1c today 6.3% (excellent) - Return to clinic in 3 mo with sugar log

## 2016-05-15 NOTE — Patient Instructions (Signed)
Please continue: - Invokana 100 mg daily in am - Metformin ER 2000 mg with dinner   Increase Lantus to 22 units at night.  Please come back for a follow-up appointment in 3 months

## 2016-05-15 NOTE — Addendum Note (Signed)
Addended by: Caprice Beaver T on: 05/15/2016 04:20 PM   Modules accepted: Orders

## 2016-05-16 MED ORDER — APREMILAST 30 MG PO TABS: 1.0000 | ORAL_TABLET | Freq: Two times a day (BID) | ORAL | Status: DC

## 2016-05-16 MED FILL — OTEZLA 30 MG TABS: 30 | 30 days supply | Qty: 60 | Fill #0

## 2016-05-16 NOTE — Progress Notes (Signed)
   S: Patient presents today to the [facility].  Patient is currently taking Otezla for psoriatic arthritis. Patient is managed by Dr. Nehemiah Massed for this. She feels like it is doing well to control her psoriatic arthritis and stills has days of swelling or pain but overall feels a lot better.  Adherence: denies any missed doses  Monitoring: Weight loss: denies GI upset: reports monthly episodes of diarrhea that last about 1-2 days Headache: denies S/sx of infection: denies Neuropsychiatric effects: denies   O:     Lab Results  Component Value Date   WBC 6.5 05/02/2015   HGB 13.3 05/08/2015   HCT 42.3 05/02/2015   MCV 88.3 05/02/2015   PLT 256 05/02/2015      Chemistry      Component Value Date/Time   NA 137 07/19/2015 1355   K 3.8 07/19/2015 1355   CL 105 07/19/2015 1355   CO2 25 07/19/2015 1355   BUN 15 07/19/2015 1355   CREATININE 0.78 07/19/2015 1355      Component Value Date/Time   CALCIUM 8.9 07/19/2015 1355       A/P: 1. Medication review: patient on Otezla for psoriatic arthritis and is tolerating well with only occasional episodes of diarrhea. She reports that she feels her arthritis is better controlled on [place]. Reviewed the medication with her, including the following: the need for regular follow up with rheumatologist, side effects, such as headache, GI upset, and increased risk of infection. No recommendations for changes.    Nicoletta Ba, PharmD, BCPS, BCACP, [facility] [phone]

## 2016-06-23 MED FILL — OTEZLA 30 MG TABS: 30 | 30 days supply | Qty: 60 | Fill #1

## 2016-06-27 ENCOUNTER — Encounter: Payer: Self-pay | Admitting: Obstetrics and Gynecology

## 2016-06-27 ENCOUNTER — Ambulatory Visit (INDEPENDENT_AMBULATORY_CARE_PROVIDER_SITE_OTHER): Payer: 59 | Admitting: Obstetrics and Gynecology

## 2016-06-27 VITALS — BP 129/81 | HR 95 | Ht 64.5 in | Wt 249.3 lb

## 2016-06-27 DIAGNOSIS — Z01419 Encounter for gynecological examination (general) (routine) without abnormal findings: Secondary | ICD-10-CM | POA: Diagnosis not present

## 2016-06-27 NOTE — Progress Notes (Signed)
Subjective:   Cassandra Munoz is a 55 y.o. G0P0000 Caucasian female here for a routine well-woman exam.  Patient's last menstrual period was 05/01/2013.    Current complaints: none PCP: [place]         Social History: Sexual: heterosexual Marital Status: married Living situation: with spouse Occupation: Licensed conveyancer Tobacco/alcohol: no tobacco use Illicit drugs: no history of illicit drug use  The following portions of the patient's history were reviewed and updated as appropriate: allergies, current medications, past family history, past medical history, past social history, past surgical history and problem list.  Past Medical History Past Medical History:  Diagnosis Date  . Abnormal glucose   . Diabetes mellitus without complication ([place])   . Elevated cholesterol with elevated triglycerides   . Enlarged uterus   . Enlarged uterus   . Psoriasis   . Psoriasis   . Sciatic pain 2015  . Sciatic pain 2015  . Vitamin D deficiency     Past Surgical History Past Surgical History:  Procedure Laterality Date  . ABDOMINAL HYSTERECTOMY N/A 05/07/2015   Procedure: HYSTERECTOMY ABDOMINAL;  Surgeon: Brayton Mars, MD;  Location: [facility] ORS;  Service: Gynecology;  Laterality: N/A;  . ANKLE ARTHROSCOPY Right   . COLONOSCOPY WITH PROPOFOL N/A 04/03/2016   Procedure: COLONOSCOPY WITH PROPOFOL;  Surgeon: Manya Silvas, MD;  Location[facility] ENDOSCOPY;  Service: Endoscopy;  Laterality: N/A;  . ROBOTIC ASSISTED LAPAROSCOPIC VAGINAL HYSTERECTOMY WITH FIBROID REMOVAL     Fibroid removal  . TONSILLECTOMY AND ADENOIDECTOMY      Gynecologic History G0P0000  Patient's last menstrual period was 05/01/2013. Contraception: status post hysterectomy Last Pap: 04/16. Results were: normal Last mammogram: 06/16. Results were: ? On left with normal follow up  Obstetric History OB History  Gravida Para Term Preterm AB Living  0 0 0 0 0 0  SAB TAB Ectopic Multiple Live Births  0 0 0 0 0         Current Medications Current Outpatient Prescriptions on File Prior to Visit  Medication Sig Dispense Refill  . Apremilast (OTEZLA) 30 MG TABS Take 1 tablet by mouth 2 (two) times daily. 60 tablet 1  . BD PEN NEEDLE NANO U/F 32G X 4 MM MISC USE DAILY 100 each 2  . canagliflozin (INVOKANA) 100 MG TABS tablet Take 1 tablet (100 mg total) by mouth daily. 90 tablet 1  . ergocalciferol (VITAMIN D2) 50000 units capsule Take 1 capsule (50,000 Units total) by mouth 2 (two) times a week. 12 capsule 6  . glucose blood (TRUE METRIX BLOOD GLUCOSE TEST) test strip Use as instructed 2x a day 200 each 11  . Insulin Glargine (LANTUS SOLOSTAR) 100 UNIT/ML Solostar Pen Inject 22 Units into the skin at bedtime. 15 mL 5  . metFORMIN (GLUCOPHAGE-XR) 500 MG 24 hr tablet TAKE 4 TABLETS (2,000 MG TOTAL) BY MOUTH DAILY WITH SUPPER. 360 tablet 1  . Multiple Vitamin (MULTI-VITAMINS) TABS Take by mouth.    . ONE TOUCH LANCETS MISC Use to check blood sugar 2 times per day 200 each 1  . TACLONEX external suspension   4   No current facility-administered medications on file prior to visit.     Review of Systems Patient denies any headaches, blurred vision, shortness of breath, chest pain, abdominal pain, problems with bowel movements, urination, or intercourse.  Objective:  BP 129/81   Pulse 95   Ht 5' 4.5" (1.638 m)   Wt 249 lb 4.8 oz (113.1 kg)   LMP  05/01/2013   BMI 42.13 kg/m  Physical Exam  General:  Well developed, well nourished, no acute distress. She is alert and oriented x3. Skin:  Warm and dry Neck:  Midline trachea, no thyromegaly or nodules Cardiovascular: Regular rate and rhythm, no murmur heard Lungs:  Effort normal, all lung fields clear to auscultation bilaterally Breasts:  No dominant palpable mass, retraction, or nipple discharge Abdomen:  Soft, non tender, no hepatosplenomegaly or masses Pelvic:  External genitalia is normal in appearance.  The vagina is normal in appearance. The cervix  is surgically absent.  Thin prep pap is not done. Uterus is surgically absent. No adnexal masses or tenderness noted. Extremities:  No swelling or varicosities noted Psych:  She has a normal mood and affect  Assessment:   Healthy well-woman exam S/P total hysterectomy Obesity   Plan:  No labs indicated F/U 1 year for AE, or sooner if needed Mammogram ordered Colonoscopy done 2017-normal  Melody Rockney Ghee, CNM

## 2016-07-17 DIAGNOSIS — Z79899 Other long term (current) drug therapy: Secondary | ICD-10-CM | POA: Diagnosis not present

## 2016-07-17 DIAGNOSIS — L400 Psoriasis vulgaris: Secondary | ICD-10-CM | POA: Diagnosis not present

## 2016-07-22 ENCOUNTER — Other Ambulatory Visit: Payer: Self-pay | Admitting: Pharmacist

## 2016-07-22 MED ORDER — APREMILAST 30 MG PO TABS: 1.0000 | ORAL_TABLET | Freq: Two times a day (BID) | ORAL | 5 refills | Status: DC

## 2016-07-22 MED FILL — OTEZLA 30 MG TABS: 30 | 30 days supply | Qty: 60 | Fill #0

## 2016-07-24 ENCOUNTER — Other Ambulatory Visit: Payer: Self-pay | Admitting: Internal Medicine

## 2016-08-14 MED FILL — OTEZLA 30 MG TABS: 30 | 30 days supply | Qty: 60 | Fill #1

## 2016-08-15 ENCOUNTER — Encounter: Payer: Self-pay | Admitting: Internal Medicine

## 2016-08-15 ENCOUNTER — Ambulatory Visit (INDEPENDENT_AMBULATORY_CARE_PROVIDER_SITE_OTHER): Payer: 59 | Admitting: Internal Medicine

## 2016-08-15 VITALS — BP 140/82 | HR 97 | Ht 65.5 in | Wt 250.0 lb

## 2016-08-15 DIAGNOSIS — E1165 Type 2 diabetes mellitus with hyperglycemia: Secondary | ICD-10-CM | POA: Diagnosis not present

## 2016-08-15 DIAGNOSIS — R7989 Other specified abnormal findings of blood chemistry: Secondary | ICD-10-CM | POA: Diagnosis not present

## 2016-08-15 DIAGNOSIS — E559 Vitamin D deficiency, unspecified: Secondary | ICD-10-CM

## 2016-08-15 DIAGNOSIS — Z794 Long term (current) use of insulin: Secondary | ICD-10-CM

## 2016-08-15 LAB — COMPLETE METABOLIC PANEL WITH GFR
ALT: 67 U/L — ABNORMAL HIGH (ref 6–29)
AST: 38 U/L — AB (ref 10–35)
Albumin: 4.2 g/dL (ref 3.6–5.1)
Alkaline Phosphatase: 53 U/L (ref 33–130)
BUN: 15 mg/dL (ref 7–25)
CHLORIDE: 104 mmol/L (ref 98–110)
CO2: 27 mmol/L (ref 20–31)
Calcium: 9.2 mg/dL (ref 8.6–10.4)
Creat: 0.77 mg/dL (ref 0.50–1.05)
GFR, EST NON AFRICAN AMERICAN: 87 mL/min (ref >=60)
GFR, Est African American: >89 mL/min (ref >=60)
GLUCOSE: 153 mg/dL — AB (ref 65–99)
POTASSIUM: 4.4 mmol/L (ref 3.5–5.3)
SODIUM: 141 mmol/L (ref 135–146)
Total Bilirubin: 0.3 mg/dL (ref 0.2–1.2)
Total Protein: 6.9 g/dL (ref 6.1–8.1)

## 2016-08-15 LAB — MICROALBUMIN / CREATININE URINE RATIO
CREATININE, U: 65.3 mg/dL
MICROALB/CREAT RATIO: 1.1 mg/g (ref 0.0–30.0)

## 2016-08-15 LAB — LDL CHOLESTEROL, DIRECT: Direct LDL: 124 mg/dL

## 2016-08-15 LAB — LIPID PANEL
CHOL/HDL RATIO: 4
Cholesterol: 205 mg/dL — ABNORMAL HIGH (ref 0–200)
HDL: 46.6 mg/dL (ref >39.00)
NonHDL: 158.47
Triglycerides: 341 mg/dL — ABNORMAL HIGH (ref 0.0–149.0)
VLDL: 68.2 mg/dL — AB (ref 0.0–40.0)

## 2016-08-15 LAB — VITAMIN D 25 HYDROXY (VIT D DEFICIENCY, FRACTURES): VITD: 60.31 ng/mL (ref 30.00–100.00)

## 2016-08-15 LAB — POCT GLYCOSYLATED HEMOGLOBIN (HGB A1C): HEMOGLOBIN A1C: 6.4

## 2016-08-15 NOTE — Addendum Note (Signed)
Addended by: Caprice Beaver T on: 08/15/2016 04:30 PM   Modules accepted: Orders

## 2016-08-15 NOTE — Patient Instructions (Addendum)
Please continue: - Invokana 100 mg daily in am - Metformin ER 2000 mg with dinner  - Lantus 25 units at night.  Please stop at the lab.  Please come back for a follow-up appointment in 3 months.

## 2016-08-15 NOTE — Progress Notes (Addendum)
Patient ID: Cassandra Munoz, female   DOB: 04-Oct-1961, 55 y.o.   MRN: [id number]  HPI: Cassandra Munoz is a 55 y.o.-year-old female, initially referred by [name] Harle Battiest), for management of DM2, 2012, insulin-dependent, uncontrolled, without complications. Last visit 3 mo ago.  Last hemoglobin A1c was: Lab Results  Component Value Date   HGBA1C 6.3 05/15/2016   HGBA1C 6.4 01/14/2016   HGBA1C 6.6 10/16/2015  2012: HbA1c 7%  She is on: - Lantus 15 >> 20 >> 25 units - added 03/2015. - Invokana 100 mg daily  - added 05/2015 - Metformin ER 1000 >> 2000 mg with dinner   Pt checks her sugars 1-3x a day. - am: n/c >> 143-181 >> 150-170, 185 >> 122-178 >> 119-140 >> 125-158 >> 127-151 - 2h after b'fast: n/c >> 136 - before lunch: n/c >> 119-183 >> 116-156 >> 127-154 >> 107, 114, 151 >> 106-119, 150 >> 103-116 - 2h after lunch: n/c >> 119, 179 >> n/c >> 144 >> n/c - before dinner: n/c >> 114-142 >> 112-139 >> 110-147 >> 99-127, 145 >> 95, 98-123, 144, 155 >> 100-129, 141 - 2h after dinner: n/c >> 130, 147-244 >> n/c >> 154 >> 148 >> n/c - bedtime: n/c >> 149-229 >> 143-165 >> 119-164, 194 >> 97-114 >> 102-148 >> 112, 115 - nighttime: n/c  ? hypoglycemia awareness. Highest sugar was 264 (nonfasting) >> 180 >> 194 >> 188 >> 124 >> 150s  Glucometer: OneTouch  No soft drinks.   - no CKD, last BUN/creatinine:  02/20/2015: 11/0.73.  - last set of lipids: Lab Results  Component Value Date   CHOL 226 (A) 02/20/2015   LDLCALC 142 02/20/2015   TRIG 177 (A) 02/20/2015   - last eye exam was in 01/2015.No DR. - no numbness and tingling in her feet.  She also has a h/o vit D def (9.8 on 02/15/2015) >> is on Ergocalciferol 50,000 units 2x a week.  She is in a [employer]. Also works.   ROS: Constitutional: no weight gain/loss, no fatigue, no subjective hyperthermia/hypothermia Eyes: no blurry vision, no xerophthalmia ENT: no sore throat, no nodules palpated in throat, no  dysphagia/odynophagia, no hoarseness Cardiovascular: no CP/SOB/palpitations/leg swelling Respiratory: no cough/SOB Gastrointestinal: no N/V/D/C/heartburn Musculoskeletal: no muscle/joint aches Skin: no rashes Neurological: no tremors/numbness/tingling/dizziness  I reviewed pt's medications, allergies, PMH, social hx, family hx, and changes were documented in the history of present illness. Otherwise, unchanged from my initial visit note.  Past Medical History:  Diagnosis Date  . Abnormal glucose   . Diabetes mellitus without complication ([place])   . Elevated cholesterol with elevated triglycerides   . Enlarged uterus   . Enlarged uterus   . Psoriasis   . Psoriasis   . Sciatic pain 2015  . Sciatic pain 2015  . Vitamin D deficiency    Past Surgical History:  Procedure Laterality Date  . ABDOMINAL HYSTERECTOMY N/A 05/07/2015   Procedure: HYSTERECTOMY ABDOMINAL;  Surgeon: Brayton Mars, MD;  Location: [facility] ORS;  Service: Gynecology;  Laterality: N/A;  . ANKLE ARTHROSCOPY Right   . COLONOSCOPY WITH PROPOFOL N/A 04/03/2016   Procedure: COLONOSCOPY WITH PROPOFOL;  Surgeon: Manya Silvas, MD;  [facility];  Service: Endoscopy;  Laterality: N/A;  . ROBOTIC ASSISTED LAPAROSCOPIC VAGINAL HYSTERECTOMY WITH FIBROID REMOVAL     Fibroid removal  . TONSILLECTOMY AND ADENOIDECTOMY     History   Social History Main Topics  . Smoking status: Never Smoker   . Smokeless tobacco:  Not on file  . Alcohol Use: 0.0 oz/week    0 Standard drinks or equivalent per week  . Drug Use: No  . 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 Apremilast (OTEZLA) 30 MG TABS Take 1 tablet by mouth 2 (two) times daily. 60 tablet 5  . BD PEN NEEDLE NANO U/F  32G X 4 MM MISC USE DAILY 100 each 2  . canagliflozin (INVOKANA) 100 MG TABS tablet Take 1 tablet (100 mg total) by mouth daily. 90 tablet 1  . ergocalciferol (VITAMIN D2) 50000 units capsule Take 1 capsule (50,000 Units total) by mouth 2 (two) times a week. 12 capsule 6  . glucose blood (TRUE METRIX BLOOD GLUCOSE TEST) test strip Use as instructed 2x a day 200 each 11  . Insulin Glargine (LANTUS SOLOSTAR) 100 UNIT/ML Solostar Pen Inject 22 Units into the skin at bedtime. 15 mL 5  . metFORMIN (GLUCOPHAGE-XR) 500 MG 24 hr tablet TAKE 4 TABLETS (2,000 MG TOTAL) BY MOUTH DAILY WITH SUPPER. 360 tablet 1  . Multiple Vitamin (MULTI-VITAMINS) TABS Take by mouth.    . ONE TOUCH LANCETS MISC Use to check blood sugar 2 times per day 200 each 1  . TACLONEX external suspension   4   No current facility-administered medications on file prior to visit.    Allergies  Allergen Reactions  . Clobetasol Rash    Ointment used for psorasis   Family History  Problem Relation Age of Onset  . Diabetes Mother   . Hypertension Mother   . Colon cancer Father   . Heart disease Father   . Hypertension Father   . Cancer Father   . Colon cancer Sister   . Cancer Sister   . Breast cancer Paternal Aunt     times 3   PE: BP 140/82 (BP Location: Left Arm, Patient Position: Sitting)   Pulse 97   Ht 5' 5.5" (1.664 m)   Wt 250 lb (113.4 kg)   LMP 05/01/2013   SpO2 97%   BMI 40.97 kg/m  Body mass index is 40.97 kg/m. Wt Readings from Last 3 Encounters:  08/15/16 250 lb (113.4 kg)  06/27/16 249 lb 4.8 oz (113.1 kg)  05/15/16 248 lb (112.5 kg)   Constitutional: obese, in NAD Eyes: PERRLA, EOMI, no exophthalmos ENT: moist mucous membranes, no thyromegaly, no cervical lymphadenopathy Cardiovascular: tachycardia, RR, No MRG Respiratory: CTA B Gastrointestinal: abdomen soft, NT, ND, BS+ Musculoskeletal: no deformities, strength intact in all 4 Skin: moist, warm, no rashes Neurological: no tremor with  outstretched hands, DTR normal in all 4  ASSESSMENT: 1. DM2, insulin-dependent, w/o long term complications but with hyperglycemia.  PLAN:  1. Patient with few years h/o DM2. Her sugars markedly improved after adding Lantus and Invokana. Her a.m. sugars are still above target and highest of the day even after increasing metformin extended-release and moving it  to 2000 mg with dinner. No need to change regimen for now. - I suggested to:  Patient Instructions  Please continue: - Invokana 100 mg daily in am - Metformin ER 2000 mg with dinner  - Lantus 25 units at night.  Please stop at the lab.  Please come back for a follow-up appointment in 3  months.  - continue checking sugars at different times of the day - check 1-2 times a day, rotating checks  - advised for yearly eye exams >> she needs one - she is UTD with flu shot - will check ACR, Lipids and CMP - HbA1c today 6.4% (excellent) - Return to clinic in 3 mo with sugar log  Component     Latest Ref Rng & Units 08/15/2016  Sodium     135 - 146 mmol/L 141  Potassium     3.5 - 5.3 mmol/L 4.4  Chloride     98 - 110 mmol/L 104  CO2     20 - 31 mmol/L 27  Glucose     65 - 99 mg/dL 153 (H)  BUN     7 - 25 mg/dL 15  Creatinine     0.50 - 1.05 mg/dL 0.77  Total Bilirubin     0.2 - 1.2 mg/dL 0.3  Alkaline Phosphatase     33 - 130 U/L 53  AST     10 - 35 U/L 38 (H)  ALT     6 - 29 U/L 67 (H)  Total Protein     6.1 - 8.1 g/dL 6.9  Albumin     3.6 - 5.1 g/dL 4.2  Calcium     8.6 - 10.4 mg/dL 9.2  GFR, Est African American     >=60 mL/min >89  GFR, Est Non African American     >=60 mL/min 87  Cholesterol     0 - 200 mg/dL 205 (H)  Triglycerides     0.0 - 149.0 mg/dL 341.0 (H)  HDL Cholesterol     >39.00 mg/dL 46.60  VLDL     0.0 - 40.0 mg/dL 68.2 (H)  Total CHOL/HDL Ratio      4  NonHDL      158.47  Microalb, Ur     0.0 - 1.9 mg/dL <0.7  Creatinine,U     mg/dL 65.3  MICROALB/CREAT RATIO     0.0 - 30.0  mg/g 1.1  Hemoglobin A1C      6.4  VITD     30.00 - 100.00 ng/mL 60.31  Direct LDL     mg/dL 124.0   ACR, vitamin D, HbA1c all at goal. LFTs are elevated as is her LDL (although improved) and TG. Will suggest a lower fat diet and will need a statin.  Philemon Kingdom, MD PhD [facility] Endocrinology

## 2016-09-09 ENCOUNTER — Other Ambulatory Visit: Payer: Self-pay

## 2016-09-09 VITALS — BP 140/90 | Ht 65.0 in | Wt 249.7 lb

## 2016-09-09 DIAGNOSIS — E119 Type 2 diabetes mellitus without complications: Secondary | ICD-10-CM

## 2016-09-09 DIAGNOSIS — Z794 Long term (current) use of insulin: Principal | ICD-10-CM

## 2016-09-09 NOTE — Patient Outreach (Signed)
[facility]) Care Management  [place]  09/09/2016   AUDRIELLE REGER 1961-07-12 [id number]  Subjective: Deveda was in for her [facility] visit. She brought her blood sugars with her;  fasting blood sugars 117-150mg /dl- she has been checking less often than she had in the past.  She has not checked post prandial blood sugars.  She is not exercising.  She is eating fast food less often- trying to eat meals at home.  She does not skip meals.  She drinks 2- 6oz regular Cokes per week- always with a meal and only if she does not eat carbs at the meal. She denies hypoglycemia.   Objective:  Vitals:   09/09/16 0919  BP: 140/90  Weight: 249 lb 11.2 oz (113.3 kg)  Height: 1.651 m (5\' 5" )     Encounter Medications:  Outpatient Encounter Prescriptions as of 09/09/2016  Medication Sig Note  . Apremilast (OTEZLA) 30 MG TABS Take 1 tablet by mouth 2 (two) times daily.   . BD PEN NEEDLE NANO U/F 32G X 4 MM MISC USE DAILY   . canagliflozin (INVOKANA) 100 MG TABS tablet Take 1 tablet (100 mg total) by mouth daily.   . ergocalciferol (VITAMIN D2) 50000 units capsule Take 1 capsule (50,000 Units total) by mouth 2 (two) times a week.   Marland Kitchen glucose blood (TRUE METRIX BLOOD GLUCOSE TEST) test strip Use as instructed 2x a day   . Insulin Glargine (LANTUS SOLOSTAR) 100 UNIT/ML Solostar Pen Inject 22 Units into the skin at bedtime. (Patient taking differently: Inject 25 Units into the skin at bedtime. )   . metFORMIN (GLUCOPHAGE-XR) 500 MG 24 hr tablet TAKE 4 TABLETS (2,000 MG TOTAL) BY MOUTH DAILY WITH SUPPER.   . Multiple Vitamin (MULTI-VITAMINS) TABS Take by mouth. 05/15/2016: Received from: [place]  . ONE TOUCH LANCETS MISC Use to check blood sugar 2 times per day   . TACLONEX external suspension  05/15/2016: Received from: External Pharmacy   No facility-administered encounter medications on file as of 09/09/2016.     Functional Status:  In your  present state of health, do you have any difficulty performing the following activities: 02/13/2016  Hearing? N  Vision? N  Difficulty concentrating or making decisions? N  Walking or climbing stairs? N  Dressing or bathing? N  Doing errands, shopping? N  Some recent data might be hidden    Fall/Depression Screening: PHQ 2/9 Scores 09/09/2016 05/13/2016 02/13/2016 11/05/2015 07/20/2015  PHQ - 2 Score 0 0 2 0 0  PHQ- 9 Score - - 2 - -  Exception Documentation - - Other- indicate reason in comment box - -  Not completed - - patient in the middle of MSN at school- stress related to school- refused EAP - -    Assessment: Patient is engaged in her care, trying to do the best she can.  She is under tremendous stress at her job. We discussed diet, checking blood sugars, and the potential for complications.  Her blood pressure is elevated today.    Plan:  [facility] CM Care Plan Problem Three   Flowsheet Row Most Recent Value  Care Plan Problem Three  Potential for elevated A1C  Role Documenting the Problem Three  Care Management Coordinator  Care Plan for Problem Three  Active  [facility] Long Term Goal (31-90) days  Patient will maintain an A1C under 7% when it is checked by her MD   [place] Term Goal Start Date  09/09/16  Interventions for Problem Three Long Term Goal  1. discussed the benefit exercise and encouraged exercise 2. reviewed medications  3. discussed stress and how it impacts blood sugars and BP      Schedule dilated eye exam this year. Schedule mammogram. Schedule Dr. Carolin Coy this year. Patient will check sugars at least 5x/week.  Please evaluate the need for BP medications and cholesterol medications as part of her preventative care.  BP is elevated today.    Gentry Fitz, RN, BA, [place], [place] Direct Dial:  [phone]  Fax:  [phone] E-mail: [email or web address] [address] [place],  [place]  [address]

## 2016-09-10 ENCOUNTER — Ambulatory Visit: Payer: Self-pay

## 2016-09-16 MED FILL — OTEZLA 30 MG TABS: 30 | 30 days supply | Qty: 60 | Fill #2

## 2016-10-13 MED FILL — OTEZLA 30 MG TABS: 30 | 30 days supply | Qty: 60 | Fill #3

## 2016-10-16 DIAGNOSIS — L718 Other rosacea: Secondary | ICD-10-CM | POA: Diagnosis not present

## 2016-10-16 DIAGNOSIS — L400 Psoriasis vulgaris: Secondary | ICD-10-CM | POA: Diagnosis not present

## 2016-10-17 ENCOUNTER — Other Ambulatory Visit: Payer: Self-pay | Admitting: Internal Medicine

## 2016-11-17 ENCOUNTER — Encounter: Payer: Self-pay | Admitting: Internal Medicine

## 2016-11-17 ENCOUNTER — Ambulatory Visit (INDEPENDENT_AMBULATORY_CARE_PROVIDER_SITE_OTHER): Payer: 59 | Admitting: Internal Medicine

## 2016-11-17 VITALS — BP 130/78 | HR 98 | Wt 252.0 lb

## 2016-11-17 DIAGNOSIS — Z794 Long term (current) use of insulin: Secondary | ICD-10-CM

## 2016-11-17 DIAGNOSIS — E785 Hyperlipidemia, unspecified: Secondary | ICD-10-CM

## 2016-11-17 DIAGNOSIS — E1165 Type 2 diabetes mellitus with hyperglycemia: Secondary | ICD-10-CM | POA: Diagnosis not present

## 2016-11-17 LAB — POCT GLYCOSYLATED HEMOGLOBIN (HGB A1C): Hemoglobin A1C: 6.3

## 2016-11-17 NOTE — Addendum Note (Signed)
Addended by: Caprice Beaver T on: 11/17/2016 04:14 PM   Modules accepted: Orders

## 2016-11-17 NOTE — Patient Instructions (Addendum)
Please continue: - Invokana 100 mg daily in am - Metformin ER 2000 mg with dinner  - Lantus 25 units at night.  Please come back in 1 month fasting for lipids.  Please come back for a follow-up appointment in 4 months.

## 2016-11-17 NOTE — Progress Notes (Addendum)
Patient ID: Cassandra Munoz, female   DOB: 03-05-1961, 56 y.o.   MRN: [id number]  HPI: Cassandra Munoz is a 56 y.o.-year-old female, initially referred by Cassandra Munoz), for management of DM2, 2012, insulin-dependent, uncontrolled, without complications. Last visit 3 mo ago.  Last hemoglobin A1c was: Lab Results  Component Value Date   HGBA1C 6.4 08/15/2016   HGBA1C 6.3 05/15/2016   HGBA1C 6.4 01/14/2016  2012: HbA1c 7%  She is on: - Lantus 15 >> 20 >> 25 units - added 03/2015. - Invokana 100 mg daily  - added 05/2015 - Metformin ER 1000 >> 2000 mg with dinner   Pt checks her sugars 1-3x a day. - am: n/c >> 143-181 >> 150-170, 185 >> 122-178 >> 119-140 >> 125-158 >> 127-151 >> 126-146, 170 - 2h after b'fast: n/c >> 136 - before lunch: n/c >> 119-183 >> 116-156 >> 127-154 >> 107, 114, 151 >> 106-119, 150 >> 103-116 >> 112-130 - 2h after lunch: n/c >> 119, 179 >> n/c >> 144 >> n/c - before dinner:112-139 >> 110-147 >> 99-127, 145 >> 95, 98-123, 144, 155 >> 100-129, 141 >> 99, 103-132 - 2h after dinner: n/c >> 130, 147-244 >> n/c >> 154 >> 148 >> n/c >> 160-181 - bedtime: n/c >> 149-229 >> 143-165 >> 119-164, 194 >> 97-114 >> 102-148 >> 112, 115 >> 99-148 - nighttime: n/c  ? hypoglycemia awareness. Highest sugar was 264 (nonfasting) >> 180 >> 194 >> 188 >> 124 >> 150s >> 170.  Glucometer: OneTouch  - no CKD, last BUN/creatinine:  Lab Results  Component Value Date   BUN 15 08/15/2016   Lab Results  Component Value Date   CREATININE 0.77 08/15/2016   02/20/2015: 11/0.73. - last set of lipids: Lab Results  Component Value Date   CHOL 205 (H) 08/15/2016   HDL 46.60 08/15/2016   LDLCALC 142 02/20/2015   LDLDIRECT 124.0 08/15/2016   TRIG 341.0 (H) 08/15/2016   CHOLHDL 4 08/15/2016   - last eye exam was in 01/2015.No DR. - no numbness and tingling in her feet.  She also has a h/o vit D def (9.8 on 02/15/2015) >> on Ergocalciferol 50,000 units 2x a  week.  ROS: Constitutional: no weight gain/loss, no fatigue, no subjective hyperthermia/hypothermia Eyes: no blurry vision, no xerophthalmia ENT: no sore throat, no nodules palpated in throat, no dysphagia/odynophagia, no hoarseness Cardiovascular: no CP/SOB/palpitations/leg swelling Respiratory: no cough/SOB Gastrointestinal: no N/V/D/C/heartburn Musculoskeletal: no muscle/joint aches Skin: no rashes Neurological: no tremors/numbness/tingling/dizziness  I reviewed pt's medications, allergies, PMH, social hx, family hx, and changes were documented in the history of present illness. Otherwise, unchanged from my initial visit note.  Past Medical History:  Diagnosis Date  . Abnormal glucose   . Diabetes mellitus without complication ([place])   . Elevated cholesterol with elevated triglycerides   . Enlarged uterus   . Enlarged uterus   . Psoriasis   . Psoriasis   . Sciatic pain 2015  . Sciatic pain 2015  . Vitamin D deficiency    Past Surgical History:  Procedure Laterality Date  . ABDOMINAL HYSTERECTOMY N/A 05/07/2015   Procedure: HYSTERECTOMY ABDOMINAL;  Surgeon: Brayton Mars, MD;  Location: [facility] ORS;  Service: Gynecology;  Laterality: N/A;  . ANKLE ARTHROSCOPY Right   . COLONOSCOPY WITH PROPOFOL N/A 04/03/2016   Procedure: COLONOSCOPY WITH PROPOFOL;  Surgeon: Manya Silvas, MD;  Location[facility] ENDOSCOPY;  Service: Endoscopy;  Laterality: N/A;  . ROBOTIC ASSISTED LAPAROSCOPIC VAGINAL HYSTERECTOMY WITH FIBROID REMOVAL  Fibroid removal  . TONSILLECTOMY AND ADENOIDECTOMY     History   Social History Main Topics  . Smoking status: Never Smoker   . Smokeless tobacco: Not on file  . Alcohol Use: 0.0 oz/week    0 Standard drinks or equivalent per week  . Drug Use: No  . 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 Apremilast (OTEZLA) 30 MG TABS Take 1 tablet by mouth 2 (two) times daily. 60 tablet 5  . BD PEN NEEDLE NANO U/F 32G X 4 MM MISC USE DAILY 100 each 2  . canagliflozin (INVOKANA) 100 MG TABS tablet Take 1 tablet (100 mg total) by mouth daily. 90 tablet 1  . ergocalciferol (VITAMIN D2) 50000 units capsule Take 1 capsule (50,000 Units total) by mouth 2 (two) times a week. 12 capsule 6  . glucose blood (TRUE METRIX BLOOD GLUCOSE TEST) test strip Use as instructed 2x a day 200 each 11  . Insulin Glargine (LANTUS SOLOSTAR) 100 UNIT/ML Solostar Pen Inject 22 Units into the skin at bedtime. (Patient taking differently: Inject 25 Units into the skin at bedtime. ) 15 mL 5  . metFORMIN (GLUCOPHAGE-XR) 500 MG 24 hr tablet TAKE 4 TABLETS BY MOUTH DAILY WITH SUPPER 360 tablet 1  . Multiple Vitamin (MULTI-VITAMINS) TABS Take by mouth.    . ONE TOUCH LANCETS MISC Use to check blood sugar 2 times per day 200 each 1  . TACLONEX external suspension   4   No current facility-administered medications on file prior to visit.    Allergies  Allergen Reactions  . Clobetasol Rash    Ointment used for psorasis   Family History  Problem Relation Age of Onset  . Diabetes Mother   . Hypertension Mother   . Colon cancer Father   . Heart disease Father   . Hypertension Father   . Cancer Father   . Colon cancer Sister   . Cancer Sister   . Breast cancer Paternal Aunt     times 3   PE: BP 130/78 (BP Location: Left Arm, Patient Position: Sitting)   Pulse 98   Wt 252 lb (114.3 kg)   LMP 05/01/2013   SpO2 98%   BMI 41.93 kg/m  Body mass index is 41.93 kg/m. Wt Readings from Last 3 Encounters:  11/17/16 252 lb (114.3 kg)  09/09/16 249 lb 11.2 oz (113.3 kg)  08/15/16 250 lb (113.4 kg)   Constitutional: obese, in NAD Eyes: PERRLA, EOMI, no exophthalmos ENT: moist mucous membranes, no thyromegaly, no cervical  lymphadenopathy Cardiovascular: tachycardia, RR, No MRG Respiratory: CTA B Gastrointestinal: abdomen soft, NT, ND, BS+ Musculoskeletal: no deformities, strength intact in all 4 Skin: moist, warm, no rashes Neurological: no tremor with outstretched hands, DTR normal in all 4  ASSESSMENT: 1. DM2, insulin-dependent, w/o long term complications but with hyperglycemia.  2. HL  PLAN:  1. Patient with few years h/o DM2. Her sugars improved after adding Lantus and Invokana, but her a.m. sugars are still higher and highest of the day even after increasing metformin extended-release and moving it  to 2000 mg with dinner. We discussed that this is likely 2/2 increased liver GNG overnight and also possibly from [facility] phenomenon. We discussed about exercise  as a means to improve these. - No need to change regimen for now.  - I suggested to:  Patient Instructions  Please continue: - Invokana 100 mg daily in am - Metformin ER 2000 mg with dinner  - Lantus 25 units at night.  Please come back in 1 month fasting for lipids.  Please come back for a follow-up appointment in 4 months.  - continue checking sugars at different times of the day - check 1-2 times a day, rotating checks  - advised for yearly eye exams >> she needs one >> had to miss it 2/2 weather >> will reschedule - she is UTD with flu shot - HbA1c today 6.3% (excellent) - Return to clinic in 4 mo with sugar log  2. HL - we discussed addition of a statin. She would like to repeat the Lipid panel as at the time of the last one she was eating fast food and eating out more while she was in school ([employer]). Will repeat this In 1 mo.  Lab Results  Component Value Date   CHOL 182 12/18/2016   HDL 45.70 12/18/2016   LDLCALC 109 (H) 12/18/2016   LDLDIRECT 124.0 08/15/2016   TRIG 139.0 12/18/2016   CHOLHDL 4 12/18/2016   Lipids are better! We can hold off the addition of a statin for now.   Philemon Kingdom, MD PhD [facility]  Endocrinology

## 2016-11-20 ENCOUNTER — Other Ambulatory Visit: Payer: Self-pay | Admitting: Internal Medicine

## 2016-11-21 MED FILL — OTEZLA 30 MG TABS: 30 | 30 days supply | Qty: 60 | Fill #4

## 2016-12-17 DIAGNOSIS — H524 Presbyopia: Secondary | ICD-10-CM | POA: Diagnosis not present

## 2016-12-17 LAB — HM DIABETES EYE EXAM

## 2016-12-18 ENCOUNTER — Other Ambulatory Visit (INDEPENDENT_AMBULATORY_CARE_PROVIDER_SITE_OTHER): Payer: 59

## 2016-12-18 ENCOUNTER — Other Ambulatory Visit: Payer: Self-pay

## 2016-12-18 ENCOUNTER — Telehealth: Payer: Self-pay | Admitting: Internal Medicine

## 2016-12-18 DIAGNOSIS — E785 Hyperlipidemia, unspecified: Secondary | ICD-10-CM

## 2016-12-18 LAB — LIPID PANEL
CHOLESTEROL: 182 mg/dL (ref 0–200)
HDL: 45.7 mg/dL (ref >39.00)
LDL CALC: 109 mg/dL — AB (ref 0–99)
NonHDL: 136.47
TRIGLYCERIDES: 139 mg/dL (ref 0.0–149.0)
Total CHOL/HDL Ratio: 4
VLDL: 27.8 mg/dL (ref 0.0–40.0)

## 2016-12-18 MED ORDER — GLUCOSE BLOOD VI STRP: ORAL_STRIP | 5 refills | Status: DC

## 2016-12-18 NOTE — Telephone Encounter (Signed)
Submitted

## 2016-12-18 NOTE — Telephone Encounter (Signed)
Refill of  Accu Chek Guide    Send to  [place], [place] [phone] (Phone) [phone] (Fax)

## 2016-12-22 MED FILL — OTEZLA 30 MG TABS: 30 | 30 days supply | Qty: 60 | Fill #5

## 2017-01-26 ENCOUNTER — Other Ambulatory Visit: Payer: Self-pay | Admitting: Pharmacist

## 2017-01-26 MED ORDER — APREMILAST 30 MG PO TABS: 1.0000 | ORAL_TABLET | Freq: Two times a day (BID) | ORAL | 5 refills | Status: DC

## 2017-02-02 ENCOUNTER — Other Ambulatory Visit: Payer: Self-pay | Admitting: Obstetrics and Gynecology

## 2017-03-03 ENCOUNTER — Other Ambulatory Visit: Payer: Self-pay | Admitting: Pharmacist

## 2017-03-03 MED ORDER — APREMILAST 30 MG PO TABS: 1.0000 | ORAL_TABLET | Freq: Two times a day (BID) | ORAL | 5 refills | Status: DC

## 2017-03-03 MED FILL — OTEZLA 30 MG TABS: 30 | 30 days supply | Qty: 60 | Fill #0

## 2017-03-17 ENCOUNTER — Telehealth: Payer: Self-pay | Admitting: Internal Medicine

## 2017-03-17 ENCOUNTER — Ambulatory Visit (INDEPENDENT_AMBULATORY_CARE_PROVIDER_SITE_OTHER): Payer: 59 | Admitting: Internal Medicine

## 2017-03-17 ENCOUNTER — Other Ambulatory Visit: Payer: Self-pay

## 2017-03-17 ENCOUNTER — Encounter: Payer: Self-pay | Admitting: Internal Medicine

## 2017-03-17 VITALS — BP 130/74 | HR 103 | Wt 254.0 lb

## 2017-03-17 DIAGNOSIS — Z794 Long term (current) use of insulin: Secondary | ICD-10-CM

## 2017-03-17 DIAGNOSIS — E1165 Type 2 diabetes mellitus with hyperglycemia: Secondary | ICD-10-CM

## 2017-03-17 LAB — POCT GLYCOSYLATED HEMOGLOBIN (HGB A1C): HEMOGLOBIN A1C: 6.9

## 2017-03-17 MED ORDER — ACCU-CHEK FASTCLIX LANCETS MISC: 5 refills | Status: DC

## 2017-03-17 MED ORDER — METFORMIN HCL ER 500 MG PO TB24
ORAL_TABLET | ORAL | 3 refills | Status: DC
Start: 2017-03-17 — End: 2017-11-23

## 2017-03-17 MED ORDER — FREESTYLE LIBRE SENSOR SYSTEM MISC: 1.0000 | 11 refills | Status: DC

## 2017-03-17 MED ORDER — INSULIN GLARGINE 100 UNIT/ML SOLOSTAR PEN: 25.0000 [IU] | PEN_INJECTOR | Freq: Every day | SUBCUTANEOUS | 3 refills | Status: DC

## 2017-03-17 MED ORDER — CANAGLIFLOZIN 100 MG PO TABS: 100.0000 mg | ORAL_TABLET | Freq: Every day | ORAL | 3 refills | Status: DC

## 2017-03-17 MED ORDER — FREESTYLE LIBRE READER DEVI: 1.0000 | Freq: Three times a day (TID) | 1 refills | Status: DC

## 2017-03-17 NOTE — Progress Notes (Signed)
Patient ID: Cassandra Munoz, female   DOB: 1960-11-21, 56 y.o.   MRN: [id number]  HPI: Cassandra Munoz is a 56 y.o.-year-old female, initially referred by Cassandra Munoz [facility]), for management of DM2, 2012, insulin-dependent, uncontrolled, without long term complications. Last visit 4 mo ago.  Since last visit, she drank more milkshakes (from [name]).   Last hemoglobin A1c was: Lab Results  Component Value Date   HGBA1C 6.3 11/17/2016   HGBA1C 6.4 08/15/2016   HGBA1C 6.3 05/15/2016  2012: HbA1c 7%  She is on: - Lantus 15 >> 20 >> 25 units - added 03/2015. - Invokana 100 mg daily  - added 05/2015 - Metformin ER 1000 >> 2000 mg with dinner   Pt checks her sugars 1-3x a day. - am:119-140 >> 125-158 >> 127-151 >> 126-146, 170 >> 127-156 - 2h after b'fast: n/c >> 136 >> 114 - before lunch: 106-119, 150 >> 103-116 >> 112-130 >> 111 - 2h after lunch: n/c >> 119, 179 >> n/c >> 144 >> n/c - before dinner: 100-129, 141 >> 99, 103-132 >> 104-130 - 2h after dinner: 154 >> 148 >> n/c >> 160-181 >> n/c - bedtime: 102-148 >> 112, 115 >> 99-148 >> n/c - nighttime: n/c  ? hypoglycemia awareness. Highest sugar was 170 >> 102.  Glucometer: OneTouch  - no CKD, last BUN/creatinine:  Lab Results  Component Value Date   BUN 15 08/15/2016   Lab Results  Component Value Date   CREATININE 0.77 08/15/2016   - last set of lipids: Lab Results  Component Value Date   CHOL 182 12/18/2016   HDL 45.70 12/18/2016   LDLCALC 109 (H) 12/18/2016   LDLDIRECT 124.0 08/15/2016   TRIG 139.0 12/18/2016   CHOLHDL 4 12/18/2016   - last eye exam was in 11/2016 - No DR. - no numbness and tingling in her feet.  She also has a h/o vit D def (9.8 on 02/15/2015) >> on Ergocalciferol 50,000 units 2x a week.  ROS: Constitutional: + weight gain, no fatigue, no subjective hyperthermia, no subjective hypothermia Eyes: no blurry vision, no xerophthalmia ENT: no sore throat, no nodules palpated in throat, no  dysphagia, no odynophagia, no hoarseness Cardiovascular: no CP/no SOB/no palpitations/no leg swelling Respiratory: no cough/no SOB/no wheezing Gastrointestinal: no N/no V/no D/no C/no acid reflux Musculoskeletal: no muscle aches/no joint aches Skin: no rashes, no hair loss Neurological: no tremors/no numbness/no tingling/no dizziness  I reviewed pt's medications, allergies, PMH, social hx, family hx, and changes were documented in the history of present illness. Otherwise, unchanged from my initial visit note.  Past Medical History:  Diagnosis Date  . Abnormal glucose   . Diabetes mellitus without complication ([place])   . Elevated cholesterol with elevated triglycerides   . Enlarged uterus   . Enlarged uterus   . Psoriasis   . Psoriasis   . Sciatic pain 2015  . Sciatic pain 2015  . Vitamin D deficiency    Past Surgical History:  Procedure Laterality Date  . ABDOMINAL HYSTERECTOMY N/A 05/07/2015   Procedure: HYSTERECTOMY ABDOMINAL;  Surgeon: Brayton Mars, MD;  Location: [facility] ORS;  Service: Gynecology;  Laterality: N/A;  . ANKLE ARTHROSCOPY Right   . COLONOSCOPY WITH PROPOFOL N/A 04/03/2016   Procedure: COLONOSCOPY WITH PROPOFOL;  Surgeon: Manya Silvas, MD;  Location: [facility] ENDOSCOPY;  Service: Endoscopy;  Laterality: N/A;  . ROBOTIC ASSISTED LAPAROSCOPIC VAGINAL HYSTERECTOMY WITH FIBROID REMOVAL     Fibroid removal  . TONSILLECTOMY AND ADENOIDECTOMY  History   Social History Main Topics  . Smoking status: Never Smoker   . Smokeless tobacco: Not on file  . Alcohol Use: 0.0 oz/week    0 Standard drinks or equivalent per week  . Drug Use: No  . 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 Apremilast (OTEZLA) 30 MG TABS Take 1  tablet by mouth 2 (two) times daily. 60 tablet 5  . BD PEN NEEDLE NANO U/F 32G X 4 MM MISC USE DAILY 100 each 2  . glucose blood (ACCU-CHEK GUIDE) test strip Use as instructed to check sugar two times daily 200 each 5  . Insulin Glargine (LANTUS SOLOSTAR) 100 UNIT/ML Solostar Pen Inject 22 Units into the skin at bedtime. (Patient taking differently: Inject 25 Units into the skin at bedtime. ) 15 mL 5  . INVOKANA 100 MG TABS tablet TAKE 1 TABLET BY MOUTH DAILY. 90 tablet 1  . metFORMIN (GLUCOPHAGE-XR) 500 MG 24 hr tablet TAKE 4 TABLETS BY MOUTH DAILY WITH SUPPER 360 tablet 1  . Multiple Vitamin (MULTI-VITAMINS) TABS Take by mouth.    . ONE TOUCH LANCETS MISC Use to check blood sugar 2 times per day 200 each 1  . TACLONEX external suspension   4  . Vitamin D, Ergocalciferol, (DRISDOL) 50000 units CAPS capsule TAKE 1 CAPSULE BY MOUTH 2 TIMES A WEEK. 12 capsule 6   No current facility-administered medications on file prior to visit.    Allergies  Allergen Reactions  . Clobetasol Rash    Ointment used for psorasis   Family History  Problem Relation Age of Onset  . Diabetes Mother   . Hypertension Mother   . Colon cancer Father   . Heart disease Father   . Hypertension Father   . Cancer Father   . Colon cancer Sister   . Cancer Sister   . Breast cancer Paternal Aunt        times 3   PE: BP 130/74 (BP Location: Left Arm, Patient Position: Sitting)   Pulse (!) 103   Wt 254 lb (115.2 kg)   LMP 05/01/2013   SpO2 96%   BMI 42.27 kg/m  Body mass index is 42.27 kg/m. Wt Readings from Last 3 Encounters:  03/17/17 254 lb (115.2 kg)  11/17/16 252 lb (114.3 kg)  09/09/16 249 lb 11.2 oz (113.3 kg)   Constitutional: obese, in NAD Eyes: PERRLA, EOMI, no exophthalmos ENT: moist mucous membranes, no thyromegaly, no cervical lymphadenopathy Cardiovascular: RRR, No MRG Respiratory: CTA B Gastrointestinal: abdomen soft, NT, ND, BS+ Musculoskeletal: no deformities, strength intact in all  4 Skin: moist, warm, no rashes Neurological: no tremor with outstretched hands, DTR normal in all 4  ASSESSMENT: 1. DM2, insulin-dependent, w/o long term complications but with hyperglycemia.  2. HL  PLAN:  1. Patient with h/o Uncontrolled DM 2, now with slightly worse sugars in the morning, after she started to drink more milkshakes. She is aware that these are increasing her sugars, her cholesterol levels, and her weight (she already gained several pounds) and she would like to stop. I strongly encouraged her to do that, but no other medication changes needed at this point. - We discussed about starting FreeStyle Libre CGM >> I explained how  this works and send it to her pharmacy. - I suggested to:  Patient Instructions  Please stop milkshakes!  Please continue: - Invokana 100 mg daily in am - Metformin ER 2000 mg with dinner  - Lantus 25 units at night.  Please come back for a follow-up appointment in 3 months.  - today, HbA1c is 6.9% (higher) - continue checking sugars at different times of the day - check 1x a day, rotating checks - advised for yearly eye exams >> she is UTD - Return to clinic in 3 mo with sugar log   2. HL - at last visit, we discussed about adding a statin >> we held off as her Lipids were improving. I am afraid her Lipids will worsen b/c her milkshakes >> will need to stop these. She agrees.  Lab Results  Component Value Date   CHOL 182 12/18/2016   HDL 45.70 12/18/2016   LDLCALC 109 (H) 12/18/2016   LDLDIRECT 124.0 08/15/2016   TRIG 139.0 12/18/2016   CHOLHDL 4 12/18/2016   Lipids are better! We can hold off the addition of a statin for now.   Philemon Kingdom, MD PhD [facility] Endocrinology

## 2017-03-17 NOTE — Telephone Encounter (Signed)
Refill of lancets for the glucose blood (ACCU-CHEK GUIDE)   [place], [place] [phone] (Phone) [phone] (Fax)

## 2017-03-17 NOTE — Patient Instructions (Addendum)
Please stop milkshakes!  Please continue: - Invokana 100 mg daily in am - Metformin ER 2000 mg with dinner  - Lantus 25 units at night.  Please come back for a follow-up appointment in 3 months.

## 2017-03-17 NOTE — Telephone Encounter (Signed)
Submitted

## 2017-03-17 NOTE — Addendum Note (Signed)
Addended by: Caprice Beaver T on: 03/17/2017 04:31 PM   Modules accepted: Orders

## 2017-04-09 MED FILL — OTEZLA 30 MG TABS: 30 | 30 days supply | Qty: 60 | Fill #1

## 2017-04-23 DIAGNOSIS — L400 Psoriasis vulgaris: Secondary | ICD-10-CM | POA: Diagnosis not present

## 2017-04-23 DIAGNOSIS — Z79899 Other long term (current) drug therapy: Secondary | ICD-10-CM | POA: Diagnosis not present

## 2017-05-12 MED FILL — OTEZLA 30 MG TABS: 30 | 30 days supply | Qty: 60 | Fill #2

## 2017-05-20 ENCOUNTER — Other Ambulatory Visit: Payer: Self-pay | Admitting: Internal Medicine

## 2017-06-11 MED FILL — OTEZLA 30 MG TABS: 30 | 30 days supply | Qty: 60 | Fill #3

## 2017-06-25 ENCOUNTER — Ambulatory Visit: Payer: 59 | Admitting: Internal Medicine

## 2017-07-02 ENCOUNTER — Encounter: Payer: 59 | Admitting: Obstetrics and Gynecology

## 2017-07-24 ENCOUNTER — Encounter: Payer: Self-pay | Admitting: Internal Medicine

## 2017-07-24 ENCOUNTER — Ambulatory Visit (INDEPENDENT_AMBULATORY_CARE_PROVIDER_SITE_OTHER): Payer: 59 | Admitting: Internal Medicine

## 2017-07-24 VITALS — BP 130/72 | HR 104 | Wt 255.0 lb

## 2017-07-24 DIAGNOSIS — E785 Hyperlipidemia, unspecified: Secondary | ICD-10-CM

## 2017-07-24 DIAGNOSIS — Z794 Long term (current) use of insulin: Secondary | ICD-10-CM

## 2017-07-24 DIAGNOSIS — E1165 Type 2 diabetes mellitus with hyperglycemia: Secondary | ICD-10-CM | POA: Diagnosis not present

## 2017-07-24 LAB — POCT GLYCOSYLATED HEMOGLOBIN (HGB A1C): HEMOGLOBIN A1C: 6.5

## 2017-07-24 NOTE — Progress Notes (Signed)
Patient ID: Cassandra Munoz, female   DOB: 03/16/1961, 56 y.o.   MRN: [id number]  HPI: Cassandra Munoz is a 56 y.o.-year-old female, initially referred by Cassandra Munoz), for management of DM2, 2012, insulin-dependent, uncontrolled, without long term complications. Last visit 4 mo ago.  She stopped milkshakes.   Last hemoglobin A1c was: Lab Results  Component Value Date   HGBA1C 6.9 03/17/2017   HGBA1C 6.3 11/17/2016   HGBA1C 6.4 08/15/2016  2012: HbA1c 7%  She is on: - Lantus 15 >> 20 >> 25 units - added 03/2015. - Invokana 100 mg daily  - added 05/2015 - Metformin ER 1000 >> 2000 mg with dinner   Pt checks her sugars 1-3x a day: - am:125-158 >> 127-151 >> 126-146, 170 >> 127-156 >> 126-167 (higher with sweets at night) - 2h after b'fast: n/c >> 136 >> 114 >> 128 - before lunch: 106-119, 150 >> 103-116 >> 112-130 >> 111 >> 97, 98, 104 - 2h after lunch: n/c >> 119, 179 >> n/c >> 144 >> n/c  - before dinner: 100-129, 141 >> 99, 103-132 >> 104-130 >> 106-137 - 2h after dinner: 154 >> 148 >> n/c >> 160-181 >> 146 - bedtime: 102-148 >> 112, 115 >> 99-148 >> n/c >> 144 - nighttime: n/c  ? level for hypoglycemia awareness. Highest sugar was 170 >> 102 >> 167.  Glucometer: OneTouch  - No CKD, last BUN/creatinine:  Lab Results  Component Value Date   BUN 15 08/15/2016   Lab Results  Component Value Date   CREATININE 0.77 08/15/2016   - + HL; last set of lipids: Lab Results  Component Value Date   CHOL 182 12/18/2016   HDL 45.70 12/18/2016   LDLCALC 109 (H) 12/18/2016   LDLDIRECT 124.0 08/15/2016   TRIG 139.0 12/18/2016   CHOLHDL 4 12/18/2016   - last eye exam was in 11/2016 >> No DR - denies numbness and tingling in her feet.  She also has a h/o vit D def (9.8 on 02/15/2015) >> on Ergocalciferol 50,000 units 2x a week.  ROS: Constitutional: no weight gain/no weight loss, no fatigue, no subjective hyperthermia, no subjective hypothermia Eyes: no blurry vision, no  xerophthalmia ENT: no sore throat, no nodules palpated in throat, no dysphagia, no odynophagia, no hoarseness Cardiovascular: no CP/no SOB/no palpitations/no leg swelling Respiratory: no cough/no SOB/no wheezing Gastrointestinal: no N/no V/no D/no C/no acid reflux Musculoskeletal: no muscle aches/no joint aches Skin: no rashes, no hair loss Neurological: no tremors/no numbness/no tingling/no dizziness  I reviewed pt's medications, allergies, PMH, social hx, family hx, and changes were documented in the history of present illness. Otherwise, unchanged from my initial visit note.   Past Medical History:  Diagnosis Date  . Abnormal glucose   . Diabetes mellitus without complication ([place])   . Elevated cholesterol with elevated triglycerides   . Enlarged uterus   . Enlarged uterus   . Psoriasis   . Psoriasis   . Sciatic pain 2015  . Sciatic pain 2015  . Vitamin D deficiency    Past Surgical History:  Procedure Laterality Date  . ABDOMINAL HYSTERECTOMY N/A 05/07/2015   Procedure: HYSTERECTOMY ABDOMINAL;  Surgeon: Brayton Mars, MD;  Location: [facility] ORS;  Service: Gynecology;  Laterality: N/A;  . ANKLE ARTHROSCOPY Right   . COLONOSCOPY WITH PROPOFOL N/A 04/03/2016   Procedure: COLONOSCOPY WITH PROPOFOL;  Surgeon: Manya Silvas, MD;  Location[facility] END[facility]COPY;  Service: Endoscopy;  Laterality: N/A;  . ROBOTIC ASSISTED LAPAROSCOPIC VAGINAL  HYSTERECTOMY WITH FIBROID REMOVAL     Fibroid removal  . TONSILLECTOMY AND ADENOIDECTOMY     History   Social History Main Topics  . Smoking status: Never Smoker   . Smokeless tobacco: Not on file  . Alcohol Use: 0.0 oz/week    0 Standard drinks or equivalent per week  . Drug Use: No  . 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 ACCU-CHEK FASTCLIX LANCETS MISC Use to check two times daily 200 each 5  . Apremilast (OTEZLA) 30 MG TABS Take 1 tablet by mouth 2 (two) times daily. 60 tablet 5  . BD PEN NEEDLE NANO U/F 32G X 4 MM MISC USE DAILY 100 each 2  . canagliflozin (INVOKANA) 100 MG TABS tablet Take 1 tablet (100 mg total) by mouth daily. 90 tablet 3  . Continuous Blood Gluc Receiver (FREESTYLE LIBRE READER) DEVI 1 Device by Does not apply route 3 (three) times daily. 1 Device 1  . Continuous Blood Gluc Sensor (FREESTYLE LIBRE SENSOR SYSTEM) MISC 1 Device by Does not apply route every 30 (thirty) days. 3 each 11  . glucose blood (ACCU-CHEK GUIDE) test strip Use as instructed to check sugar two times daily 200 each 5  . Insulin Glargine (LANTUS SOLOSTAR) 100 UNIT/ML Solostar Pen Inject 25 Units into the skin at bedtime. 10 pen 3  . metFORMIN (GLUCOPHAGE-XR) 500 MG 24 hr tablet TAKE 4 TABLETS BY MOUTH DAILY WITH SUPPER 360 tablet 3  . Multiple Vitamin (MULTI-VITAMINS) TABS Take by mouth.    Donita Brooks external suspension   4  . Vitamin D, Ergocalciferol, (DRISDOL) 50000 units CAPS capsule TAKE 1 CAPSULE BY MOUTH 2 TIMES A WEEK. 12 capsule 6   No current facility-administered medications on file prior to visit.    Allergies  Allergen Reactions  . Clobetasol Rash    Ointment used for psorasis   Family History  Problem Relation Age of Onset  . Diabetes Mother   . Hypertension Mother   . Colon cancer Father   . Heart disease Father   . Hypertension Father   . Cancer Father   . Colon cancer Sister   . Cancer Sister   . Breast cancer Paternal Aunt        times 3   PE: BP 130/72 (BP Location: Left Arm, Patient Position: Sitting)   Pulse (!) 104   Wt 255 lb (115.7 kg)   LMP 05/01/2013   SpO2 97%   BMI 42.43 kg/m  Body mass index is 42.43 kg/m. Wt Readings from Last 3 Encounters:  07/24/17 255 lb (115.7 kg)  03/17/17 254 lb (115.2 kg)  11/17/16 252 lb (114.3 kg)    Constitutional: Obese, in NAD Eyes: PERRLA, EOMI, no exophthalmos ENT: moist mucous membranes, no thyromegaly, no cervical lymphadenopathy Cardiovascular: tachycardia, RR, No MRG Respiratory: CTA B Gastrointestinal: abdomen soft, NT, ND, BS+ Musculoskeletal: no deformities, strength intact in all 4 Skin: moist, warm, no rashes Neurological: no tremor with outstretched hands, DTR normal in all 4  ASSESSMENT: 1. DM2, insulin-dependent, w/o long term complications but with hyperglycemia.  2. HL  PLAN:  1. Patient with h/ocontrolled DM 2, with occasional Hgly, improved since last visit, but still with high CBGs in am >>  advised to check some sugars t bedtime >> if high >> may need glipizide before dinner. Discussed about reducing snacks at night, also. - at last visit, we discussed about starting FreeStyle Libre CGM >> I explained how this works and send it to her pharmacy >> was not covered when she last checked >> will check again - I suggested to:  Patient Instructions  Please continue: - Invokana 100 mg daily in am - Metformin ER 2000 mg with dinner  - Lantus 25 units at night.  Please come back for a follow-up appointment in 4 months.  - today, HbA1c is 6.5% (better) - continue checking sugars at different times of the day - check 1x a day, rotating checks - advised for yearly eye exams >> she is UTD - Return to clinic in 4 mo with sugar log    2. HL - At last visit, we discussed addition of statins, but we decided to first improve her diet (eliminated milkshakes) and recheck lipid panel afterwards. - Latest lipid panel was reviewed and it was better.-   Philemon Kingdom, MD PhD [facility] Endocrinology

## 2017-07-24 NOTE — Patient Instructions (Signed)
Please continue: - Invokana 100 mg daily in am - Metformin ER 2000 mg with dinner  - Lantus 25 units at night.  Please come back for a follow-up appointment in 4 months.

## 2017-08-05 MED FILL — OTEZLA 30 MG TABS: 30 | 30 days supply | Qty: 60 | Fill #4

## 2017-09-21 NOTE — Patient Outreach (Signed)
[facility]) Care Management  09/21/2017  TOWANNA AVERY Jul 21, 1961 [id number]   I have removed myself from the care management team for [facility].  I will continue to assist Briyana with her diabetes management through the [facility] app through [facility].   Gentry Fitz, RN, BA, [place], [place] Direct Dial:  [phone]  Fax:  [phone] E-mail: [email or web address] [address] [place], [place]  [address]

## 2017-09-30 MED FILL — OTEZLA 30 MG TABS: 30 | 30 days supply | Qty: 60 | Fill #5

## 2017-10-30 ENCOUNTER — Other Ambulatory Visit: Payer: Self-pay | Admitting: Internal Medicine

## 2017-11-02 MED FILL — OTEZLA 30 MG TABS: 30 | 30 days supply | Qty: 60 | Fill #0

## 2017-11-23 ENCOUNTER — Ambulatory Visit: Payer: 59 | Admitting: Internal Medicine

## 2017-11-23 ENCOUNTER — Ambulatory Visit (INDEPENDENT_AMBULATORY_CARE_PROVIDER_SITE_OTHER): Payer: 59 | Admitting: Internal Medicine

## 2017-11-23 ENCOUNTER — Encounter: Payer: Self-pay | Admitting: Internal Medicine

## 2017-11-23 VITALS — BP 136/88 | HR 97 | Ht 65.0 in | Wt 256.6 lb

## 2017-11-23 DIAGNOSIS — E559 Vitamin D deficiency, unspecified: Secondary | ICD-10-CM

## 2017-11-23 DIAGNOSIS — Z794 Long term (current) use of insulin: Secondary | ICD-10-CM

## 2017-11-23 DIAGNOSIS — E785 Hyperlipidemia, unspecified: Secondary | ICD-10-CM

## 2017-11-23 DIAGNOSIS — E1165 Type 2 diabetes mellitus with hyperglycemia: Secondary | ICD-10-CM | POA: Diagnosis not present

## 2017-11-23 DIAGNOSIS — L400 Psoriasis vulgaris: Secondary | ICD-10-CM | POA: Diagnosis not present

## 2017-11-23 DIAGNOSIS — Z79899 Other long term (current) drug therapy: Secondary | ICD-10-CM | POA: Diagnosis not present

## 2017-11-23 LAB — COMPLETE METABOLIC PANEL WITH GFR
AG Ratio: 1.3 (calc) (ref 1.0–2.5)
ALBUMIN MSPROF: 4.4 g/dL (ref 3.6–5.1)
ALT: 120 U/L — AB (ref 6–29)
AST: 85 U/L — ABNORMAL HIGH (ref 10–35)
Alkaline phosphatase (APISO): 57 U/L (ref 33–130)
BILIRUBIN TOTAL: 0.4 mg/dL (ref 0.2–1.2)
BUN: 16 mg/dL (ref 7–25)
CALCIUM: 10 mg/dL (ref 8.6–10.4)
CO2: 28 mmol/L (ref 20–32)
CREATININE: 0.76 mg/dL (ref 0.50–1.05)
Chloride: 104 mmol/L (ref 98–110)
GFR, EST AFRICAN AMERICAN: 102 mL/min/{1.73_m2} (ref >=60)
GFR, EST NON AFRICAN AMERICAN: 88 mL/min/{1.73_m2} (ref >=60)
GLUCOSE: 128 mg/dL — AB (ref 65–99)
Globulin: 3.4 g/dL (calc) (ref 1.9–3.7)
Potassium: 5.3 mmol/L (ref 3.5–5.3)
Sodium: 139 mmol/L (ref 135–146)
TOTAL PROTEIN: 7.8 g/dL (ref 6.1–8.1)

## 2017-11-23 LAB — MICROALBUMIN / CREATININE URINE RATIO
Creatinine,U: 110.3 mg/dL
MICROALB/CREAT RATIO: 1.8 mg/g (ref 0.0–30.0)
Microalb, Ur: 2 mg/dL — ABNORMAL HIGH (ref 0.0–1.9)

## 2017-11-23 LAB — VITAMIN D 25 HYDROXY (VIT D DEFICIENCY, FRACTURES): VITD: 36.35 ng/mL (ref 30.00–100.00)

## 2017-11-23 LAB — POCT GLYCOSYLATED HEMOGLOBIN (HGB A1C): Hemoglobin A1C: 6.9

## 2017-11-23 LAB — LIPID PANEL
Cholesterol: 223 mg/dL — ABNORMAL HIGH (ref 0–200)
HDL: 51.4 mg/dL (ref >39.00)
LDL CALC: 136 mg/dL — AB (ref 0–99)
NONHDL: 171.11
Total CHOL/HDL Ratio: 4
Triglycerides: 177 mg/dL — ABNORMAL HIGH (ref 0.0–149.0)
VLDL: 35.4 mg/dL (ref 0.0–40.0)

## 2017-11-23 MED ORDER — METFORMIN HCL ER 500 MG PO TB24: ORAL_TABLET | ORAL | 3 refills | Status: DC

## 2017-11-23 MED ORDER — VITAMIN D (ERGOCALCIFEROL) 1.25 MG (50000 UNIT) PO CAPS: ORAL_CAPSULE | ORAL | 3 refills | Status: DC

## 2017-11-23 MED ORDER — CANAGLIFLOZIN 100 MG PO TABS: 100.0000 mg | ORAL_TABLET | Freq: Every day | ORAL | 3 refills | Status: DC

## 2017-11-23 MED ORDER — INSULIN GLARGINE 100 UNIT/ML SOLOSTAR PEN: 25.0000 [IU] | PEN_INJECTOR | Freq: Every day | SUBCUTANEOUS | 3 refills | Status: DC

## 2017-11-23 MED ORDER — INSULIN PEN NEEDLE 32G X 4 MM MISC: 3 refills | Status: DC

## 2017-11-23 NOTE — Patient Instructions (Addendum)
Please stop at the lab.  Please continue: - Invokana 100 mg daily in am - Metformin ER 2000 mg with dinner  - Lantus 25 units at night.  Please come back for a follow-up appointment in 4 months.

## 2017-11-23 NOTE — Progress Notes (Signed)
Patient ID: Cassandra Munoz, female   DOB: 12/14/1960, 57 y.o.   MRN: [id number]  HPI: Cassandra Munoz is a 57 y.o.-year-old female, initially referred by Cassandra Munoz Cassandra Munoz), for management of DM2, 2012, insulin-dependent, uncontrolled, without long term complications. Last visit 4 mo ago.  Last hemoglobin A1c improved after she stopped milk shakes: Lab Results  Component Value Date   HGBA1C 6.5 07/24/2017   HGBA1C 6.9 03/17/2017   HGBA1C 6.3 11/17/2016  2012: HbA1c 7%  She is on: - Lantus 15 >> 20 >> 25 units - added 03/2015. - Invokana 100 mg daily  - added 05/2015 - Metformin ER 1000 >> 2000 mg with dinner   Pt checks her sugars 1-3x a day: - am:127-156 >> 126-167 (higher with sweets at night) >> 132-165, 177 (oreos) - 2h after b'fast: n/c >> 136 >> 114 >> 128 >> 140 - before lunch: 112-130 >> 111 >> 97, 98, 104 >> 104-122 - 2h after lunch: 119, 179 >> n/c >> 144 >> n/c >> n/c - before dinner: 99, 103-132 >> 104-130 >> 106-137 >> 118-145, 158 - 2h after dinner: 148 >> n/c >> 160-181 >> 146 >> 167, 183 - bedtime: 112, 115 >> 99-148 >> n/c >> 144 >> 132, 215 - nighttime: n/c Unclear at what level she has hypoglycemia awareness Highest sugar was 167 >> 215.  Glucometer: OneTouch  - No CKD, last BUN/creatinine:  Lab Results  Component Value Date   BUN 15 08/15/2016   Lab Results  Component Value Date   CREATININE 0.77 08/15/2016   - +HL; last set of lipids: Lab Results  Component Value Date   CHOL 182 12/18/2016   HDL 45.70 12/18/2016   LDLCALC 109 (H) 12/18/2016   LDLDIRECT 124.0 08/15/2016   TRIG 139.0 12/18/2016   CHOLHDL 4 12/18/2016  Not on statins. - last eye exam was in 11/2016: No DR - Denies numbness and tingling in her feet.  She also has a h/o vit D def (9.8 on 02/15/2015) >> on Ergocalciferol 50,000 units 2x a week.  Denies bone/joint pains. Lab Results  Component Value Date   VD25OH 60.31 08/15/2016   ROS: Constitutional: no weight gain/no weight  loss, no fatigue, + subjective hyperthermia, no subjective hypothermia Eyes: no blurry vision, no xerophthalmia ENT: no sore throat, no nodules palpated in throat, no dysphagia, no odynophagia, no hoarseness Cardiovascular: no CP/no SOB/no palpitations/no leg swelling Respiratory: no cough/no SOB/no wheezing Gastrointestinal: no N/no V/no D/no C/+ acid reflux Musculoskeletal: no muscle aches/no joint aches Skin: no rashes, no hair loss Neurological: no tremors/no numbness/no tingling/no dizziness  I reviewed pt's medications, allergies, PMH, social hx, family hx, and changes were documented in the history of present illness. Otherwise, unchanged from my initial visit note.   Past Medical History:  Diagnosis Date  . Abnormal glucose   . Diabetes mellitus without complication ([place])   . Elevated cholesterol with elevated triglycerides   . Enlarged uterus   . Enlarged uterus   . Psoriasis   . Psoriasis   . Sciatic pain 2015  . Sciatic pain 2015  . Vitamin D deficiency    Past Surgical History:  Procedure Laterality Date  . ABDOMINAL HYSTERECTOMY N/A 05/07/2015   Procedure: HYSTERECTOMY ABDOMINAL;  Surgeon: Brayton Mars, MD;  Location: [facility] ORS;  Service: Gynecology;  Laterality: N/A;  . ANKLE ARTHROSCOPY Right   . COLONOSCOPY WITH PROPOFOL N/A 04/03/2016   Procedure: COLONOSCOPY WITH PROPOFOL;  Surgeon: Manya Silvas, MD;  Location[facility]  ENDOSCOPY;  Service: Endoscopy;  Laterality: N/A;  . ROBOTIC ASSISTED LAPAROSCOPIC VAGINAL HYSTERECTOMY WITH FIBROID REMOVAL     Fibroid removal  . TONSILLECTOMY AND ADENOIDECTOMY     History   Social History Main Topics  . Smoking status: Never Smoker   . Smokeless tobacco: Not on file  . Alcohol Use: 0.0 oz/week    0 Standard drinks or equivalent per week  . Drug Use: No  . 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 ACCU-CHEK FASTCLIX LANCETS MISC Use to check two times daily 200 each 5  . Apremilast (OTEZLA) 30 MG TABS Take 1 tablet by mouth 2 (two) times daily. 60 tablet 5  . BD PEN NEEDLE NANO U/F 32G X 4 MM MISC USE DAILY 100 each 2  . canagliflozin (INVOKANA) 100 MG TABS tablet Take 1 tablet (100 mg total) by mouth daily. 90 tablet 3  . Continuous Blood Gluc Receiver (FREESTYLE LIBRE READER) DEVI 1 Device by Does not apply route 3 (three) times daily. 1 Device 1  . Continuous Blood Gluc Sensor (FREESTYLE LIBRE SENSOR SYSTEM) MISC 1 Device by Does not apply route every 30 (thirty) days. 3 each 11  . glucose blood (ACCU-CHEK GUIDE) test strip Use as instructed to check sugar two times daily 200 each 5  . Insulin Glargine (LANTUS SOLOSTAR) 100 UNIT/ML Solostar Pen Inject 25 Units into the skin at bedtime. 10 pen 3  . metFORMIN (GLUCOPHAGE-XR) 500 MG 24 hr tablet TAKE 4 TABLETS BY MOUTH DAILY WITH SUPPER 360 tablet 3  . Multiple Vitamin (MULTI-VITAMINS) TABS Take by mouth.    Donita Brooks external suspension   4  . Vitamin D, Ergocalciferol, (DRISDOL) 50000 units CAPS capsule TAKE 1 CAPSULE BY MOUTH 2 TIMES A WEEK. 12 capsule 6   No current facility-administered medications on file prior to visit.    Allergies  Allergen Reactions  . Clobetasol Rash    Ointment used for psorasis   Family History  Problem Relation Age of Onset  . Diabetes Mother   . Hypertension Mother   . Colon cancer Father   . Heart disease Father   . Hypertension Father   . Cancer Father   . Colon cancer Sister   . Cancer Sister   . Breast cancer Paternal Aunt        times 3   PE: BP 136/88   Pulse 97   Ht 5\' 5"  (1.651 m)   Wt 256 lb 9.6 oz (116.4 kg)   LMP 05/01/2013   SpO2 96%   BMI 42.70 kg/m  Body mass index is 42.7 kg/m. Wt Readings from Last 3 Encounters:  11/23/17 256 lb 9.6 oz  (116.4 kg)  07/24/17 255 lb (115.7 kg)  03/17/17 254 lb (115.2 kg)   Constitutional: Obese, in NAD Eyes: PERRLA, EOMI, no exophthalmos ENT: moist mucous membranes, no thyromegaly, no cervical lymphadenopathy Cardiovascular: tachycardia, RR, No MRG Respiratory: CTA B Gastrointestinal: abdomen soft, NT, ND, BS+ Musculoskeletal: no deformities, strength intact in all 4 Skin: moist, warm, no rashes Neurological: no tremor with outstretched hands, DTR normal in all 4  ASSESSMENT: 1. DM2, insulin-dependent, w/o long term complications but with hyperglycemia.  2. HL  PLAN:  1. Patient with history  of controlled type 2 diabetes, with occasional hyperglycemic spikes, proved at last visit, but we still high CBGs in AM.  At that time, we discussed about checking some sugars at bedtime to add glipizide before dinner.  We also discussed about reducing snacks at night, but she still continues them >> sugars higher in am.  Later in the day >> sugars are closer to goal. Discussed again about the importance of reducing sweets, but no other changes are needed in her regimen. - At last visit, we also discussed about starting FreeStyle Libre CGM >> I explained how this works and send it to her pharmacy >> this was not covered - will look again into this - I suggested to:  Patient Instructions  Please continue: - Invokana 100 mg daily in am - Metformin ER 2000 mg with dinner  - Lantus 25 units at night.  Please come back for a follow-up appointment in 4 months.  - today, HbA1c is 6.9% (slightly higher) - continue checking sugars at different times of the day - check 1-2x a day, rotating checks - advised for yearly eye exams >> she is not UTD - Return to clinic in 3 mo with sugar log     2. HL - At previous visits, we discussed about addition of statins, but we decided to first improve her diet (to eliminate milk shakes) and recheck lipid panel afterwards. - Last lipid panel was from a year ago and  it has improved - We will recheck one today  3. Vit D def - on Ergocalciferol 50,000 units 2x a week >> off for at least 2 weeks - refilled Ergocalciferol - will check today  Office Visit on 11/23/2017  Component Date Value Ref Range Status  . VITD 11/23/2017 36.35  30.00 - 100.00 ng/mL Final  . Microalb, Ur 11/23/2017 2.0* 0.0 - 1.9 mg/dL Final  . Creatinine,U 11/23/2017 110.3  mg/dL Final  . Microalb Creat Ratio 11/23/2017 1.8  0.0 - 30.0 mg/g Final  . Glucose, Bld 11/23/2017 128* 65 - 99 mg/dL Final   Comment: .            Fasting reference interval . For someone without known diabetes, a glucose value >125 mg/dL indicates that they may have diabetes and this should be confirmed with a follow-up test. .   . BUN 11/23/2017 16  7 - 25 mg/dL Final  . Creat 11/23/2017 0.76  0.50 - 1.05 mg/dL Final   Comment: For patients >49 years of age, the reference limit for Creatinine is approximately 13% higher for people identified as African-American. .   . GFR, Est Non African American 11/23/2017 88  > OR = 60 mL/min/1.29m2 Final  . GFR, Est African American 11/23/2017 102  > OR = 60 mL/min/1.20m2 Final  . BUN/Creatinine Ratio 26/71/2458 NOT APPLICABLE  6 - 22 (calc) Final  . Sodium 11/23/2017 139  135 - 146 mmol/L Final  . Potassium 11/23/2017 5.3  3.5 - 5.3 mmol/L Final  . Chloride 11/23/2017 104  98 - 110 mmol/L Final  . CO2 11/23/2017 28  20 - 32 mmol/L Final  . Calcium 11/23/2017 10.0  8.6 - 10.4 mg/dL Final  . Total Protein 11/23/2017 7.8  6.1 - 8.1 g/dL Final  . Albumin 11/23/2017 4.4  3.6 - 5.1 g/dL Final  . Globulin 11/23/2017 3.4  1.9 - 3.7 g/dL (calc) Final  . AG Ratio 11/23/2017 1.3  1.0 - 2.5 (calc) Final  . Total Bilirubin 11/23/2017 0.4  0.2 -  1.2 mg/dL Final  . Alkaline phosphatase (APISO) 11/23/2017 57  33 - 130 U/L Final  . AST 11/23/2017 85* 10 - 35 U/L Final  . ALT 11/23/2017 120* 6 - 29 U/L Final  . Cholesterol 11/23/2017 223* 0 - 200 mg/dL Final   ATP III  Classification       Desirable:  < 200 mg/dL               Borderline High:  200 - 239 mg/dL          High:  > = 240 mg/dL  . Triglycerides 11/23/2017 177.0* 0.0 - 149.0 mg/dL Final   Normal:  <150 mg/dLBorderline High:  150 - 199 mg/dL  . HDL 11/23/2017 51.40  >39.00 mg/dL Final  . VLDL 11/23/2017 35.4  0.0 - 40.0 mg/dL Final  . LDL Cholesterol 11/23/2017 136* 0 - 99 mg/dL Final  . Total CHOL/HDL Ratio 11/23/2017 4   Final                  Men          Women1/2 Average Risk     3.4          3.3Average Risk          5.0          4.42X Average Risk          9.6          7.13X Average Risk          15.0          11.0                      . NonHDL 11/23/2017 171.11   Final   NOTE:  Non-HDL goal should be 30 mg/dL higher than patient's LDL goal (i.e. LDL goal of < 70 mg/dL, would have non-HDL goal of < 100 mg/dL)  . Hemoglobin A1C 11/23/2017 6.9   Final   Labs normal, except higher LDL, high triglycerides, and also higher transaminases.  Vitamin D normal.  ACR normal. We will advised her to discuss with PCP about the increase in transaminases.  Philemon Kingdom, MD PhD [facility] Endocrinology

## 2017-12-02 MED FILL — OTEZLA 30 MG TABS: 30 | 30 days supply | Qty: 60 | Fill #1

## 2017-12-30 MED FILL — OTEZLA 30 MG TABS: 30 | 30 days supply | Qty: 60 | Fill #2

## 2018-02-01 ENCOUNTER — Other Ambulatory Visit: Payer: Self-pay | Admitting: Pharmacist

## 2018-02-01 MED ORDER — APREMILAST 30 MG PO TABS: 1.0000 | ORAL_TABLET | Freq: Two times a day (BID) | ORAL | 5 refills | Status: DC

## 2018-02-01 MED FILL — OTEZLA 30 MG TABS: 30 | 30 days supply | Qty: 60 | Fill #0

## 2018-03-03 ENCOUNTER — Telehealth: Payer: Self-pay | Admitting: Pharmacist

## 2018-03-03 ENCOUNTER — Ambulatory Visit (INDEPENDENT_AMBULATORY_CARE_PROVIDER_SITE_OTHER): Payer: 59 | Admitting: Pharmacist

## 2018-03-03 DIAGNOSIS — Z79899 Other long term (current) drug therapy: Secondary | ICD-10-CM

## 2018-03-03 MED ORDER — APREMILAST 30 MG PO TABS: 1.0000 | ORAL_TABLET | Freq: Two times a day (BID) | ORAL | 5 refills | Status: DC

## 2018-03-03 NOTE — Progress Notes (Signed)
   S: Patient presents today for review of her specialty medication.   Patient is currently taking Otezla for psoriatic arthritis. Patient is managed by Dr. Nehemiah Massed for this.   Adherence: denies any missed doses  Efficacy: still feels like it is working well overall. She still has some flares on some days but feels like they are manageable.   Monitoring: Weight loss: denies GI upset: denies Headache: denies S/sx of infection: denies Neuropsychiatric effects: denies   O:     Lab Results  Component Value Date   WBC 6.5 05/02/2015   HGB 13.3 05/08/2015   HCT 42.3 05/02/2015   MCV 88.3 05/02/2015   PLT 256 05/02/2015      Chemistry      Component Value Date/Time   NA 139 11/23/2017 1049   K 5.3 11/23/2017 1049   CL 104 11/23/2017 1049   CO2 28 11/23/2017 1049   BUN 16 11/23/2017 1049   CREATININE 0.76 11/23/2017 1049      Component Value Date/Time   CALCIUM 10.0 11/23/2017 1049   ALKPHOS 53 08/15/2016 1617   AST 85 (H) 11/23/2017 1049   ALT 120 (H) 11/23/2017 1049   BILITOT 0.4 11/23/2017 1049       A/P: 1. Medication review: patient on Otezla for psoriatic arthritis and is tolerating well with improved control of her psoriatic arthritis. She reports that she feels her arthritis is better controlled on [place]. Reviewed the medication with her, including the following: the need for regular follow up with rheumatologist, side effects, such as headache, GI upset, and increased risk of infection. No recommendations for changes.    Christella Hartigan, PharmD, BCPS, BCACP, CPP Clinical Pharmacist Practitioner  [phone]

## 2018-03-03 NOTE — Addendum Note (Signed)
Addended by: Rica Mast on: 03/03/2018 11:58 AM   Modules accepted: Orders

## 2018-03-03 NOTE — Telephone Encounter (Signed)
Called patient to schedule an appointment for the [facility]. I was unable to reach the patient so I left a HIPAA-compliant message requesting that the patient return my call. Will also send secure email.

## 2018-03-11 MED FILL — OTEZLA 30 MG TABS: 30 | 30 days supply | Qty: 60 | Fill #0

## 2018-03-23 ENCOUNTER — Ambulatory Visit (INDEPENDENT_AMBULATORY_CARE_PROVIDER_SITE_OTHER): Payer: 59 | Admitting: Internal Medicine

## 2018-03-23 ENCOUNTER — Encounter: Payer: Self-pay | Admitting: Internal Medicine

## 2018-03-23 VITALS — BP 130/72 | HR 112 | Ht 65.0 in | Wt 254.0 lb

## 2018-03-23 DIAGNOSIS — E559 Vitamin D deficiency, unspecified: Secondary | ICD-10-CM

## 2018-03-23 DIAGNOSIS — E1165 Type 2 diabetes mellitus with hyperglycemia: Secondary | ICD-10-CM

## 2018-03-23 DIAGNOSIS — E785 Hyperlipidemia, unspecified: Secondary | ICD-10-CM | POA: Diagnosis not present

## 2018-03-23 DIAGNOSIS — Z794 Long term (current) use of insulin: Secondary | ICD-10-CM | POA: Diagnosis not present

## 2018-03-23 LAB — POCT GLYCOSYLATED HEMOGLOBIN (HGB A1C): Hemoglobin A1C: 6.8 % — AB (ref 4.0–5.6)

## 2018-03-23 NOTE — Progress Notes (Signed)
Patient ID: Cassandra Munoz, female   DOB: 1961/10/18, 57 y.o.   MRN: [id number]  HPI: Cassandra Munoz is a 57 y.o.-year-old female, initially referred by [name] [facility]), for management of DM2, 2012, insulin-dependent, uncontrolled, without long term complications. Last visit 4 months ago.   She will see Dr. Marcelline Mates.  At last visit, during labs we found that she had transaminitis. We discussed to see her PCP for this. She will see her next mo.  She developed a reaction to Swerve (sugar substituent) recently >> GI sxs, sugars were higher.  HbA1c levels: Lab Results  Component Value Date   HGBA1C 6.8 (A) 03/23/2018   HGBA1C 6.9 11/23/2017   HGBA1C 6.5 07/24/2017  2012: HbA1c 7%  She is on: - Lantus 15 >> 20 >> 25 units - added 03/2015. - Invokana 100 mg daily  - added 05/2015 - Metformin ER 1000 >> 2000 mg with dinner  Pt checks her sugars 1-3X a day - am:127-156 >> 126-167 >> 132-165, 177 (oreos) >> 124-152 - 2h after b'fast: n/c >> 136 >> 114 >> 128 >> n/c - before lunch: 112-130 >> 111 >> 97, 98, 104 >> 104-122 >> 109-146 - 2h after lunch: 119, 179 >> n/c >> 144 >> n/c >> n/c - before dinner: 104-130 >> 106-137 >> 118-145, 158 >> 116-146 - 2h after dinner: n/c >> 160-181 >> 146 >> 167, 183 >> n/c - bedtime: 99-148 >> n/c >> 144 >> 132, 215 >> 145-152, 204 (sick) - nighttime: n/c It is unclear at which level she has hypoglycemia awareness Highest sugar was 167 >> 215 >> 200s when developed the rxn to Swerve.  Glucometer: OneTouch  -No CKD, last BUN/creatinine:  Lab Results  Component Value Date   BUN 16 11/23/2017   Lab Results  Component Value Date   CREATININE 0.76 11/23/2017   - +HL; last set of lipids: Lab Results  Component Value Date   CHOL 223 (H) 11/23/2017   HDL 51.40 11/23/2017   LDLCALC 136 (H) 11/23/2017   LDLDIRECT 124.0 08/15/2016   TRIG 177.0 (H) 11/23/2017   CHOLHDL 4 11/23/2017  Not on a statin - last eye exam was in 11/2016: No DR - Denies  numbness and tingling in her feet.  She also has a h/o vit D def (9.8 on 02/15/2015) >> on ergocalciferol 50,000 units 2x a week >> latest level was normal at last visit: Lab Results  Component Value Date   VD25OH 36.35 11/23/2017   VD25OH 60.31 08/15/2016   Denies bone or joint pains.  ROS: Constitutional: no weight gain/no weight loss, no fatigue, no subjective hyperthermia, no subjective hypothermia Eyes: no blurry vision, no xerophthalmia ENT: no sore throat, no nodules palpated in throat, no dysphagia, no odynophagia, no hoarseness Cardiovascular: no CP/no SOB/no palpitations/no leg swelling Respiratory: no cough/no SOB/no wheezing Gastrointestinal: no N/+ V/+ D/no C/+ acid reflux Musculoskeletal: no muscle aches/no joint aches Skin: no rashes, no hair loss Neurological: no tremors/no numbness/no tingling/no dizziness  I reviewed pt's medications, allergies, PMH, social hx, family hx, and changes were documented in the history of present illness. Otherwise, unchanged from my initial visit note.  Past Medical History:  Diagnosis Date  . Abnormal glucose   . Diabetes mellitus without complication ([place])   . Elevated cholesterol with elevated triglycerides   . Enlarged uterus   . Enlarged uterus   . Psoriasis   . Psoriasis   . Sciatic pain 2015  . Sciatic pain 2015  . Vitamin  D deficiency    Past Surgical History:  Procedure Laterality Date  . ABDOMINAL HYSTERECTOMY N/A 05/07/2015   Procedure: HYSTERECTOMY ABDOMINAL;  Surgeon: Brayton Mars, MD;  Location: [facility] ORS;  Service: Gynecology;  Laterality: N/A;  . ANKLE ARTHROSCOPY Right   . COLONOSCOPY WITH PROPOFOL N/A 04/03/2016   Procedure: COLONOSCOPY WITH PROPOFOL;  Surgeon: Manya Silvas, MD;  Location: [facility] ENDOSCOPY;  Service: Endoscopy;  Laterality: N/A;  . ROBOTIC ASSISTED LAPAROSCOPIC VAGINAL HYSTERECTOMY WITH FIBROID REMOVAL     Fibroid removal  . TONSILLECTOMY AND ADENOIDECTOMY     History   Social  History Main Topics  . Smoking status: Never Smoker   . Smokeless tobacco: Not on file  . Alcohol Use: 0.0 oz/week    0 Standard drinks or equivalent per week  . Drug Use: No  . 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 ACCU-CHEK FASTCLIX LANCETS MISC Use to check two times daily 200 each 5  . Apremilast (OTEZLA) 30 MG TABS Take 1 tablet by mouth 2 (two) times daily. 60 tablet 5  . canagliflozin (INVOKANA) 100 MG TABS tablet Take 1 tablet (100 mg total) by mouth daily. 90 tablet 3  . glucose blood (ACCU-CHEK GUIDE) test strip Use as instructed to check sugar two times daily 200 each 5  . Insulin Glargine (LANTUS SOLOSTAR) 100 UNIT/ML Solostar Pen Inject 25 Units into the skin at bedtime. 10 pen 3  . Insulin Pen Needle (BD PEN NEEDLE NANO U/F) 32G X 4 MM MISC USE DAILY 100 each 3  . metFORMIN (GLUCOPHAGE-XR) 500 MG 24 hr tablet TAKE 4 TABLETS BY MOUTH DAILY WITH SUPPER 360 tablet 3  . Multiple Vitamin (MULTI-VITAMINS) TABS Take by mouth.    . Vitamin D, Ergocalciferol, (DRISDOL) 50000 units CAPS capsule TAKE 1 CAPSULE BY MOUTH 2 TIMES A WEEK. 24 capsule 3  . Continuous Blood Gluc Receiver (FREESTYLE LIBRE READER) DEVI 1 Device by Does not apply route 3 (three) times daily. (Patient not taking: Reported on 03/23/2018) 1 Device 1  . Continuous Blood Gluc Sensor (FREESTYLE LIBRE SENSOR SYSTEM) MISC 1 Device by Does not apply route every 30 (thirty) days. (Patient not taking: Reported on 03/23/2018) 3 each 11   No current facility-administered medications on file prior to visit.    Allergies  Allergen Reactions  . Clobetasol Rash    Ointment used for psorasis   Family History  Problem Relation Age of Onset  . Diabetes Mother   . Hypertension Mother   .  Colon cancer Father   . Heart disease Father   . Hypertension Father   . Cancer Father   . Colon cancer Sister   . Cancer Sister   . Breast cancer Paternal Aunt        times 3   PE: BP 130/72 (BP Location: Right Arm, Patient Position: Sitting, Cuff Size: Large)   Pulse (!) 112   Ht 5\' 5"  (1.651 m)   Wt 254 lb (115.2 kg)   LMP 05/01/2013   SpO2 94%   BMI 42.27 kg/m  Body mass index is 42.27 kg/m. Wt Readings from Last 3 Encounters:  03/23/18 254 lb (115.2 kg)  11/23/17 256 lb 9.6 oz (116.4 kg)  07/24/17 255  lb (115.7 kg)   Constitutional: overweight, in NAD Eyes: PERRLA, EOMI, no exophthalmos ENT: moist mucous membranes, no thyromegaly, no cervical lymphadenopathy Cardiovascular: Tachycardia, RR, No MRG Respiratory: CTA B Gastrointestinal: abdomen soft, NT, ND, BS+ Musculoskeletal: no deformities, strength intact in all 4 Skin: moist, warm, no rashes Neurological: no tremor with outstretched hands, DTR normal in all 4  ASSESSMENT: 1. DM2, insulin-dependent, w/o long term complications but with hyperglycemia.  2. HL  PLAN:  1. Patient with   history of controlled type 2 diabetes, with occasional hyperglycemic spikes, proved at last visit, but we still high CBGs in AM.  At that time, we discussed about checking some sugars at bedtime to add glipizide before dinner.  We also discussed about reducing snacks at night, but she still continues them >> sugars higher in am.  Later in the day >> sugars are closer to goal. Discussed again about the importance of reducing sweets, but no other changes are needed in her regimen. - at this visit, sugars are at or close to goal except when she has GI sxs from Swerve sweetener - no need to change her regimen for now - I suggested to:  Patient Instructions  Please continue: - Invokana 100 mg in a.m. - Metformin ER 2000 mg with dinner  - Lantus 25 units at night.  Please come back for a follow-up appointment in 4 months.  - today,  HbA1c is 6.8% (slightly better) - continue checking sugars at different times of the day - check 1x a day, rotating checks - advised for yearly eye exams >> she is UTD - Return to clinic in 3 mo with sugar log      2. HL - Reviewed latest lipid panel from last visit: Worse -  refused statins in the past trying to improve her diet  3. Vit D def -On ergocalciferol 50,000 units twice a week - latest vitamin D level was normal at last visit   Philemon Kingdom, MD PhD [facility] Endocrinology

## 2018-03-23 NOTE — Patient Instructions (Signed)
Please continue: - Invokana 100 mg in a.m. - Metformin ER 2000 mg with dinner - Lantus 25 units at night.  Please come back for a follow-up appointment in 4 months.  

## 2018-04-12 ENCOUNTER — Encounter: Payer: Self-pay | Admitting: Obstetrics and Gynecology

## 2018-04-12 ENCOUNTER — Ambulatory Visit (INDEPENDENT_AMBULATORY_CARE_PROVIDER_SITE_OTHER): Payer: 59 | Admitting: Obstetrics and Gynecology

## 2018-04-12 VITALS — BP 145/85 | HR 120 | Ht 65.0 in | Wt 254.3 lb

## 2018-04-12 DIAGNOSIS — L918 Other hypertrophic disorders of the skin: Secondary | ICD-10-CM

## 2018-04-12 DIAGNOSIS — N951 Menopausal and female climacteric states: Secondary | ICD-10-CM

## 2018-04-12 DIAGNOSIS — E785 Hyperlipidemia, unspecified: Secondary | ICD-10-CM

## 2018-04-12 DIAGNOSIS — Z794 Long term (current) use of insulin: Secondary | ICD-10-CM | POA: Diagnosis not present

## 2018-04-12 DIAGNOSIS — Z1231 Encounter for screening mammogram for malignant neoplasm of breast: Secondary | ICD-10-CM

## 2018-04-12 DIAGNOSIS — E1165 Type 2 diabetes mellitus with hyperglycemia: Secondary | ICD-10-CM | POA: Diagnosis not present

## 2018-04-12 DIAGNOSIS — Z01419 Encounter for gynecological examination (general) (routine) without abnormal findings: Secondary | ICD-10-CM

## 2018-04-12 DIAGNOSIS — Z1239 Encounter for other screening for malignant neoplasm of breast: Secondary | ICD-10-CM

## 2018-04-12 MED ORDER — NALTREXONE-BUPROPION HCL ER 8-90 MG PO TB12: ORAL_TABLET | ORAL | 2 refills | Status: DC

## 2018-04-12 MED ORDER — NYSTATIN-TRIAMCINOLONE 100000-0.1 UNIT/GM-% EX CREA: 1.0000 "application " | TOPICAL_CREAM | Freq: Two times a day (BID) | CUTANEOUS | 2 refills | Status: AC

## 2018-04-12 MED ORDER — PAROXETINE MESYLATE 7.5 MG PO CAPS: 1.0000 | ORAL_CAPSULE | Freq: Every day | ORAL | 11 refills | Status: DC

## 2018-04-12 NOTE — Progress Notes (Signed)
Pt is present today for her annual exam. Pt stated that she would like a cream for itching due to the diabetic medication that she is taking. No other concerns.

## 2018-04-12 NOTE — Progress Notes (Signed)
ANNUAL PREVENTATIVE CARE GYNECOLOGY  ENCOUNTER NOTE  Subjective:       Cassandra Munoz is a 57 y.o. G0P0000 menopausal female here for a routine annual gynecologic exam. The patient is sexually active. The patient has never been taking hormone replacement therapy. Patient denies post-menopausal vaginal bleeding. The patient wears seatbelts: yes. The patient participates in regular exercise: no. Has the patient ever been transfused or tattooed?: no. The patient reports that there is not domestic violence in her life.  Current complaints: 1.  Patient reports bothersome hot flashes and night sweats since her hysterectomy.  Has been ongoing for at least 2 years.  Has just been dealing with it.  2. Patient notes a desire to attempt to lose weight.  States that she has tried several times in the past to lose weight with diet and exercise, but has never really been successful. Has begun trying to modify her diet over the pas several weeks.  Notes that she has considered bariatric surgery, but is still hesitant regarding having another surgical procedure   Gynecologic History Patient's last menstrual period was 05/01/2013. Contraception: post menopausal status Last Pap: 01/2015. Results were: normal Last mammogram: 04/2015. Results were: normal Last Colonoscopy: 04/03/2016. Results were: normal   Obstetric History OB History  Gravida Para Term Preterm AB Living  0 0 0 0 0 0  SAB TAB Ectopic Multiple Live Births  0 0 0 0 0    Past Medical History:  Diagnosis Date  . Abnormal glucose   . Diabetes mellitus without complication ([place])   . Elevated cholesterol with elevated triglycerides   . Enlarged uterus   . Enlarged uterus   . Psoriasis   . Psoriasis   . Sciatic pain 2015  . Sciatic pain 2015  . Vitamin D deficiency     Family History  Problem Relation Age of Onset  . Diabetes Mother   . Hypertension Mother   . Colon cancer Father   . Heart disease Father   . Hypertension Father     . Cancer Father   . Colon cancer Sister   . Cancer Sister   . Breast cancer Paternal Aunt        times 3    Past Surgical History:  Procedure Laterality Date  . ABDOMINAL HYSTERECTOMY N/A 05/07/2015   Procedure: HYSTERECTOMY ABDOMINAL;  Surgeon: Brayton Mars, MD;  Location: [facility] ORS;  Service: Gynecology;  Laterality: N/A;  . ANKLE ARTHROSCOPY Right   . COLONOSCOPY WITH PROPOFOL N/A 04/03/2016   Procedure: COLONOSCOPY WITH PROPOFOL;  Surgeon: Manya Silvas, MD;  Location: [facility] ENDOSCOPY;  Service: Endoscopy;  Laterality: N/A;  . ROBOTIC ASSISTED LAPAROSCOPIC VAGINAL HYSTERECTOMY WITH FIBROID REMOVAL     Fibroid removal  . TONSILLECTOMY AND ADENOIDECTOMY      Social History   Socioeconomic History  . Marital status: Married    Spouse name: Not on file  . Number of children: Not on file  . Years of education: Not on file  . Highest education level: Not on file  Occupational History  . Not on file  Social Needs  . Financial resource strain: Not on file  . Food insecurity:    Worry: Not on file    Inability: Not on file  . Transportation needs:    Medical: Not on file    Non-medical: Not on file  Tobacco Use  . Smoking status: Never Smoker  . Smokeless tobacco: Never Used  Substance and Sexual Activity  . Alcohol  use: No    Alcohol/week: 0.0 oz    Comment: maybe 1 drink/month - socially  . Drug use: No  . Sexual activity: Yes    Birth control/protection: None  Lifestyle  . Physical activity:    Days per week: Not on file    Minutes per session: Not on file  . Stress: Not on file  Relationships  . Social connections:    Talks on phone: Not on file    Gets together: Not on file    Attends religious service: Not on file    Active member of club or organization: Not on file    Attends meetings of clubs or organizations: Not on file    Relationship status: Not on file  . Intimate partner violence:    Fear of current or ex partner: Not on file     Emotionally abused: Not on file    Physically abused: Not on file    Forced sexual activity: Not on file  Other Topics Concern  . 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 ACCU-CHEK FASTCLIX LANCETS MISC Use to check two times daily 200 each 5  . Apremilast (OTEZLA) 30 MG TABS Take 1 tablet by mouth 2 (two) times daily. 60 tablet 5  . canagliflozin (INVOKANA) 100 MG TABS tablet Take 1 tablet (100 mg total) by mouth daily. 90 tablet 3  . glucose blood (ACCU-CHEK GUIDE) test strip Use as instructed to check sugar two times daily 200 each 5  . Insulin Glargine (LANTUS SOLOSTAR) 100 UNIT/ML Solostar Pen Inject 25 Units into the skin at bedtime. 10 pen 3  . Insulin Pen Needle (BD PEN NEEDLE NANO U/F) 32G X 4 MM MISC USE DAILY 100 each 3  . metFORMIN (GLUCOPHAGE-XR) 500 MG 24 hr tablet TAKE 4 TABLETS BY MOUTH DAILY WITH SUPPER 360 tablet 3  . Multiple Vitamin (MULTI-VITAMINS) TABS Take by mouth.    . Vitamin D, Ergocalciferol, (DRISDOL) 50000 units CAPS capsule TAKE 1 CAPSULE BY MOUTH 2 TIMES A WEEK. 24 capsule 3  . Continuous Blood Gluc Receiver (FREESTYLE LIBRE READER) DEVI 1 Device by Does not apply route 3 (three) times daily. (Patient not taking: Reported on 03/23/2018) 1 Device 1  . Continuous Blood Gluc Sensor (FREESTYLE LIBRE SENSOR SYSTEM) MISC 1 Device by Does not apply route every 30 (thirty) days. (Patient not taking: Reported on 03/23/2018) 3 each 11   No current facility-administered medications on file prior to visit.     Allergies  Allergen Reactions  . Clobetasol Rash    Ointment used for psorasis      Review of Systems ROS Review of Systems - General ROS: negative for - chills, fatigue, fever, weight gain or weight loss. Positive for hot  flashes, night sweats Psychological ROS: negative for - anxiety, decreased libido, depression, mood swings, physical abuse or sexual abuse Ophthalmic ROS: negative for - blurry vision, eye pain or loss of vision ENT ROS: negative for - headaches, hearing change, visual changes or vocal changes Allergy and Immunology ROS: negative for - hives, itchy/watery eyes or seasonal allergies Hematological and Lymphatic ROS: negative for - bleeding problems, bruising, swollen lymph nodes or weight loss Endocrine ROS: negative for - galactorrhea,  hair pattern changes, hot flashes, malaise/lethargy, mood swings, palpitations, polydipsia/polyuria, skin changes, temperature intolerance or unexpected weight changes Breast ROS: negative for - new or changing breast lumps or nipple discharge Respiratory ROS: negative for - cough or shortness of breath Cardiovascular ROS: negative for - chest pain, irregular heartbeat, palpitations or shortness of breath Gastrointestinal ROS: no abdominal pain, change in bowel habits, or black or bloody stools Genito-Urinary ROS: no dysuria, trouble voiding, or hematuria Musculoskeletal ROS: negative for - joint pain or joint stiffness Neurological ROS: negative for - bowel and bladder control changes Dermatological ROS: negative for rash and skin lesion changes   Objective:   BP (!) 145/85   Pulse (!) 120   Ht 5\' 5"  (1.651 m)   Wt 254 lb 4.8 oz (115.3 kg)   LMP 05/01/2013   BMI 42.32 kg/m  CONSTITUTIONAL: Well-developed, morbidly obese female in no acute distress.  PSYCHIATRIC: Normal mood and affect. Normal behavior. Normal judgment and thought content. [place]: Alert and oriented to person, place, and time. Normal muscle tone coordination. No cranial nerve deficit noted. HENT:  Normocephalic, atraumatic, External right and left ear normal. Oropharynx is clear and moist EYES: Conjunctivae and EOM are normal. Pupils are equal, round, and reactive to light. No scleral  icterus.  NECK: Normal range of motion, supple, no masses.  Normal thyroid.  SKIN: Skin is warm and dry. No rash noted. Not diaphoretic. No erythema. No pallor.  Small skin tag noted on left inner thigh.  CARDIOVASCULAR: Normal heart rate noted, regular rhythm, no murmur. RESPIRATORY: Clear to auscultation bilaterally. Effort and breath sounds normal, no problems with respiration noted. BREASTS: Symmetric in size. No masses, skin changes, nipple drainage, or lymphadenopathy. ABDOMEN: Soft, normal bowel sounds, no distention noted.  No tenderness, rebound or guarding.  BLADDER: Normal PELVIC:  Bladder no bladder distension noted  Urethra: normal appearing urethra with no masses, tenderness or lesions  Vulva: normal appearing vulva with no masses, tenderness or lesions  Vagina: normal appearing vagina with normal color and discharge, no lesions  Cervix: normal appearing cervix without discharge or lesions  Uterus: surgically absent  Adnexa: normal adnexa in size, nontender and no masses  RV: External Exam NormaI, No Rectal Masses and Normal Sphincter tone  MUSCULOSKELETAL: Normal range of motion. No tenderness.  No cyanosis, clubbing, or edema.  2+ distal pulses. LYMPHATIC: No Axillary, Supraclavicular, or Inguinal Adenopathy.   Labs: Lab Results  Component Value Date   WBC 6.5 05/02/2015   HGB 13.3 05/08/2015   HCT 42.3 05/02/2015   MCV 88.3 05/02/2015   PLT 256 05/02/2015    Lab Results  Component Value Date   CREATININE 0.76 11/23/2017   BUN 16 11/23/2017   NA 139 11/23/2017   K 5.3 11/23/2017   CL 104 11/23/2017   CO2 28 11/23/2017    Lab Results  Component Value Date   ALT 120 (H) 11/23/2017   AST 85 (H) 11/23/2017   ALKPHOS 53 08/15/2016   BILITOT 0.4 11/23/2017    Lab Results  Component Value Date   CHOL 223 (H) 11/23/2017   HDL 51.40 11/23/2017   LDLCALC 136 (H) 11/23/2017   LDLDIRECT 124.0 08/15/2016   TRIG 177.0 (H) 11/23/2017   CHOLHDL 4 11/23/2017     No results found for: TSH  Lab Results  Component Value Date   HGBA1C 6.8 (A) 03/23/2018     Assessment:   Well woman exam with routine gynecological exam Breast cancer screening Menopausal vasomotor syndrome Morbid obesity  Diabetes mellitus Hyperlipidemia Skin tag  Plan:  - Pap: Pap Co Test performed.  - Mammogram: Ordered - Stool Guaiac Testing:  Not Indicated.  Patient up to date with colonoscopy.  - Labs: TSH. All other labs reviewed.  - Routine preventative health maintenance measures emphasized: Exercise/Diet/Weight control, Alcohol/Substance use risks and Stress Management - Patient with bothersome menopausal vasomotor symptoms. Discussed lifestyle interventions such as wearing light clothing, remaining in cool environments, having fan/air conditioner in the room, avoiding hot beverages etc.  Discussed using hormone therapy and concerns about increased risk of heart disease, cerebrovascular disease, thromboembolic disease,  and breast cancer.  Also discussed other medical options such as Paxil, Effexor, Brisdelle, Clonidine,  or Neurontin.   Also discussed alternative therapies such as herbal remedies but cautioned that most of the products contained phytoestrogens (plant estrogens) in unregulated amounts which can have the same effects on the body as the pharmaceutical estrogen preparations.  Also referred her to www.[email or web address].org for other alternative options.  Patient opted for non-hormonal medication with Paxil.  - Discussed weight loss with patient and option of starting a weight loss medication to help as she has never tried this before.  Also discussed that if bariatric surgery were indeed an option, that it would be a requirement to show that she was able to lose some weight successfully. Advised on use of medication in conjunction with exercise and diet.   - Diabetes and hyperlipidemia managed by her Endocrinologist. - Skin tag on left thigh, patient desires removal  as it is irritating her. Can schedule at next visit.  - Patient desires refill on her skin cream in case of recurrence of vulvar yeast infections due to her diabetes and obesity. Will refill. Return to Clinic - 1 month to f/u vasomotor symptoms, weight loss check, and remove skin tag.  Return in 1 year for annual exam.     Rubie Maid, MD [facility]

## 2018-04-13 LAB — TSH: TSH: 2.27 u[IU]/mL (ref 0.450–4.500)

## 2018-04-14 ENCOUNTER — Encounter: Payer: Self-pay | Admitting: Obstetrics and Gynecology

## 2018-04-14 DIAGNOSIS — H524 Presbyopia: Secondary | ICD-10-CM | POA: Diagnosis not present

## 2018-04-15 LAB — IGP, COBASHPV16/18
HPV 16: NEGATIVE
HPV 18: NEGATIVE
HPV other hr types: NEGATIVE
PAP Smear Comment: 0

## 2018-04-19 ENCOUNTER — Other Ambulatory Visit: Payer: Self-pay | Admitting: Internal Medicine

## 2018-05-07 MED FILL — OTEZLA 30 MG TABS: 30 | 30 days supply | Qty: 60 | Fill #1

## 2018-05-12 ENCOUNTER — Encounter: Payer: 59 | Admitting: Obstetrics and Gynecology

## 2018-05-14 ENCOUNTER — Other Ambulatory Visit: Payer: Self-pay | Admitting: Obstetrics and Gynecology

## 2018-05-14 DIAGNOSIS — E119 Type 2 diabetes mellitus without complications: Secondary | ICD-10-CM

## 2018-05-14 DIAGNOSIS — Z6841 Body Mass Index (BMI) 40.0 and over, adult: Principal | ICD-10-CM

## 2018-05-14 MED ORDER — LORCASERIN HCL 10 MG PO TABS: 10.0000 mg | ORAL_TABLET | Freq: Two times a day (BID) | ORAL | 3 refills | Status: DC

## 2018-06-03 ENCOUNTER — Other Ambulatory Visit: Payer: Self-pay

## 2018-06-03 NOTE — Telephone Encounter (Signed)
Pt was called to see if she could make an appointment to visit [facility] to discussion other options for treatment since her insurance will not cover the medications prescribed. Pt did not answer the phone and message was left via voicemail to call the office to make an appointment to see [facility].

## 2018-06-07 DIAGNOSIS — L400 Psoriasis vulgaris: Secondary | ICD-10-CM | POA: Diagnosis not present

## 2018-06-10 ENCOUNTER — Other Ambulatory Visit: Payer: Self-pay

## 2018-06-10 NOTE — Telephone Encounter (Signed)
Pt called concerning issues with her medication not being covered by her insurance. Pt was informed that [facility] wanted to see her in the office to discuss other options. Pt made an appointment to see [facility].

## 2018-06-15 ENCOUNTER — Ambulatory Visit (INDEPENDENT_AMBULATORY_CARE_PROVIDER_SITE_OTHER): Payer: 59 | Admitting: Obstetrics and Gynecology

## 2018-06-15 ENCOUNTER — Encounter: Payer: Self-pay | Admitting: Obstetrics and Gynecology

## 2018-06-15 DIAGNOSIS — I10 Essential (primary) hypertension: Secondary | ICD-10-CM

## 2018-06-15 DIAGNOSIS — E119 Type 2 diabetes mellitus without complications: Secondary | ICD-10-CM | POA: Diagnosis not present

## 2018-06-15 MED FILL — OTEZLA 30 MG TABS: 30 | 30 days supply | Qty: 60 | Fill #2

## 2018-06-15 NOTE — Progress Notes (Signed)
Pt stated that she is doing well no problems.

## 2018-06-17 ENCOUNTER — Encounter: Payer: Self-pay | Admitting: Obstetrics and Gynecology

## 2018-06-17 NOTE — Progress Notes (Signed)
    GYNECOLOGY PROGRESS NOTE  Subjective:    Patient ID: Cassandra Munoz, female    DOB: 10/24/1961, 57 y.o.   MRN: [id number]  HPI  Patient is a 57 y.o. G0P0000 female who presents for further discussion of weight loss management.  Attempts have been make to prescribe Contrave, as well Belviq, however both medications declined by insurance.  Does not desire to try older weight loss agents such as Phentermine.  Has tried diets, seen nutritionists, weight loss programs in the past, with no success. Is not ready to consider bariatric surgery at this time.     The following portions of the patient's history were reviewed and updated as appropriate: allergies, current medications, past family history, past medical history, past social history, past surgical history and problem list.  Review of Systems A comprehensive review of systems was negative.   Objective:   Blood pressure (!) 141/80, pulse (!) 120, height 5\' 5"  (1.651 m), weight 254 lb 6.4 oz (115.4 kg), last menstrual period 05/01/2013.  Body mass index is 42.33 kg/m.  General appearance: alert and no distress Remainder of exam deferred.    Assessment:   Morbid obesity HTN DM  Plan:   Discussed options again with patient including:   1. Contacting insurance company to appeal denial of coverage of medication   2. Trying OTC weight loss management supplements (Alli, Green tea extract, coffee bean extract)    3. Consideration of older prescription medications (Phentermine)  Patient desires to try OTC medications/supplements, but also request inquiry to insurance company as to the reasons for the denial, as she is medical co-morbidities that would likely be benefited from weight loss management.  Also discussed patient's diet in detail, given instructions on reduction of "bad" carbs, increasing intake (may eat up to 2 meals per day). Will notify patient once discussion had with insurance company.    A total of 15 minutes were  spent face-to-face with the patient during this encounter and over half of that time dealt with counseling and coordination of care.   Rubie Maid, MD [facility]

## 2018-07-20 MED FILL — OTEZLA 30 MG TABS: 30 | 30 days supply | Qty: 60 | Fill #3

## 2018-07-26 ENCOUNTER — Encounter: Payer: Self-pay | Admitting: Internal Medicine

## 2018-07-26 ENCOUNTER — Ambulatory Visit (INDEPENDENT_AMBULATORY_CARE_PROVIDER_SITE_OTHER): Payer: 59 | Admitting: Internal Medicine

## 2018-07-26 VITALS — BP 138/80 | HR 104 | Ht 65.0 in | Wt 253.0 lb

## 2018-07-26 DIAGNOSIS — Z794 Long term (current) use of insulin: Secondary | ICD-10-CM

## 2018-07-26 DIAGNOSIS — E559 Vitamin D deficiency, unspecified: Secondary | ICD-10-CM | POA: Diagnosis not present

## 2018-07-26 DIAGNOSIS — E785 Hyperlipidemia, unspecified: Secondary | ICD-10-CM

## 2018-07-26 DIAGNOSIS — E1165 Type 2 diabetes mellitus with hyperglycemia: Secondary | ICD-10-CM

## 2018-07-26 LAB — POCT GLYCOSYLATED HEMOGLOBIN (HGB A1C): Hemoglobin A1C: 6.8 % — AB (ref 4.0–5.6)

## 2018-07-26 NOTE — Progress Notes (Signed)
Patient ID: Cassandra Munoz, female   DOB: 02/27/1961, 57 y.o.   MRN: [id number]  HPI: Cassandra Munoz is a 57 y.o.-year-old female, initially referred by [name] [facility]), for management of DM2, 2012, insulin-dependent, uncontrolled, without long term complications. Last visit 4 months ago. She will see Dr. Marcelline Mates.  HbA1c levels: Lab Results  Component Value Date   HGBA1C 6.8 (A) 03/23/2018   HGBA1C 6.9 11/23/2017   HGBA1C 6.5 07/24/2017  2012: HbA1c 7%  She is on: - Lantus 15 >> 20 >> 25 units-added 03/2015 - Invokana 100 mg daily  -added 05/2015 - Metformin ER 1000 >> 2000 mg with dinner  Pt checks her sugars 1-3 - am: 132-165, 177 (oreos) >> 124-152 >> 126-161 (icecream) - 2h after b'fast 114 >> 128 >> n/c - before lunch: 104-122 >> 109-146 >> 123 - 2h after lunch:  n/c >> 144 >> n/c >> 121 - before dinner:118-145, 158 >> 116-146 >> 119-130 - 2h after dinner: 146 >> 167, 183 >> n/c >> 125-147 - bedtime: 132, 215 >> 145-152, 204 (sick) >> 131-152 - nighttime: n/c It is unclear at which level she has hypoglycemia awareness Highest sugar was 167 >> 215 >> 200s when developed the rxn to Swerve >> 161  Glucometer: OneTouch  Drinks regular coke - 2x a week.  -No CKD, last BUN/creatinine:  Lab Results  Component Value Date   BUN 16 11/23/2017   Lab Results  Component Value Date   CREATININE 0.76 11/23/2017   -+ HL; last set of lipids: Lab Results  Component Value Date   CHOL 223 (H) 11/23/2017   HDL 51.40 11/23/2017   LDLCALC 136 (H) 11/23/2017   LDLDIRECT 124.0 08/15/2016   TRIG 177.0 (H) 11/23/2017   CHOLHDL 4 11/23/2017  Not on a statin. - last eye exam was in 12/2017: No DR -Denies numbness and tingling in her feet.  Vitamin D deficiency (9.8 on 02/15/2015) >> on ergocalciferol 50,000 units twice a week>> last level normal: Lab Results  Component Value Date   VD25OH 36.35 11/23/2017   VD25OH 60.31 08/15/2016   Denies bone or joint pains.  She has a  history of transaminitis.  Belviq was not covered for her.  ROS: Constitutional: no weight gain/no weight loss, no fatigue, + hot flashes, no subjective hypothermia Eyes: no blurry vision, no xerophthalmia ENT: no sore throat, no nodules palpated in throat, no dysphagia, no odynophagia, no hoarseness Cardiovascular: no CP/no SOB/no palpitations/no leg swelling Respiratory: + Cough/no SOB/+ wheezing -recovering from URI last week Gastrointestinal: no N/no V/no D/no C/no acid reflux Musculoskeletal: no muscle aches/no joint aches Skin: no rashes, no hair loss Neurological: no tremors/no numbness/no tingling/no dizziness  I reviewed pt's medications, allergies, PMH, social hx, family hx, and changes were documented in the history of present illness. Otherwise, unchanged from my initial visit note.  Past Medical History:  Diagnosis Date  . Diabetes mellitus without complication ([place])   . Elevated cholesterol with elevated triglycerides   . Psoriasis   . Sciatic pain 2015  . Vitamin D deficiency    Past Surgical History:  Procedure Laterality Date  . ABDOMINAL HYSTERECTOMY N/A 05/07/2015   Procedure: HYSTERECTOMY ABDOMINAL;  Surgeon: Brayton Mars, MD;  Location: [facility] ORS;  Service: Gynecology;  Laterality: N/A;  . ANKLE ARTHROSCOPY Right   . COLONOSCOPY WITH PROPOFOL N/A 04/03/2016   Procedure: COLONOSCOPY WITH PROPOFOL;  Surgeon: Manya Silvas, MD;  Location: [facility] ENDOSCOPY;  Service: Endoscopy;  Laterality: N/A;  .  ROBOTIC ASSISTED LAPAROSCOPIC VAGINAL HYSTERECTOMY WITH FIBROID REMOVAL     Fibroid removal  . TONSILLECTOMY AND ADENOIDECTOMY     History   Social History Main Topics  . Smoking status: Never Smoker   . Smokeless tobacco: Not on file  . Alcohol Use: 0.0 oz/week    0 Standard drinks or equivalent per week  . Drug Use: No  . 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 ACCU-CHEK FASTCLIX LANCETS MISC Use to check two times daily 200 each 5  . ACCU-CHEK GUIDE test strip USE AS INSTRUCTED TO CHECK SUGAR TWO TIMES DAILY 200 each 5  . Apremilast (OTEZLA) 30 MG TABS Take 1 tablet by mouth 2 (two) times daily. 60 tablet 5  . canagliflozin (INVOKANA) 100 MG TABS tablet Take 1 tablet (100 mg total) by mouth daily. 90 tablet 3  . Insulin Glargine (LANTUS SOLOSTAR) 100 UNIT/ML Solostar Pen Inject 25 Units into the skin at bedtime. 10 pen 3  . Insulin Pen Needle (BD PEN NEEDLE NANO U/F) 32G X 4 MM MISC USE DAILY 100 each 3  . metFORMIN (GLUCOPHAGE-XR) 500 MG 24 hr tablet TAKE 4 TABLETS BY MOUTH DAILY WITH SUPPER 360 tablet 3  . Multiple Vitamin (MULTI-VITAMINS) TABS Take by mouth.    . nystatin-triamcinolone (MYCOLOG II) cream Apply 1 application topically 2 (two) times daily. Use for 10-14 days 30 g 2  . PARoxetine Mesylate 7.5 MG CAPS Take 1 tablet by mouth daily. 30 capsule 11  . Vitamin D, Ergocalciferol, (DRISDOL) 50000 units CAPS capsule TAKE 1 CAPSULE BY MOUTH 2 TIMES A WEEK. 24 capsule 3  . Continuous Blood Gluc Receiver (FREESTYLE LIBRE READER) DEVI 1 Device by Does not apply route 3 (three) times daily. (Patient not taking: Reported on 07/26/2018) 1 Device 1  . Continuous Blood Gluc Sensor (FREESTYLE LIBRE SENSOR SYSTEM) MISC 1 Device by Does not apply route every 30 (thirty) days. (Patient not taking: Reported on 07/26/2018) 3 each 11  . Lorcaserin HCl (BELVIQ) 10 MG TABS Take 10 mg by mouth 2 (two) times daily. (Patient not taking: Reported on 07/26/2018) 60 tablet 3   No current facility-administered medications on file prior to visit.    Allergies  Allergen Reactions  . Clobetasol Rash    Ointment used for psorasis   Family History  Problem Relation Age of Onset  . Diabetes Mother   . Hypertension Mother    . Colon cancer Father   . Heart disease Father   . Hypertension Father   . Cancer Father   . Colon cancer Sister   . Cancer Sister   . Breast cancer Paternal Aunt        times 3   PE: BP 138/80   Pulse (!) 104   Ht 5\' 5"  (1.651 m) Comment: measured  Wt 253 lb (114.8 kg)   LMP 05/01/2013   SpO2 95%   BMI 42.10 kg/m  Body mass index is 42.1 kg/m. Wt Readings from Last 3 Encounters:  07/26/18 253 lb (114.8 kg)  06/15/18 254 lb 6.4 oz (115.4 kg)  04/12/18 254 lb 4.8 oz (115.3 kg)   Constitutional: overweight, in NAD Eyes: PERRLA, EOMI, no exophthalmos ENT: moist mucous membranes, no thyromegaly, no cervical  lymphadenopathy Cardiovascular: Tachycardia, RR, No MRG Respiratory: CTA B Gastrointestinal: abdomen soft, NT, ND, BS+ Musculoskeletal: no deformities, strength intact in all 4 Skin: moist, warm, no rashes Neurological: no tremor with outstretched hands, DTR normal in all 4  ASSESSMENT: 1. DM2, insulin-dependent, w/o long term complications but with hyperglycemia.  2. HL  PLAN:  1. Patient with history of controlled type 2 diabetes, with occasional hyperglycemic spikes due to dietary indiscretions.  She did have some high blood sugar since last visit usually related to ice cream.  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 -However, the majority of the sugars are at goal per review of her sugar log, so we will not change her regimen for now. - I suggested to:  Patient Instructions  Please continue: - Invokana 100 mg in a.m. - Metformin ER 2000 mg with dinner - Lantus 25 units at night.  Please come back for a follow-up appointment in 4 months.  - today, HbA1c is 6.8% (stable, at goal) - continue checking sugars at different times of the day - check 1x a day, rotating checks - advised for yearly eye exams >> she is UTD - Return to clinic in 3 mo with sugar log      2. HL - Reviewed latest lipid panel from 10/2017: LDL worse, total  cholesterol and triglycerides high Lab Results  Component Value Date   CHOL 223 (H) 11/23/2017   HDL 51.40 11/23/2017   LDLCALC 136 (H) 11/23/2017   LDLDIRECT 124.0 08/15/2016   TRIG 177.0 (H) 11/23/2017   CHOLHDL 4 11/23/2017  - refuses statins  3. Vit D def -Continues on ergocalciferol 50,000 units twice a week -Latest vitamin D level was reviewed and this was normal in 10/2017 -We will recheck this at next visit   Philemon Kingdom, MD PhD [facility] End[facility]

## 2018-07-26 NOTE — Patient Instructions (Addendum)
Please continue: - Invokana 100 mg in a.m. - Metformin ER 2000 mg with dinner - Lantus 25 units at night.  Please come back for a follow-up appointment in 4 months.  

## 2018-08-21 MED FILL — OTEZLA 30 MG TABS: 30 | 30 days supply | Qty: 60 | Fill #4

## 2018-09-06 ENCOUNTER — Other Ambulatory Visit: Payer: Self-pay | Admitting: Obstetrics and Gynecology

## 2018-09-06 DIAGNOSIS — K649 Unspecified hemorrhoids: Secondary | ICD-10-CM

## 2018-09-06 NOTE — Progress Notes (Signed)
Patient desires referral to General Surgery due to enlarging painful hemorrhoids. Has been performing Sitz baths, using Tucks pads and Preparation-H without relief. Referral placed.

## 2018-09-09 ENCOUNTER — Ambulatory Visit (INDEPENDENT_AMBULATORY_CARE_PROVIDER_SITE_OTHER): Payer: 59 | Admitting: General Surgery

## 2018-09-09 ENCOUNTER — Other Ambulatory Visit: Payer: Self-pay

## 2018-09-09 ENCOUNTER — Encounter: Payer: Self-pay | Admitting: General Surgery

## 2018-09-09 VITALS — BP 140/78 | HR 89 | Temp 96.9°F | Ht 65.0 in | Wt 252.0 lb

## 2018-09-09 DIAGNOSIS — K644 Residual hemorrhoidal skin tags: Secondary | ICD-10-CM | POA: Insufficient documentation

## 2018-09-09 NOTE — Patient Instructions (Signed)
Please call our office with any questions or concerns.    How to Take a Sitz Bath A sitz bath is a warm water bath that is taken while you are sitting down. The water should only come up to your hips and should cover your buttocks. Your health care provider may recommend a sitz bath to help you:  Clean the lower part of your body, including your genital area.  With itching.  With pain.  With sore muscles or muscles that tighten or spasm.  How to take a sitz bath Take 3-4 sitz baths per day or as told by your health care provider. 1. Partially fill a bathtub with warm water. You will only need the water to be deep enough to cover your hips and buttocks when you are sitting in it. 2. If your health care provider told you to put medicine in the water, follow the directions exactly. 3. Sit in the water and open the tub drain a little. 4. Turn on the warm water again to keep the tub at the correct level. Keep the water running constantly. 5. Soak in the water for 15-20 minutes or as told by your health care provider. 6. After the sitz bath, pat the affected area dry first. Do not rub it. 7. Be careful when you stand up after the sitz bath because you may feel dizzy.  Contact a health care provider if:  Your symptoms get worse. Do not continue with sitz baths if your symptoms get worse.  You have new symptoms. Do not continue with sitz baths until you talk with your health care provider. This information is not intended to replace advice given to you by your health care provider. Make sure you discuss any questions you have with your health care provider. Document Released: 07/05/2004 Document Revised: 03/12/2016 Document Reviewed: 10/11/2014 Elsevier Interactive Patient Education  2018 [employer]. Hemorrhoids Hemorrhoids are swollen veins in and around the rectum or anus. Hemorrhoids can cause pain, itching, or bleeding. Most of the time, they do not cause serious problems. They  usually get better with diet changes, lifestyle changes, and other home treatments. Follow these instructions at home: Eating and drinking  Eat foods that have fiber, such as whole grains, beans, nuts, fruits, and vegetables. Ask your doctor about taking products that have added fiber (fibersupplements).  Drink enough fluid to keep your pee (urine) clear or pale yellow. For Pain and Swelling  Take a warm-water bath (sitz bath) for 20 minutes to ease pain. Do this 3-4 times a day.  If directed, put ice on the painful area. It may be helpful to use ice between your warm baths. ? Put ice in a plastic bag. ? Place a towel between your skin and the bag. ? Leave the ice on for 20 minutes, 2-3 times a day. General instructions  Take over-the-counter and prescription medicines only as told by your doctor. ? Medicated creams and medicines that are inserted into the anus (suppositories) may be used or applied as told.  Exercise often.  Go to the bathroom when you have the urge to poop (to have a bowel movement). Do not wait.  Avoid pushing too hard (straining) when you poop.  Keep the butt area dry and clean. Use wet toilet paper or moist paper towels.  Do not sit on the toilet for a long time. Contact a doctor if:  You have any of these: ? Pain and swelling that do not get better with treatment or  medicine. ? Bleeding that will not stop. ? Trouble pooping or you cannot poop. ? Pain or swelling outside the area of the hemorrhoids. This information is not intended to replace advice given to you by your health care provider. Make sure you discuss any questions you have with your health care provider. Document Released: 07/22/2008 Document Revised: 03/20/2016 Document Reviewed: 06/27/2015 Elsevier Interactive Patient Education  [employer].

## 2018-09-09 NOTE — Progress Notes (Signed)
Patient ID: Cassandra Munoz, female   DOB: 07/15/1961, 57 y.o.   MRN: [id number]  Chief Complaint  Patient presents with  . New Patient (Initial Visit)    Hemorrhoids    HPI Cassandra Munoz is a 57 y.o. female.   She is here today for further evaluation of hemorrhoids. She reports that she had internal hemorrhoids found on her routine colonoscopy done in 2017. I reviewed this report and saw that they were grade 1 internal hemorrhoids.  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  She describes the mass as about the size of a marble. It was uncomfortable for her to sit and walk. She reports one episode of bright red blood on her toilet tissue. She takes [place] for psoriasis, which causes diarrhea and she feels like perhaps the hemorrhoid was brought on/exacerbated by an episode of diarrhea.  She started using Preparation H and Tuck's pads and has had good relief of her symptoms since then. She considered canceling this appointment, but also felt like she really never wanted to go through the symptoms she experienced again.   Past Medical History:  Diagnosis Date  . Diabetes mellitus without complication ([place])   . Elevated cholesterol with elevated triglycerides   . Psoriasis   . Sciatic pain 2015  . Vitamin D deficiency     Past Surgical History:  Procedure Laterality Date  . ABDOMINAL HYSTERECTOMY N/A 05/07/2015   Procedure: HYSTERECTOMY ABDOMINAL;  Surgeon: Brayton Mars, MD;  Location: [facility] ORS;  Service: Gynecology;  Laterality: N/A;  . ANKLE ARTHROSCOPY Right   . COLONOSCOPY WITH PROPOFOL N/A 04/03/2016   Procedure: COLONOSCOPY WITH PROPOFOL;  Surgeon: Manya Silvas, MD;  Location: [facility]COPY;  Service: Endoscopy;  Laterality: N/A;  . ROBOTIC ASSISTED LAPAROSCOPIC VAGINAL HYSTERECTOMY WITH FIBROID REMOVAL     Fibroid removal  .  TONSILLECTOMY AND ADENOIDECTOMY      Family History  Problem Relation Age of Onset  . Diabetes Mother   . Hypertension Mother   . Colon cancer Father   . Heart disease Father   . Hypertension Father   . Cancer Father   . Colon cancer Sister   . Cancer Sister   . Breast cancer Paternal Aunt        times 3    Social History Social History   Tobacco Use  . Smoking status: Never Smoker  . Smokeless tobacco: Never Used  Substance Use Topics  . Alcohol use: No    Alcohol/week: 0.0 standard drinks    Comment: maybe 1 drink/month - socially  . Drug use: No    Allergies  Allergen Reactions  . Clobetasol Rash    Ointment used for psorasis    Current Outpatient Medications  Medication Sig Dispense Refill  . ACCU-CHEK FASTCLIX LANCETS MISC Use to check two times daily 200 each 5  . ACCU-CHEK GUIDE test strip USE AS INSTRUCTED TO CHECK SUGAR TWO TIMES DAILY 200 each 5  . Apremilast (OTEZLA) 30 MG TABS Take 1 tablet by mouth 2 (two) times daily. 60 tablet 5  . canagliflozin (INVOKANA) 100 MG TABS tablet Take 1 tablet (100 mg total) by mouth daily. 90 tablet 3  . Continuous Blood Gluc Receiver (FREESTYLE LIBRE READER) DEVI 1 Device by Does not apply route 3 (three) times daily. 1 Device 1  . Continuous Blood Gluc  Sensor (FREESTYLE LIBRE SENSOR SYSTEM) MISC 1 Device by Does not apply route every 30 (thirty) days. 3 each 11  . Insulin Glargine (LANTUS SOLOSTAR) 100 UNIT/ML Solostar Pen Inject 25 Units into the skin at bedtime. 10 pen 3  . Insulin Pen Needle (BD PEN NEEDLE NANO U/F) 32G X 4 MM MISC USE DAILY 100 each 3  . metFORMIN (GLUCOPHAGE-XR) 500 MG 24 hr tablet TAKE 4 TABLETS BY MOUTH DAILY WITH SUPPER 360 tablet 3  . Multiple Vitamin (MULTI-VITAMINS) TABS Take by mouth.    . nystatin-triamcinolone (MYCOLOG II) cream Apply 1 application topically 2 (two) times daily. Use for 10-14 days 30 g 2  . PARoxetine Mesylate 7.5 MG CAPS Take 1 tablet by mouth daily. 30 capsule 11  .  Vitamin D, Ergocalciferol, (DRISDOL) 50000 units CAPS capsule TAKE 1 CAPSULE BY MOUTH 2 TIMES A WEEK. 24 capsule 3   No current facility-administered medications for this visit.     Review of Systems Review of Systems  All other systems reviewed and are negative.   Blood pressure 140/78, pulse 89, temperature (!) 96.9 F (36.1 C), temperature source Oral, height 5\' 5"  (1.651 m), weight 252 lb (114.3 kg), last menstrual period 05/01/2013.  Physical Exam Physical Exam  Constitutional: She appears well-developed and well-nourished. No distress.  HENT:  Head: Normocephalic and atraumatic.  Mouth/Throat: Oropharynx is clear and moist. No oropharyngeal exudate.  Eyes: Pupils are equal, round, and reactive to light. Conjunctivae are normal. No scleral icterus.  Neck: Normal range of motion. Neck supple. No tracheal deviation present. No thyromegaly present.  Cardiovascular: Normal rate, regular rhythm, normal heart sounds and intact distal pulses.  Pulmonary/Chest: Effort normal and breath sounds normal.  Abdominal: Soft. Bowel sounds are normal.  Protuberant, consistent with her level of obesity.  Genitourinary: Rectal exam shows external hemorrhoid. Rectal exam shows no fissure, no mass and no tenderness.     Genitourinary Comments: There is a small external hemorrhoid.  It is not inflamed nor thrombosed.  Rectal exam negative for mass, fluctuance, or gross blood.  Musculoskeletal: She exhibits no edema or deformity.  Lymphadenopathy:    She has no cervical adenopathy.  Skin: Skin is warm and dry.  Psychiatric: She has a normal mood and affect.  Vitals reviewed.   Data Reviewed Colonoscopy report for 2017 was reviewed with findings of grade 1 internal hemorrhoids  Assessment    57 y/o F with recent external hemorrhoid exacerbation, potentially related to diarrhea from medications. She treated it at home with good results.    Plan    We discussed hemorrhoid surgery today.   While banding or sclerotherapy are options for problematic internal hemorrhoids, excision is really the only surgical option for external hemorrhoids. It is an extremely painful operation.  In light of the good resolution of symptoms she had with conservative care, I do not suggest surgical hemorrhoidectomy at this time.  We discussed additional conservative treatments, including suppositories, sitz baths, avoiding constipation and diarrhea (as able), glycerin and Epsom salt packs, as well as the methods she employed with this episode.   No plans for follow up made, however she is welcome to return at any time.       Fredirick Maudlin 09/09/2018, 10:07 AM

## 2018-09-21 MED FILL — OTEZLA 30 MG TABS: 30 | 30 days supply | Qty: 60 | Fill #5

## 2018-10-22 MED FILL — OTEZLA 30 MG TABS: 30 | 30 days supply | Qty: 60 | Fill #1

## 2018-10-25 ENCOUNTER — Other Ambulatory Visit: Payer: Self-pay | Admitting: Internal Medicine

## 2018-11-18 ENCOUNTER — Encounter: Payer: Self-pay | Admitting: Internal Medicine

## 2018-11-18 ENCOUNTER — Ambulatory Visit (INDEPENDENT_AMBULATORY_CARE_PROVIDER_SITE_OTHER): Payer: 59 | Admitting: Internal Medicine

## 2018-11-18 VITALS — BP 138/70 | HR 105 | Ht 65.0 in | Wt 253.0 lb

## 2018-11-18 DIAGNOSIS — E559 Vitamin D deficiency, unspecified: Secondary | ICD-10-CM

## 2018-11-18 DIAGNOSIS — E1165 Type 2 diabetes mellitus with hyperglycemia: Secondary | ICD-10-CM | POA: Diagnosis not present

## 2018-11-18 DIAGNOSIS — Z794 Long term (current) use of insulin: Secondary | ICD-10-CM

## 2018-11-18 DIAGNOSIS — E785 Hyperlipidemia, unspecified: Secondary | ICD-10-CM | POA: Diagnosis not present

## 2018-11-18 LAB — HEMOGLOBIN A1C: Hemoglobin A1C: 6.6

## 2018-11-18 NOTE — Progress Notes (Addendum)
Patient ID: TA FAIR, female   DOB: 1961-04-12, 58 y.o.   MRN: [id number]  HPI: Cassandra Munoz is a 58 y.o.-year-old female, initially referred by [name]), for management of DM2, 2012, insulin-dependent, uncontrolled, without long term complications. Last visit 4 months ago.  She continues to have a lot of stress at work and will actually stepped down from her position due to this.  Reviewed HbA1c levels: Lab Results  Component Value Date   HGBA1C 6.8 (A) 07/26/2018   HGBA1C 6.8 (A) 03/23/2018   HGBA1C 6.9 11/23/2017  2012: HbA1c 7%  She is on: - Lantus 15 >> 20 >> 25 units-added 03/2015 - Invokana 100 mg daily  -added 05/2015 - Metformin ER 1000 >> 2000 mg with dinner  Pt checks her sugars 1-3 times a day: - am:  124-152 >> 126-161 (icecream) >>120-150, 155 - 2h after b'fast 114 >> 128 >> n/c - before lunch: 104-122 >> 109-146 >> 123 >> 125 - 2h after lunch: 144 >> n/c >> 121 >> 115, 175 - before dinner: 116-146 >> 119-130 >> 108-134, 144 (snack) - 2h after dinner:  167, 183 >> n/c >> 125-147 >> 143, 159, 188 - bedtime: 145-152, 204 (sick) >> 131-152 >> n/c - nighttime: n/c It is unclear at which level she has hypoglycemia awareness. Highest sugar was 161 >> 188  Glucometer: OneTouch  -No CKD, last BUN/creatinine:  Lab Results  Component Value Date   BUN 16 11/23/2017   Lab Results  Component Value Date   CREATININE 0.76 11/23/2017   -+ HL; last set of lipids: Lab Results  Component Value Date   CHOL 223 (H) 11/23/2017   HDL 51.40 11/23/2017   LDLCALC 136 (H) 11/23/2017   LDLDIRECT 124.0 08/15/2016   TRIG 177.0 (H) 11/23/2017   CHOLHDL 4 11/23/2017  Refused statins. - last eye exam was in 12/2017: No DR -No numbness and tingling in her feet.  Vitamin D deficiency (9.8 on 02/15/2015) >> currently on ergocalciferol 50,000 units twice a week >> latest level was normal: Lab Results  Component Value Date   VD25OH 36.35 11/23/2017   VD25OH 60.31  08/15/2016   No bone or joint pains.  She has a history of transaminitis. Also, psoriasis.  [place] was not covered for her.  ROS: Constitutional: no weight gain/no weight loss, no fatigue, no subjective hyperthermia, no subjective hypothermia Eyes: no blurry vision, no xerophthalmia ENT: no sore throat, no nodules palpated in neck, no dysphagia, no odynophagia, no hoarseness Cardiovascular: no CP/no SOB/no palpitations/no leg swelling Respiratory: no cough/no SOB/no wheezing Gastrointestinal: no N/no V/no D/no C/no acid reflux Musculoskeletal: no muscle aches/no joint aches Skin: no rashes, no hair loss Neurological: no tremors/no numbness/no tingling/no dizziness  I reviewed pt's medications, allergies, PMH, social hx, family hx, and changes were documented in the history of present illness. Otherwise, unchanged from my initial visit note.  Past Medical History:  Diagnosis Date  . Diabetes mellitus without complication ([place])   . Elevated cholesterol with elevated triglycerides   . Psoriasis   . Sciatic pain 2015  . Vitamin D deficiency    Past Surgical History:  Procedure Laterality Date  . ABDOMINAL HYSTERECTOMY N/A 05/07/2015   Procedure: HYSTERECTOMY ABDOMINAL;  Surgeon: Brayton Mars, MD;  Location: [facility] ORS;  Service: Gynecology;  Laterality: N/A;  . ANKLE ARTHROSCOPY Right   . COLONOSCOPY WITH PROPOFOL N/A 04/03/2016   Procedure: COLONOSCOPY WITH PROPOFOL;  Surgeon: Manya Silvas, MD;  Location: [facility] ENDOSCOPY;  Service: Endoscopy;  Laterality: N/A;  . ROBOTIC ASSISTED LAPAROSCOPIC VAGINAL HYSTERECTOMY WITH FIBROID REMOVAL     Fibroid removal  . TONSILLECTOMY AND ADENOIDECTOMY     History   Social History Main Topics  . Smoking status: Never Smoker   . Smokeless tobacco: Not on file  . Alcohol Use: 0.0 oz/week    0 Standard drinks or equivalent per week  . Drug Use: No  . 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 ACCU-CHEK FASTCLIX LANCETS MISC Use to check two times daily 200 each 5  . ACCU-CHEK GUIDE test strip USE AS INSTRUCTED TO CHECK SUGAR TWO TIMES DAILY 200 each 5  . Apremilast (OTEZLA) 30 MG TABS Take 1 tablet by mouth 2 (two) times daily. 60 tablet 5  . canagliflozin (INVOKANA) 100 MG TABS tablet Take 1 tablet (100 mg total) by mouth daily. 90 tablet 3  . Continuous Blood Gluc Receiver (FREESTYLE LIBRE READER) DEVI 1 Device by Does not apply route 3 (three) times daily. 1 Device 1  . Continuous Blood Gluc Sensor (FREESTYLE LIBRE SENSOR SYSTEM) MISC 1 Device by Does not apply route every 30 (thirty) days. 3 each 11  . Insulin Pen Needle (BD PEN NEEDLE NANO U/F) 32G X 4 MM MISC USE DAILY 100 each 3  . LANTUS SOLOSTAR 100 UNIT/ML Solostar Pen INJECT 25 UNITS INTO THE SKIN AT BEDTIME. 15 mL 3  . metFORMIN (GLUCOPHAGE-XR) 500 MG 24 hr tablet TAKE 4 TABLETS BY MOUTH DAILY WITH SUPPER 360 tablet 3  . Multiple Vitamin (MULTI-VITAMINS) TABS Take by mouth.    . nystatin-triamcinolone (MYCOLOG II) cream Apply 1 application topically 2 (two) times daily. Use for 10-14 days 30 g 2  . PARoxetine Mesylate 7.5 MG CAPS Take 1 tablet by mouth daily. 30 capsule 11  . Vitamin D, Ergocalciferol, (DRISDOL) 50000 units CAPS capsule TAKE 1 CAPSULE BY MOUTH 2 TIMES A WEEK. 24 capsule 3   No current facility-administered medications on file prior to visit.    Allergies  Allergen Reactions  . Clobetasol Rash    Ointment used for psorasis   Family History  Problem Relation Age of Onset  . Diabetes Mother   . Hypertension Mother   . Colon cancer Father   . Heart disease Father   . Hypertension Father   . Cancer Father   . Colon cancer Sister   . Cancer Sister   . Breast cancer Paternal Aunt        times  3   PE: BP 138/70   Pulse (!) 105   Ht 5\' 5"  (1.651 m)   Wt 253 lb (114.8 kg)   LMP 05/01/2013   SpO2 96%   BMI 42.10 kg/m  Body mass index is 42.1 kg/m. Wt Readings from Last 3 Encounters:  11/18/18 253 lb (114.8 kg)  09/09/18 252 lb (114.3 kg)  07/26/18 253 lb (114.8 kg)   Constitutional: overweight, in NAD Eyes: PERRLA, EOMI, no exophthalmos ENT: moist mucous membranes, no thyromegaly, no cervical lymphadenopathy Cardiovascular: tachycardia, RR, No MRG Respiratory: CTA B Gastrointestinal: abdomen soft, NT, ND, BS+ Musculoskeletal: no deformities, strength intact in all 4 Skin: moist, warm, no rashes Neurological: no tremor with outstretched hands, DTR normal in all  4  ASSESSMENT: 1. DM2, insulin-dependent, w/o long term complications but with hyperglycemia.  2. HL  3.  Vitamin D deficiency  PLAN:  1. Patient with history of controlled type 2 diabetes, with occasional hyperglycemic spikes due to dietary indiscretions.  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  She was also eating more ice cream.  However, the majority of her sugars were at goal per review of her sugar log so we did not change the regimen at that time. -At this visit, sugars are mostly at goal, with an occasional spike due to dietary indiscretions.  Overall, her control is good, however, with only slightly higher blood sugars in the morning, consistent with non-phenomenon.  For now, we can continue the current regimen.  She would like to start going to the gym after she has more free time and I think that would help significantly. -She is due for labs but she just ate so we will wait to do the labs fasting - I suggested to:  Patient Instructions  Please continue: - Invokana 100 mg in a.m. - Metformin ER 2000 mg with dinner - Lantus 25 units at night.  Please come back fasting for labs.  Please come back for a follow-up  appointment in 4 months.  - today, HbA1c is 6.6% (better) - continue checking sugars at different times of the day - check 1x a day, rotating checks - advised for yearly eye exams >> she is UTD - Return to clinic in 3 mo with sugar log       2. HL - Reviewed latest lipid panel: LDL worse, triglycerides high Lab Results  Component Value Date   CHOL 223 (H) 11/23/2017   HDL 51.40 11/23/2017   LDLCALC 136 (H) 11/23/2017   LDLDIRECT 124.0 08/15/2016   TRIG 177.0 (H) 11/23/2017   CHOLHDL 4 11/23/2017  -She refused statins. -She is due for another check  3. Vit D def -Continues on ergocalciferol 50,000 units twice a week -Vitamin D level reviewed from 10/2017: Normal -We will recheck level at next lab draw  Component     Latest Ref Rng & Units 11/22/2018  Sodium     135 - 145 mmol/L 137  Potassium     3.5 - 5.1 mmol/L 4.0  Chloride     98 - 111 mmol/L 105  CO2     22 - 32 mmol/L 23  Glucose     70 - 99 mg/dL 133 (H)  BUN     6 - 20 mg/dL 14  Creatinine     0.44 - 1.00 mg/dL 0.62  Calcium     8.9 - 10.3 mg/dL 9.0  Total Protein     6.5 - 8.1 g/dL 7.7  Albumin     3.5 - 5.0 g/dL 4.1  AST     15 - 41 U/L 43 (H)  ALT     0 - 44 U/L 74 (H)  Alkaline Phosphatase     38 - 126 U/L 45  Total Bilirubin     0.3 - 1.2 mg/dL 0.5  GFR, Est Non African American     >60 mL/min >60  GFR, Est African American     >60 mL/min >60  Anion gap     5 - 15 9  Cholesterol     0 - 200 mg/dL 217 (H)  Triglycerides     <150 mg/dL 147  HDL Cholesterol     >  40 mg/dL 53  Total CHOL/HDL Ratio     RATIO 4.1  VLDL     0 - 40 mg/dL 29  LDL (calc)     0 - 99 mg/dL 135 (H)  Vitamin D, 25-Hydroxy     30.0 - 100.0 ng/mL 17.2 (L)   Lipids are approximately the same as before, with an elevated LDL.  LFTs have improved.  Glucose slightly high.  Vitamin D very low.  We will suggest to reconsider statins.  We can try pravastatin.  I will also try to clarify why her vitamin D is so low.  Any  missed doses?  Her previous level was normal on the same ergocalciferol dose.   Philemon Kingdom, MD PhD [facility]

## 2018-11-18 NOTE — Patient Instructions (Addendum)
Please continue: - Invokana 100 mg in a.m. - Metformin ER 2000 mg with dinner - Lantus 25 units at night.  Please come back fasting for labs.  Please come back for a follow-up appointment in 4 months.

## 2018-11-22 ENCOUNTER — Other Ambulatory Visit
Admission: RE | Admit: 2018-11-22 | Discharge: 2018-11-22 | Disposition: A | Discharge status: Home / Self Care | Payer: 59 | Source: Ambulatory Visit | Attending: Internal Medicine | Admitting: Internal Medicine

## 2018-11-22 DIAGNOSIS — E559 Vitamin D deficiency, unspecified: Secondary | ICD-10-CM | POA: Diagnosis not present

## 2018-11-22 DIAGNOSIS — Z794 Long term (current) use of insulin: Secondary | ICD-10-CM

## 2018-11-22 DIAGNOSIS — E1165 Type 2 diabetes mellitus with hyperglycemia: Secondary | ICD-10-CM

## 2018-11-22 DIAGNOSIS — E785 Hyperlipidemia, unspecified: Secondary | ICD-10-CM | POA: Insufficient documentation

## 2018-11-22 LAB — COMPREHENSIVE METABOLIC PANEL
ALK PHOS: 45 U/L (ref 38–126)
ALT: 74 U/L — ABNORMAL HIGH (ref 0–44)
AST: 43 U/L — ABNORMAL HIGH (ref 15–41)
Albumin: 4.1 g/dL (ref 3.5–5.0)
Anion gap: 9 (ref 5–15)
BILIRUBIN TOTAL: 0.5 mg/dL (ref 0.3–1.2)
BUN: 14 mg/dL (ref 6–20)
CALCIUM: 9 mg/dL (ref 8.9–10.3)
CO2: 23 mmol/L (ref 22–32)
Chloride: 105 mmol/L (ref 98–111)
Creatinine, Ser: 0.62 mg/dL (ref 0.44–1.00)
GFR calc Af Amer: >60 mL/min (ref >60)
GFR calc non Af Amer: >60 mL/min (ref >60)
Glucose, Bld: 133 mg/dL — ABNORMAL HIGH (ref 70–99)
Potassium: 4 mmol/L (ref 3.5–5.1)
Sodium: 137 mmol/L (ref 135–145)
Total Protein: 7.7 g/dL (ref 6.5–8.1)

## 2018-11-22 LAB — LIPID PANEL
CHOLESTEROL: 217 mg/dL — AB (ref 0–200)
HDL: 53 mg/dL (ref >40)
LDL Cholesterol: 135 mg/dL — ABNORMAL HIGH (ref 0–99)
Total CHOL/HDL Ratio: 4.1 RATIO
Triglycerides: 147 mg/dL (ref <150)
VLDL: 29 mg/dL (ref 0–40)

## 2018-11-22 NOTE — Addendum Note (Signed)
Addended by: Santiago Bur on: 11/22/2018 09:08 AM   Modules accepted: Orders

## 2018-11-23 LAB — VITAMIN D 25 HYDROXY (VIT D DEFICIENCY, FRACTURES): Vit D, 25-Hydroxy: 17.2 ng/mL — ABNORMAL LOW (ref 30.0–100.0)

## 2018-12-01 MED FILL — OTEZLA 30 MG TABS: 30 | 30 days supply | Qty: 60 | Fill #2

## 2018-12-08 DIAGNOSIS — L501 Idiopathic urticaria: Secondary | ICD-10-CM | POA: Diagnosis not present

## 2018-12-08 DIAGNOSIS — L400 Psoriasis vulgaris: Secondary | ICD-10-CM | POA: Diagnosis not present

## 2018-12-08 DIAGNOSIS — L509 Urticaria, unspecified: Secondary | ICD-10-CM | POA: Diagnosis not present

## 2018-12-08 DIAGNOSIS — R21 Rash and other nonspecific skin eruption: Secondary | ICD-10-CM | POA: Diagnosis not present

## 2018-12-28 ENCOUNTER — Other Ambulatory Visit: Payer: Self-pay | Admitting: Internal Medicine

## 2019-01-03 MED FILL — OTEZLA 30 MG TABS: 30 | 30 days supply | Qty: 60 | Fill #3

## 2019-01-10 ENCOUNTER — Other Ambulatory Visit: Payer: Self-pay | Admitting: Internal Medicine

## 2019-01-14 ENCOUNTER — Other Ambulatory Visit: Payer: Self-pay | Admitting: Internal Medicine

## 2019-02-03 ENCOUNTER — Other Ambulatory Visit: Payer: Self-pay | Admitting: Internal Medicine

## 2019-02-03 ENCOUNTER — Other Ambulatory Visit: Payer: Self-pay | Admitting: Pharmacist

## 2019-02-03 MED ORDER — APREMILAST 30 MG PO TABS: 1.0000 | ORAL_TABLET | Freq: Two times a day (BID) | ORAL | 5 refills | Status: DC

## 2019-02-03 MED FILL — LANTUS SOLOSTAR 100 UNITS/M: 100 | 30 days supply | Qty: 15 | Fill #0

## 2019-02-03 MED FILL — PARoxetine HCL 10 MG TABS: 10 | 30 days supply | Qty: 30 | Fill #0

## 2019-02-03 MED FILL — OTEZLA 30 MG TABS: 30 | 30 days supply | Qty: 60 | Fill #0

## 2019-02-03 MED FILL — INVOKANA 100 MG TABLET: 100 | 90 days supply | Qty: 90 | Fill #0

## 2019-02-11 MED FILL — VIT D2 1.25 MG (50,000 UNIT: 1.25 MG | 84 days supply | Qty: 24 | Fill #0

## 2019-02-23 DIAGNOSIS — L400 Psoriasis vulgaris: Secondary | ICD-10-CM | POA: Diagnosis not present

## 2019-03-07 MED FILL — OTEZLA 30 MG TABS: 30 | 30 days supply | Qty: 60 | Fill #1

## 2019-03-13 MED FILL — PARoxetine HCL 10 MG TABS: 10 | 30 days supply | Qty: 30 | Fill #1

## 2019-03-24 ENCOUNTER — Other Ambulatory Visit: Payer: Self-pay

## 2019-03-24 ENCOUNTER — Encounter: Payer: Self-pay | Admitting: Internal Medicine

## 2019-03-24 ENCOUNTER — Ambulatory Visit (INDEPENDENT_AMBULATORY_CARE_PROVIDER_SITE_OTHER): Payer: 59 | Admitting: Internal Medicine

## 2019-03-24 DIAGNOSIS — E1165 Type 2 diabetes mellitus with hyperglycemia: Secondary | ICD-10-CM

## 2019-03-24 DIAGNOSIS — E785 Hyperlipidemia, unspecified: Secondary | ICD-10-CM

## 2019-03-24 DIAGNOSIS — E559 Vitamin D deficiency, unspecified: Secondary | ICD-10-CM | POA: Diagnosis not present

## 2019-03-24 DIAGNOSIS — Z794 Long term (current) use of insulin: Secondary | ICD-10-CM | POA: Diagnosis not present

## 2019-03-24 MED ORDER — INSULIN GLARGINE 100 UNIT/ML SOLOSTAR PEN: PEN_INJECTOR | SUBCUTANEOUS | 3 refills | Status: DC

## 2019-03-24 NOTE — Progress Notes (Signed)
Patient ID: Cassandra Munoz, female   DOB: Dec 26, 1960, 58 y.o.   MRN: [id number]  Patient location: Home My location: Office  I connected with the patient on 03/24/19 at  3:01 PM EDT by a video enabled telemedicine application and verified that I am speaking with the correct person.   I discussed the limitations of evaluation and management by telemedicine and the availability of in person appointments. The patient expressed understanding and agreed to proceed.   Details of the encounter are shown below.  HPI: Cassandra Munoz is a 58 y.o.-year-old female, initially referred by [name] [facility]), for management of DM2, 2012, insulin-dependent, uncontrolled, without long term complications. Last visit 4 months ago.  Reviewed HbA1c levels: Lab Results  Component Value Date   HGBA1C 6.8 (A) 07/26/2018   HGBA1C 6.8 (A) 03/23/2018   HGBA1C 6.9 11/23/2017  2012: HbA1c 7%  She is on: - Lantus 15 >> 20 >> 25 units-added 03/2015 - Invokana 100 mg daily  -added 05/2015 - Metformin ER 1000 >> 2000 mg with dinner  Pt checks her sugars 1-3 times a day: - am: 120-150, 155 >> 126-(ave 130-140)-160, 169 - 2h after b'fast 114 >> 128 >> n/c - before lunch: 109-146 >> 123 >> 125 >> 110-118 - 2h after lunch: 144 >> n/c >> 121 >> 115, 175 >> n/c - before dinner: 108-134, 144 >> 109-135, 143, 155 (snack) - 2h after dinner:  143, 159, 188 >> 149, 160, 191 - bedtime: 145-152, 204 (sick) >> 131-152 >> n/c - nighttime: n/c It is unclear at which level she has hypoglycemia awareness. Highest sugar was 161 >> 188 >> 191.  Glucometer: OneTouch  -No CKD, last BUN/creatinine:  Lab Results  Component Value Date   BUN 14 11/22/2018   Lab Results  Component Value Date   CREATININE 0.62 11/22/2018   -+ HL; last set of lipids: Lab Results  Component Value Date   CHOL 217 (H) 11/22/2018   HDL 53 11/22/2018   LDLCALC 135 (H) 11/22/2018   LDLDIRECT 124.0 08/15/2016   TRIG 147 11/22/2018   CHOLHDL  4.1 11/22/2018  She refused statins  - refused pravastatin suggested at last visit. - last eye exam was in 12/2017: No DR - Now numbness and tingling in her feet.  Vitamin D deficiency (9.8 on 02/15/2015) >> currently on ergocalciferol 50,000 units twice a week >> initially level normalized, but at last check this was very low.  We discussed about starting to take the medication consistently >> restarted to take it consistently. Lab Results  Component Value Date   VD25OH 17.2 (L) 11/22/2018   VD25OH 36.35 11/23/2017   VD25OH 60.31 08/15/2016   No bone/joint pain.  She has a history of transaminitis.  She also has a history of psoriasis.  Belviq was not covered for her.  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  ROS: Constitutional: no weight gain/no weight loss, no fatigue, no subjective hyperthermia, no subjective hypothermia Eyes: no blurry vision, no xerophthalmia ENT: no sore throat, no nodules palpated in neck, no dysphagia, no odynophagia, no hoarseness Cardiovascular: no CP/no SOB/no palpitations/no leg swelling Respiratory: no cough/no SOB/no wheezing Gastrointestinal: no N/no V/no D/no C/no acid reflux Musculoskeletal: no muscle aches/no joint aches Skin: no rashes, no hair loss Neurological: no tremors/no numbness/no tingling/no dizziness  I reviewed pt's medications, allergies, PMH, social hx, family hx, and changes were documented in the history of present illness. Otherwise, unchanged from my initial visit note.  Past Medical History:  Diagnosis Date  . Diabetes mellitus without complication ([place])   . Elevated cholesterol with elevated triglycerides   . Psoriasis   . Sciatic pain 2015  . Vitamin D deficiency    Past Surgical History:  Procedure Laterality Date  . ABDOMINAL HYSTERECTOMY N/A 05/07/2015   Procedure: HYSTERECTOMY ABDOMINAL;  Surgeon: Brayton Mars, MD;  Location: [facility] ORS;  Service: Gynecology;  Laterality: N/A;  . ANKLE ARTHROSCOPY Right    . COLONOSCOPY WITH PROPOFOL N/A 04/03/2016   Procedure: COLONOSCOPY WITH PROPOFOL;  Surgeon: Manya Silvas, MD;  Location[facility]COPY;  Service: Endoscopy;  Laterality: N/A;  . ROBOTIC ASSISTED LAPAROSCOPIC VAGINAL HYSTERECTOMY WITH FIBROID REMOVAL     Fibroid removal  . TONSILLECTOMY AND ADENOIDECTOMY     History   Social History Main Topics  . Smoking status: Never Smoker   . Smokeless tobacco: Not on file  . Alcohol Use: 0.0 oz/week    0 Standard drinks or equivalent per week  . Drug Use: No  . 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 ACCU-CHEK FASTCLIX LANCETS MISC Use to check two times daily 200 each 5  . ACCU-CHEK GUIDE test strip USE AS INSTRUCTED TO CHECK SUGAR TWO TIMES DAILY 200 each 5  . Apremilast (OTEZLA) 30 MG TABS Take 1 tablet by mouth 2 (two) times daily. 60 tablet 5  . INVOKANA 100 MG TABS tablet TAKE 1 TABLET (100 MG TOTAL) BY MOUTH DAILY. 90 tablet 3  . LANTUS SOLOSTAR 100 UNIT/ML Solostar Pen INJECT 25 UNITS INTO THE SKIN AT BEDTIME. 15 mL 3  . metFORMIN (GLUCOPHAGE-XR) 500 MG 24 hr tablet TAKE 4 TABLETS BY MOUTH DAILY WITH SUPPER 360 tablet 3  . Multiple Vitamin (MULTI-VITAMINS) TABS Take by mouth.    . nystatin-triamcinolone (MYCOLOG II) cream Apply 1 application topically 2 (two) times daily. Use for 10-14 days 30 g 2  . PARoxetine Mesylate 7.5 MG CAPS Take 1 tablet by mouth daily. 30 capsule 11  . UNIFINE PENTIPS 32G X 4 MM MISC USE DAILY 100 each 3  . Vitamin D, Ergocalciferol, (DRISDOL) 1.25 MG (50000 UT) CAPS capsule TAKE 1 CAPSULE BY MOUTH 2 TIMES A WEEK. 24 capsule 3   No current facility-administered medications on file prior to visit.    Allergies  Allergen Reactions  . Clobetasol Rash    Ointment used  for psorasis   Family History  Problem Relation Age of Onset  . Diabetes Mother   . Hypertension Mother   . Colon cancer Father   . Heart disease Father   . Hypertension Father   . Cancer Father   . Colon cancer Sister   . Cancer Sister   . Breast cancer Paternal Aunt        times 3   PE: LMP 05/01/2013  There is no height or weight on file to calculate BMI. Wt Readings from Last 3 Encounters:  11/18/18 253 lb (114.8 kg)  09/09/18 252 lb (114.3 kg)  07/26/18 253 lb (114.8 kg)   Constitutional: overweight, in NAD Eyes: PERRLA, EOMI, no exophthalmos ENT: moist mucous membranes, no thyromegaly, no cervical lymphadenopathy Cardiovascular: RRR, No MRG Respiratory: CTA B Gastrointestinal: abdomen soft, NT, ND, BS+ Musculoskeletal: no deformities, strength intact in  all 4 Skin: moist, warm, no rashes Neurological: no tremor with outstretched hands, DTR normal in all 4  ASSESSMENT: 1. DM2, insulin-dependent, w/o long term complications but with hyperglycemia.  2. HL  3.  Vitamin D deficiency  PLAN:  1. Patient with history of controlled type 2 diabetes, with occasional hyperglycemic spikes due to dietary indiscretions.  At last visit, sugars were mostly at goal, with only high blood sugars in the morning, consistent with dawn phenomenon.  We continued the same regimen at that time. -At this visit, sugars appear similar to before, which may be few more higher blood sugars in the morning, but with the majority of them being in the 130-140 range, again, consistent with dawn phenomenon. -We will continue with the current regimen for now, but discussed about reducing snacking at night and also the possibility of increasing the dose of Lantus by 2 units if her sugars continue to stay higher than 140.  - I suggested to:  Patient Instructions  Please continue: - Invokana 100 mg in a.m. - Metformin ER 2000 mg with dinner - Lantus 25 units at night.  Please come back for a follow-up  appointment in 4 months.  -We will recheck her HbA1c when she returns to the clinic - continue checking sugars at different times of the day - check 1x a day, rotating checks - advised for yearly eye exams >> she is due - Return to clinic in 4 mo with sugar log   2. HL -Reviewed lipid panel from 10/2018: LDL high Lab Results  Component Value Date   CHOL 217 (H) 11/22/2018   HDL 53 11/22/2018   LDLCALC 135 (H) 11/22/2018   LDLDIRECT 124.0 08/15/2016   TRIG 147 11/22/2018   CHOLHDL 4.1 11/22/2018  -She refused statins in the past - at last visit I again suggested pravastatin but she did not decide to start this.  3. Vit D def -Vitamin D level reviewed from last visit was very low -Continues on ergocalciferol 50,000 units twice a week >> now taken more consistently -We will recheck level at next visit  Philemon Kingdom, MD PhD [facility] Endocrinology

## 2019-03-24 NOTE — Patient Instructions (Signed)
Please continue: - Invokana 100 mg in a.m. - Metformin ER 2000 mg with dinner - Lantus 25 units at night.  Please come back for a follow-up appointment in 4 months.

## 2019-04-05 MED FILL — ACCU-CHEK GUIDE TEST STRIP: 90 days supply | Qty: 200 | Fill #0

## 2019-04-05 MED FILL — OTEZLA 30 MG TABS: 30 | 30 days supply | Qty: 60 | Fill #2

## 2019-04-15 ENCOUNTER — Other Ambulatory Visit: Payer: Self-pay | Admitting: Obstetrics and Gynecology

## 2019-04-15 MED FILL — METFORMIN HCL ER 500 MG TB2: 500 | 30 days supply | Qty: 120 | Fill #0

## 2019-04-15 MED FILL — LANTUS SOLOSTAR 100 UNITS/M: 100 | 60 days supply | Qty: 15 | Fill #0

## 2019-04-15 NOTE — Telephone Encounter (Signed)
Please advise. Thanks [employer] 

## 2019-04-16 MED FILL — PARoxetine HCL 10 MG TABS: 10 | 30 days supply | Qty: 30 | Fill #0

## 2019-04-18 NOTE — Telephone Encounter (Signed)
This can be approved. I think I already did it but it came back to my box again without the option to clear it.

## 2019-05-02 MED FILL — INVOKANA 100 MG TABLET: 100 | 90 days supply | Qty: 90 | Fill #1

## 2019-05-09 MED FILL — OTEZLA 30 MG TABS: 30 | 30 days supply | Qty: 60 | Fill #3

## 2019-05-11 MED FILL — UNIFINE PENTIPS 32GX5/32: 32G X 4 MM | 90 days supply | Qty: 100 | Fill #0

## 2019-05-11 MED FILL — UNIFINE PENTIPS 32GX5/32": 32G X 4 MM | 90 days supply | Qty: 100 | Fill #0

## 2019-05-13 MED FILL — METFORMIN HCL ER 500 MG TB2: 500 | 30 days supply | Qty: 120 | Fill #1

## 2019-05-13 MED FILL — VIT D2 1.25 MG (50,000 UNIT: 1.25 MG | 84 days supply | Qty: 24 | Fill #1

## 2019-05-13 MED FILL — PARoxetine HCL 10 MG TABS: 10 | 30 days supply | Qty: 30 | Fill #1

## 2019-06-14 MED FILL — metFORMIN HCL ER 500 MG TB2: 500 | 30 days supply | Qty: 120 | Fill #2

## 2019-06-15 DIAGNOSIS — E559 Vitamin D deficiency, unspecified: Secondary | ICD-10-CM | POA: Diagnosis not present

## 2019-06-15 DIAGNOSIS — E119 Type 2 diabetes mellitus without complications: Secondary | ICD-10-CM | POA: Diagnosis not present

## 2019-06-15 DIAGNOSIS — E1169 Type 2 diabetes mellitus with other specified complication: Secondary | ICD-10-CM | POA: Diagnosis not present

## 2019-06-15 DIAGNOSIS — L404 Guttate psoriasis: Secondary | ICD-10-CM | POA: Diagnosis not present

## 2019-06-15 DIAGNOSIS — L509 Urticaria, unspecified: Secondary | ICD-10-CM | POA: Diagnosis not present

## 2019-06-15 DIAGNOSIS — E063 Autoimmune thyroiditis: Secondary | ICD-10-CM | POA: Diagnosis not present

## 2019-06-15 DIAGNOSIS — R0982 Postnasal drip: Secondary | ICD-10-CM | POA: Diagnosis not present

## 2019-06-16 MED FILL — PARoxetine HCL 10 MG TABS: 10 | 30 days supply | Qty: 30 | Fill #2

## 2019-06-28 MED FILL — LANTUS SOLOSTAR 100 UNITS/M: 100 | 60 days supply | Qty: 15 | Fill #1

## 2019-07-07 ENCOUNTER — Encounter: Payer: Self-pay | Admitting: Internal Medicine

## 2019-07-07 DIAGNOSIS — H524 Presbyopia: Secondary | ICD-10-CM | POA: Diagnosis not present

## 2019-07-07 DIAGNOSIS — H5213 Myopia, bilateral: Secondary | ICD-10-CM | POA: Diagnosis not present

## 2019-07-07 LAB — HM DIABETES EYE EXAM

## 2019-07-07 MED FILL — OTEZLA 30 MG TABS: 30 | 30 days supply | Qty: 60 | Fill #4

## 2019-07-12 MED FILL — metFORMIN HCL ER 500 MG TB2: 500 | 30 days supply | Qty: 120 | Fill #3

## 2019-07-12 MED FILL — PARoxetine HCL 10 MG TABS: 10 | 30 days supply | Qty: 30 | Fill #3

## 2019-07-26 ENCOUNTER — Other Ambulatory Visit: Payer: Self-pay

## 2019-07-26 ENCOUNTER — Encounter: Payer: Self-pay | Admitting: Internal Medicine

## 2019-07-26 ENCOUNTER — Ambulatory Visit (INDEPENDENT_AMBULATORY_CARE_PROVIDER_SITE_OTHER): Payer: 59 | Admitting: Internal Medicine

## 2019-07-26 VITALS — BP 128/78 | HR 101 | Ht 65.0 in | Wt 255.0 lb

## 2019-07-26 DIAGNOSIS — E1165 Type 2 diabetes mellitus with hyperglycemia: Secondary | ICD-10-CM

## 2019-07-26 DIAGNOSIS — Z23 Encounter for immunization: Secondary | ICD-10-CM

## 2019-07-26 DIAGNOSIS — E559 Vitamin D deficiency, unspecified: Secondary | ICD-10-CM | POA: Diagnosis not present

## 2019-07-26 DIAGNOSIS — E785 Hyperlipidemia, unspecified: Secondary | ICD-10-CM | POA: Diagnosis not present

## 2019-07-26 DIAGNOSIS — Z794 Long term (current) use of insulin: Secondary | ICD-10-CM

## 2019-07-26 LAB — POCT GLYCOSYLATED HEMOGLOBIN (HGB A1C): Hemoglobin A1C: 6.6 % — AB (ref 4.0–5.6)

## 2019-07-26 MED ORDER — OZEMPIC (0.25 OR 0.5 MG/DOSE) 2 MG/1.5ML ~~LOC~~ SOPN: 0.5000 mg | PEN_INJECTOR | SUBCUTANEOUS | 5 refills | Status: DC

## 2019-07-26 NOTE — Patient Instructions (Signed)
Please continue: - Invokana 100 mg in a.m. - Metformin ER 2000 mg with dinner - Lantus 25 units at night.  Please start Ozempic 0.25 mg weekly in a.m. (for example on Sunday morning) x 4 weeks, then increase to 0.5 mg weekly in a.m. if no nausea or hypoglycemia.  Please come back for a follow-up appointment in 4 months.

## 2019-07-26 NOTE — Progress Notes (Signed)
Patient ID: Cassandra Munoz, female   DOB: 06-27-1961, 58 y.o.   MRN: [id number]  HPI: Cassandra Munoz is a 58 y.o.-year-old female, initially referred by [name] [facility]), for management of DM2, 2012, insulin-dependent, uncontrolled, without long term complications. Last visit 4 months ago (virtual).  She recently saw [facility] >> Food allergy test >> she multiple allergies, including to gluten, dairy, nuts, soy, etc. she did eliminate dairy in the last 2 weeks and she does feel better, less tired.  Reviewed HbA1c levels: Lab Results  Component Value Date   HGBA1C 6.6 11/18/2018   HGBA1C 6.8 (A) 07/26/2018   HGBA1C 6.8 (A) 03/23/2018  2012: HbA1c 7%  She is on: - Lantus-added 03/2015: 15 >> 20 >> 25 units - Invokana-added 05/2015: 100 mg daily   - Metformin ER 1000 >> 2000 mg with dinner  Pt checks her sugars 1-3 times a day: - am: 120-150, 155 >> 126-(ave 130-140)-160, 169 >> 120-160 - 2h after b'fast 114 >> 128 >> n/c - before lunch: 123 >> 125 >> 110-118 >> 119-134 - 2h after lunch:  121 >> 115, 175 >> n/c >> 125-165 - before dinner: 109-135, 143, 155 (snack) >> 104-149 - 2h after dinner:  143, 159, 188 >> 149, 160, 191 >> 170-204 - bedtime: 145-152, 204 >> 131-152 >> n/c >> 149-155 - nighttime: n/c It is unclear at which level she has hypoglycemia awareness. Highest sugar was 161 >> 188 >> 191 >> 204.  Glucometer: OneTouch  -No CKD, last BUN/creatinine:  Lab Results  Component Value Date   BUN 14 11/22/2018   Lab Results  Component Value Date   CREATININE 0.62 11/22/2018   -+ HL; last set of lipids: Lab Results  Component Value Date   CHOL 217 (H) 11/22/2018   HDL 53 11/22/2018   LDLCALC 135 (H) 11/22/2018   LDLDIRECT 124.0 08/15/2016   TRIG 147 11/22/2018   CHOLHDL 4.1 11/22/2018  She refused statins. - last eye exam was in 12/2017: No DR - no numbness and tingling in her feet.  Vitamin D deficiency (9.8 on 02/15/2015) >> currently on  ergocalciferol 50,000 units twice a week >> initially level normalized, but at last check, this was again low.  I advised her to take her supplement more consistently.  Recent vitamin D level obtained at radiotherapist was normal -we will scan the records. 06/15/2019: vit D 51.5 Lab Results  Component Value Date   VD25OH 17.2 (L) 11/22/2018   VD25OH 36.35 11/23/2017   VD25OH 60.31 08/15/2016   Denies bone or joint pain.  She has a history of transaminitis.   Last TSH nl:  06/15/2019: TSH 2.2  Also, history of psoriasis.  Belviq was not covered by her insurance.  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  ROS: Constitutional: no weight gain/no weight loss, no fatigue, no subjective hyperthermia, no subjective hypothermia Eyes: no blurry vision, no xerophthalmia ENT: no sore throat, no nodules palpated in neck, no dysphagia, no odynophagia, no hoarseness Cardiovascular: no CP/no SOB/no palpitations/no leg swelling Respiratory: no cough/no SOB/no wheezing Gastrointestinal: no N/no V/no D/no C/no acid reflux Musculoskeletal: no muscle aches/no joint aches Skin: no rashes, no hair loss Neurological: no tremors/no numbness/no tingling/no dizziness  I reviewed pt's medications, allergies, PMH, social hx, family hx, and changes were documented in the history of present illness. Otherwise, unchanged from my initial visit note.  Past Medical History:  Diagnosis Date  . Diabetes mellitus without complication ([place])   . Elevated  cholesterol with elevated triglycerides   . Psoriasis   . Sciatic pain 2015  . Vitamin D deficiency    Past Surgical History:  Procedure Laterality Date  . ABDOMINAL HYSTERECTOMY N/A 05/07/2015   Procedure: HYSTERECTOMY ABDOMINAL;  Surgeon: Brayton Mars, MD;  Location: [facility] ORS;  Service: Gynecology;  Laterality: N/A;  . ANKLE ARTHROSCOPY Right   . COLONOSCOPY WITH PROPOFOL N/A 04/03/2016   Procedure: COLONOSCOPY WITH PROPOFOL;  Surgeon: Manya Silvas, MD;  Location: [facility] ENDOSCOPY;  Service: Endoscopy;  Laterality: N/A;  . ROBOTIC ASSISTED LAPAROSCOPIC VAGINAL HYSTERECTOMY WITH FIBROID REMOVAL     Fibroid removal  . TONSILLECTOMY AND ADENOIDECTOMY     History   Social History Main Topics  . Smoking status: Never Smoker   . Smokeless tobacco: Not on file  . Alcohol Use: 0.0 oz/week    0 Standard drinks or equivalent per week  . Drug Use: No  . 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 ACCU-CHEK FASTCLIX LANCETS MISC Use to check two times daily 200 each 5  . ACCU-CHEK GUIDE test strip USE AS INSTRUCTED TO CHECK SUGAR TWO TIMES DAILY 200 each 5  . Apremilast (OTEZLA) 30 MG TABS Take 1 tablet by mouth 2 (two) times daily. 60 tablet 5  . Insulin Glargine (LANTUS SOLOSTAR) 100 UNIT/ML Solostar Pen INJECT 25 UNITS INTO THE SKIN AT BEDTIME. 30 mL 3  . INVOKANA 100 MG TABS tablet TAKE 1 TABLET (100 MG TOTAL) BY MOUTH DAILY. 90 tablet 3  . metFORMIN (GLUCOPHAGE-XR) 500 MG 24 hr tablet TAKE 4 TABLETS BY MOUTH DAILY WITH SUPPER 360 tablet 3  . Multiple Vitamin (MULTI-VITAMINS) TABS Take by mouth.    . nystatin-triamcinolone (MYCOLOG II) cream Apply 1 application topically 2 (two) times daily. Use for 10-14 days 30 g 2  . PARoxetine (PAXIL) 10 MG tablet TAKE 1 TABLET BY MOUTH ONCE DAILY 30 tablet 11  . PARoxetine Mesylate 7.5 MG CAPS Take 1 tablet by mouth daily. 30 capsule 11  . UNIFINE PENTIPS 32G X 4 MM MISC USE DAILY 100 each 3  . Vitamin D, Ergocalciferol, (DRISDOL) 1.25 MG (50000 UT) CAPS capsule TAKE 1 CAPSULE BY MOUTH 2 TIMES A WEEK. 24 capsule 3   No current facility-administered medications on file prior to visit.    Allergies  Allergen Reactions  . Clobetasol Rash    Ointment used  for psorasis   Family History  Problem Relation Age of Onset  . Diabetes Mother   . Hypertension Mother   . Colon cancer Father   . Heart disease Father   . Hypertension Father   . Cancer Father   . Colon cancer Sister   . Cancer Sister   . Breast cancer Paternal Aunt        times 3   PE: BP 128/78   Pulse (!) 101   Ht 5\' 5"  (1.651 m)   Wt 255 lb (115.7 kg)   LMP 05/01/2013   SpO2 97%   BMI 42.43 kg/m  Body mass index is 42.43 kg/m. Wt Readings from Last 3 Encounters:  07/26/19 255 lb (115.7 kg)  11/18/18 253 lb (114.8 kg)  09/09/18 252 lb (114.3 kg)   Constitutional: overweight, in NAD  Eyes: PERRLA, EOMI, no exophthalmos ENT: moist mucous membranes, no thyromegaly, no cervical lymphadenopathy Cardiovascular: RRR, No MRG Respiratory: CTA B Gastrointestinal: abdomen soft, NT, ND, BS+ Musculoskeletal: no deformities, strength intact in all 4 Skin: moist, warm, no rashes Neurological: no tremor with outstretched hands, DTR normal in all 4  ASSESSMENT: 1. DM2, insulin-dependent, w/o long term complications but with hyperglycemia.  2. HL  3.  Vitamin D deficiency  PLAN:  1. Patient with history of fairly well-controlled type 2 diabetes, with occasional hyperglycemic spikes due to dietary indiscretions.  At last visit, her HbA1c was 6.6%.  She does usually have higher blood sugars in the morning, consistent with phenomenon.  We did not change her regimen.   -Since last visit, sugars have not changed much, they remain above target in the morning but reviewing the sugars later in the day, these are also at the upper limit limit of normal or above this.  At this point, instead of increasing Lantus, I suggested to start Ozempic, which can help with both diabetes and weight.  We discussed about benefits and possible side effects.  We will start at a low dose and advance as tolerated.  Given coupon card. -I advised her that after she started Ozempic, if the sugars improved  significantly, she may need to reduce the Lantus dose - I suggested to:  Patient Instructions  Please continue: - Invokana 100 mg in a.m. - Metformin ER 2000 mg with dinner - Lantus 25 units at night.  Please start Ozempic 0.25 mg weekly in a.m. (for example on Sunday morning) x 4 weeks, then increase to 0.5 mg weekly in a.m. if no nausea or hypoglycemia.  Please come back for a follow-up appointment in 4 months.  - we checked her HbA1c: 6.6% (stable) - advised to check sugars at different times of the day - 1x a day, rotating check times - advised for yearly eye exams  - + Flu shot today - return to clinic in 4 months  2. HL -Reviewed lipid panel from 10/2018: LDL above goal Lab Results  Component Value Date   CHOL 217 (H) 11/22/2018   HDL 53 11/22/2018   LDLCALC 135 (H) 11/22/2018   LDLDIRECT 124.0 08/15/2016   TRIG 147 11/22/2018   CHOLHDL 4.1 11/22/2018  -She refused statins in the past including at last visit, when I suggested pravastatin  3. Vit D def -Previous vitamin D level was very low and we discussed about taking the ergocalciferol 50,000 units twice a week more consistently. -Most recent level was at goal, per review of the records brought by patient  Philemon Kingdom, MD PhD [facility] Endocrinology

## 2019-07-27 ENCOUNTER — Telehealth: Payer: Self-pay | Admitting: Internal Medicine

## 2019-07-27 NOTE — Telephone Encounter (Signed)
Patient called stating she was unable to pull up her Flu Shot from yesterday for work and wants to know if there was anyway would could attach it or send an inbox message.  Please Advise, Thanks

## 2019-07-28 ENCOUNTER — Encounter: Payer: Self-pay | Admitting: Internal Medicine

## 2019-07-28 MED FILL — INVOKANA 100 MG TABLET: 100 | 90 days supply | Qty: 90 | Fill #2

## 2019-08-01 NOTE — Telephone Encounter (Signed)
Already took care of this via [place].

## 2019-08-05 MED FILL — VIT D2 1.25 MG (50,000 UNIT: 1.25 MG | 84 days supply | Qty: 24 | Fill #2

## 2019-08-10 ENCOUNTER — Telehealth (INDEPENDENT_AMBULATORY_CARE_PROVIDER_SITE_OTHER): Payer: 59 | Admitting: Pharmacist

## 2019-08-10 DIAGNOSIS — Z79899 Other long term (current) drug therapy: Secondary | ICD-10-CM

## 2019-08-10 NOTE — Progress Notes (Signed)
   S: Patient presents today for review of her specialty medication.   Patient is currently taking Otezla for psoriatic arthritis. Patient is managed by Dr. Nehemiah Massed for this.   Adherence: denies any missed doses  Efficacy: still feels like it is working well overall for her psoriasis but the GI side effects are getting worse.  Monitoring: Weight loss: denies GI upset: started over the last year and were only happening once a week or so and now they are consistently occurring multiple days a week and impacting her daily life. Headache: denies S/sx of infection: denies Neuropsychiatric effects: denies   O:     Lab Results  Component Value Date   WBC 6.5 05/02/2015   HGB 13.3 05/08/2015   HCT 42.3 05/02/2015   MCV 88.3 05/02/2015   PLT 256 05/02/2015      Chemistry      Component Value Date/Time   NA 137 11/22/2018 0911   K 4.0 11/22/2018 0911   CL 105 11/22/2018 0911   CO2 23 11/22/2018 0911   BUN 14 11/22/2018 0911   CREATININE 0.62 11/22/2018 0911   CREATININE 0.76 11/23/2017 1049      Component Value Date/Time   CALCIUM 9.0 11/22/2018 0911   ALKPHOS 45 11/22/2018 0911   AST 43 (H) 11/22/2018 0911   ALT 74 (H) 11/22/2018 0911   BILITOT 0.5 11/22/2018 0911       A/P: 1. Medication review: patient on Otezla for psoriatic arthritis and it is working well for her psoriasis but she now has GI adverse effects that impact her daily life. She meets with her dermatologist next week and will discuss switching to an injectable therapy. Reviewed the medication with her, including the following: the need for regular follow up with rheumatologist, side effects, such as headache, GI upset, and increased risk of infection. Patient will contact me if she changes to a new medication.   Christella Hartigan, PharmD, BCPS, BCACP, CPP Clinical Pharmacist Practitioner  [phone]

## 2019-08-15 MED FILL — metFORMIN HCL ER 500 MG TB2: 500 | 30 days supply | Qty: 120 | Fill #4

## 2019-08-18 MED FILL — PARoxetine HCL 10 MG TABS: 10 | 30 days supply | Qty: 30 | Fill #4

## 2019-08-18 MED FILL — UNIFINE PENTIPS 32GX5/32: 32G X 4 MM | 90 days supply | Qty: 100 | Fill #1

## 2019-08-18 MED FILL — UNIFINE PENTIPS 32GX5/32": 32G X 4 MM | 90 days supply | Qty: 100 | Fill #1

## 2019-08-22 DIAGNOSIS — L400 Psoriasis vulgaris: Secondary | ICD-10-CM | POA: Diagnosis not present

## 2019-08-22 DIAGNOSIS — Z79899 Other long term (current) drug therapy: Secondary | ICD-10-CM | POA: Diagnosis not present

## 2019-08-22 DIAGNOSIS — Z7689 Persons encountering health services in other specified circumstances: Secondary | ICD-10-CM | POA: Diagnosis not present

## 2019-08-29 MED FILL — LANTUS SOLOSTAR 100 UNITS/M: 100 | 60 days supply | Qty: 15 | Fill #2

## 2019-09-01 ENCOUNTER — Telehealth: Payer: Self-pay | Admitting: Pharmacist

## 2019-09-01 NOTE — Telephone Encounter (Signed)
Patient called and left a voicemail and I returned her call: Patient called to let me know that she had been prescribed Oluminant and that it had been sent to an outside specialty pharmacy. She also told me that she met with her dermatologist and the plan at that time was to start [facility]. She then received a prescription from her dermatologist for Brinckerhoff. She wanted to discuss both medications as she was not sure which one she should be on. Reviewed both medications with her (adverse effects, mechanism of action, and indications), patient prefers Orson Ape and will discuss with her physician. 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

## 2019-09-06 ENCOUNTER — Telehealth (INDEPENDENT_AMBULATORY_CARE_PROVIDER_SITE_OTHER): Payer: 59 | Admitting: Pharmacist

## 2019-09-06 DIAGNOSIS — Z79899 Other long term (current) drug therapy: Secondary | ICD-10-CM

## 2019-09-06 MED ORDER — SKYRIZI (150 MG DOSE) 75 MG/0.83ML ~~LOC~~ PSKT: 2.0000 | PREFILLED_SYRINGE | SUBCUTANEOUS | 3 refills | Status: DC

## 2019-09-06 NOTE — Progress Notes (Signed)
   S: Patient presents for review of their specialty medication therapy.  Patient is currently taking Skyrizi for psoriasis. Patient is managed by Dr. Nehemiah Massed for this.   Adherence: has not started yet  Efficacy: has not started yet, recently failed Otezla due to GI adverse effects.  Dosing: Plaque psoriasis, moderate to severe: SubQ: Two consecutive injections (75 mg each) for a total dose of 150 mg at weeks 0, 4, and then every 12 weeks thereafter.  Screening: TB test: completed per patient  Monitoring: S/sx of infection: denies S/sx of hypersensitivity/injection site reaction: has not taken med yet.   O:     Lab Results  Component Value Date   WBC 6.5 05/02/2015   HGB 13.3 05/08/2015   HCT 42.3 05/02/2015   MCV 88.3 05/02/2015   PLT 256 05/02/2015      Chemistry      Component Value Date/Time   NA 137 11/22/2018 0911   K 4.0 11/22/2018 0911   CL 105 11/22/2018 0911   CO2 23 11/22/2018 0911   BUN 14 11/22/2018 0911   CREATININE 0.62 11/22/2018 0911   CREATININE 0.76 11/23/2017 1049      Component Value Date/Time   CALCIUM 9.0 11/22/2018 0911   ALKPHOS 45 11/22/2018 0911   AST 43 (H) 11/22/2018 0911   ALT 74 (H) 11/22/2018 0911   BILITOT 0.5 11/22/2018 0911       A/P: 1. Medication review: Patient currently on [place] for psoriasis. Reviewed the medication with the patient, including the following: Orson Ape is a monoclonal antibody used in the treatment of psoriasis. Patient educated on purpose, proper use and potential adverse effects of Skyrizi. Possible adverse effects are infections, headache, and injection site reactions. Live vaccinations should be avoided while on therapy. SubQ: Administer the two consecutive injections subcutaneously at different anatomic locations, such as thighs, abdomen, or back of upper arms. Do not inject into areas where the skin is tender, bruised, red, hard, or affected by psoriasis. Intended for use under supervision of a health  care professional; self-injection may occur after proper training (except back of upper arms). No recommendations for any changes at this time.  Christella Hartigan, PharmD, BCPS, BCACP, CPP Clinical Pharmacist Practitioner  [phone]

## 2019-09-07 ENCOUNTER — Other Ambulatory Visit: Payer: Self-pay | Admitting: Pharmacist

## 2019-09-07 MED ORDER — SKYRIZI (150 MG DOSE) 75 MG/0.83ML ~~LOC~~ PSKT: 2.0000 | PREFILLED_SYRINGE | SUBCUTANEOUS | 1 refills | Status: DC

## 2019-09-13 MED FILL — SKYRIZI (150 MG DOSE) 75 MG: 75 | 28 days supply | Qty: 1 | Fill #0

## 2019-09-19 MED FILL — PARoxetine HCL 10 MG TABS: 10 | 30 days supply | Qty: 30 | Fill #5

## 2019-10-04 MED FILL — SKYRIZI (150 MG DOSE) 75 MG: 75 | 28 days supply | Qty: 1 | Fill #1

## 2019-10-05 ENCOUNTER — Encounter: Payer: 59 | Admitting: Obstetrics and Gynecology

## 2019-10-13 ENCOUNTER — Other Ambulatory Visit: Payer: Self-pay | Admitting: Internal Medicine

## 2019-10-14 ENCOUNTER — Other Ambulatory Visit: Payer: Self-pay | Admitting: Pharmacist

## 2019-10-14 ENCOUNTER — Other Ambulatory Visit: Payer: Self-pay

## 2019-10-14 MED ORDER — SKYRIZI (150 MG DOSE) 75 MG/0.83ML ~~LOC~~ PSKT: 2.0000 | PREFILLED_SYRINGE | SUBCUTANEOUS | 3 refills | Status: DC

## 2019-10-14 MED ORDER — SKYRIZI (150 MG DOSE) 75 MG/0.83ML ~~LOC~~ PSKT: 2.0000 | PREFILLED_SYRINGE | SUBCUTANEOUS | 1 refills | Status: DC

## 2019-10-14 MED ORDER — FREESTYLE LANCETS MISC: 12 refills | Status: AC

## 2019-10-14 MED ORDER — FREESTYLE TEST VI STRP: ORAL_STRIP | 12 refills | Status: DC

## 2019-10-14 MED FILL — PARoxetine HCL 10 MG TABS: 10 | 30 days supply | Qty: 30 | Fill #6

## 2019-10-31 MED FILL — INVOKANA 100 MG TABLET: 100 | 90 days supply | Qty: 90 | Fill #3

## 2019-10-31 MED FILL — LANTUS SOLOSTAR 100 UNITS/M: 100 | 60 days supply | Qty: 15 | Fill #3

## 2019-11-02 DIAGNOSIS — L400 Psoriasis vulgaris: Secondary | ICD-10-CM | POA: Diagnosis not present

## 2019-11-14 ENCOUNTER — Other Ambulatory Visit: Payer: Self-pay | Admitting: Internal Medicine

## 2019-11-14 MED FILL — VIT D2 1.25 MG (50,000 UNIT: 1.25 MG | 84 days supply | Qty: 24 | Fill #0

## 2019-11-25 MED FILL — PARoxetine HCL 10 MG TABS: 10 | 30 days supply | Qty: 30 | Fill #7

## 2019-11-28 ENCOUNTER — Telehealth: Payer: Self-pay

## 2019-11-28 NOTE — Telephone Encounter (Signed)
Called pt to inform her that [facility] had an opening appointment at 3pm on 11/29/19 for an annual exam. Pt stated that she already had a scheduled appointment for tomorrow at 3pm.

## 2019-11-29 ENCOUNTER — Ambulatory Visit (INDEPENDENT_AMBULATORY_CARE_PROVIDER_SITE_OTHER): Payer: 59 | Admitting: Internal Medicine

## 2019-11-29 ENCOUNTER — Other Ambulatory Visit: Payer: Self-pay

## 2019-11-29 ENCOUNTER — Encounter: Payer: Self-pay | Admitting: Internal Medicine

## 2019-11-29 VITALS — BP 118/70 | HR 82 | Ht 65.0 in | Wt 256.0 lb

## 2019-11-29 DIAGNOSIS — E1165 Type 2 diabetes mellitus with hyperglycemia: Secondary | ICD-10-CM

## 2019-11-29 DIAGNOSIS — Z6841 Body Mass Index (BMI) 40.0 and over, adult: Secondary | ICD-10-CM | POA: Diagnosis not present

## 2019-11-29 DIAGNOSIS — E785 Hyperlipidemia, unspecified: Secondary | ICD-10-CM | POA: Diagnosis not present

## 2019-11-29 DIAGNOSIS — Z794 Long term (current) use of insulin: Secondary | ICD-10-CM | POA: Diagnosis not present

## 2019-11-29 DIAGNOSIS — E661 Drug-induced obesity: Secondary | ICD-10-CM

## 2019-11-29 LAB — POCT GLYCOSYLATED HEMOGLOBIN (HGB A1C): Hemoglobin A1C: 6.4 % — AB (ref 4.0–5.6)

## 2019-11-29 NOTE — Progress Notes (Signed)
ID: Cassandra Munoz, female   DOB: Feb 14, 1961, 59 y.o.   MRN: [id number]  This visit occurred during the SARS-CoV-2 public health emergency.  Safety protocols were in place, including screening questions prior to the visit, additional usage of staff PPE, and extensive cleaning of exam room while observing appropriate contact time as indicated for disinfecting solutions.   HPI: Cassandra Munoz is a 59 y.o.-year-old female, initially referred by [name] Harle Battiest), for management of DM2, 2012, insulin-dependent, uncontrolled, without long term complications. Last visit 4 months ago.  She started [place] for Psoriasis 4-5 years ago >> GI pbs >> stopped 07/2019 but GI symptoms persisted.  She then stopped Metformin >> GI sxs resolved and they restarted after trying a lower dose of Metformin (cramping, diarrhea).  She is now completely off the medication for 2 weeks.  Reviewed HbA1c levels: Lab Results  Component Value Date   HGBA1C 6.6 (A) 07/26/2019   HGBA1C 6.6 11/18/2018   HGBA1C 6.8 (A) 07/26/2018  2012: HbA1c 7%  She is on: - Invokana-added 05/2015: 100 mg daily  - Ozempic 0.5 mg weekly-added 06/2019  - Lantus-added 03/2015: 15 >> 20 >> 25 units Prev. On Metformin ER 1000 >> 2000 mg with dinner >> decreased dose and then only taking it sporadically due to GI symptoms >> stopped completely 2 days ago.  Pt checks her sugars 1-3 times a day: - am:  126-(ave 130-140)-160, 169 >> 120-160 >> 97, 106-139, 150, 159 (cookie), 197  -- 2h after b'fast 114 >> 128 >> n/c >> 93-127 - before lunch: 123 >> 125 >> 110-118 >> 119-134 >> 67, 91-135 - 2h after lunch:  121 >> 115, 175 >> n/c >> 125-165 >> 89, 110 - before dinner: 109-135, 143, 155 (snack) >> 104-149 >> 90-126 - 2h after dinner:  143, 159, 188 >> 149, 160, 191 >> 170-204 >> n/c - bedtime: 145-152, 204 >> 131-152 >> n/c >> 149-155 >> 116-139, 147, 166 - nighttime: n/c It is unclear at which level she has hypoglycemia awareness. Highest  sugar was 204 >> 197.  Glucometer: OneTouch  -No CKD, last BUN/creatinine:  06/15/2019: 17/0.79, glucose 130 Lab Results  Component Value Date   BUN 14 11/22/2018   Lab Results  Component Value Date   CREATININE 0.62 11/22/2018   -+ HL; last set of lipids: Lab Results  Component Value Date   CHOL 217 (H) 11/22/2018   HDL 53 11/22/2018   LDLCALC 135 (H) 11/22/2018   LDLDIRECT 124.0 08/15/2016   TRIG 147 11/22/2018   CHOLHDL 4.1 11/22/2018  She refused statins. - last eye exam was in 07/07/2019: No DR -No numbness and tingling in her feet.  She has a history of vitamin D deficiency (9.8 on 02/15/2015) >> currently on ergocalciferol 50,000 units twice a week.  06/15/2019: vit D 51.5 Lab Results  Component Value Date   VD25OH 17.2 (L) 11/22/2018   VD25OH 36.35 11/23/2017   VD25OH 60.31 08/15/2016   Denies joint or bone pains. She has a history of transaminitis. Also, history of psoriasis.  Latest TSH reviewed and this was normal: 06/15/2019: TSH 2.2  She saw [facility] >> Food allergy test >> she was found to have multiple allergies, including to gluten, dairy, nuts, soy, etc. She started to eliminate dairy and felt better.  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  ROS: Constitutional: no weight gain/no weight loss, no fatigue, no subjective hyperthermia, no subjective hypothermia Eyes: no blurry vision, no xerophthalmia ENT: no  sore throat, no nodules palpated in neck, no dysphagia, no odynophagia, no hoarseness Cardiovascular: no CP/no SOB/no palpitations/no leg swelling Respiratory: no cough/no SOB/no wheezing Gastrointestinal: no N/no V/no D/no C/no acid reflux Musculoskeletal: no muscle aches/no joint aches Skin: no rashes, no hair loss Neurological: no tremors/no numbness/no tingling/no dizziness  I reviewed pt's medications, allergies, PMH, social hx, family hx, and changes were documented in the history of present illness. Otherwise,  unchanged from my initial visit note.  Past Medical History:  Diagnosis Date  . Diabetes mellitus without complication ([place])   . Elevated cholesterol with elevated triglycerides   . Psoriasis   . Sciatic pain 2015  . Vitamin D deficiency    Past Surgical History:  Procedure Laterality Date  . ABDOMINAL HYSTERECTOMY N/A 05/07/2015   Procedure: HYSTERECTOMY ABDOMINAL;  Surgeon: Brayton Mars, MD;  Location: [facility] ORS;  Service: Gynecology;  Laterality: N/A;  . ANKLE ARTHROSCOPY Right   . COLONOSCOPY WITH PROPOFOL N/A 04/03/2016   Procedure: COLONOSCOPY WITH PROPOFOL;  Surgeon: Manya Silvas, MD;  Location: [facility] ENDOSCOPY;  Service: Endoscopy;  Laterality: N/A;  . ROBOTIC ASSISTED LAPAROSCOPIC VAGINAL HYSTERECTOMY WITH FIBROID REMOVAL     Fibroid removal  . TONSILLECTOMY AND ADENOIDECTOMY     History   Social History Main Topics  . Smoking status: Never Smoker   . Smokeless tobacco: Not on file  . Alcohol Use: 0.0 oz/week    0 Standard drinks or equivalent per week  . Drug Use: No  . 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 glucose blood (FREESTYLE TEST STRIPS) test strip Use to check blood sugar 2 times a day. 200 each 12  . Insulin Glargine (LANTUS SOLOSTAR) 100 UNIT/ML Solostar Pen INJECT 25 UNITS INTO THE SKIN AT BEDTIME. 30 mL 3  . INVOKANA 100 MG TABS tablet TAKE 1 TABLET (100 MG TOTAL) BY MOUTH DAILY. 90 tablet 3  . Lancets (FREESTYLE) lancets Use to check blood sugar 2 times a day. 200 each 12  . metFORMIN (GLUCOPHAGE-XR) 500 MG 24 hr tablet TAKE 4 TABLETS BY MOUTH DAILY WITH SUPPER 360 tablet 3  . Multiple Vitamin (MULTI-VITAMINS) TABS Take by mouth.    . nystatin-triamcinolone (MYCOLOG II) cream Apply 1 application topically 2  (two) times daily. Use for 10-14 days 30 g 2  . PARoxetine (PAXIL) 10 MG tablet TAKE 1 TABLET BY MOUTH ONCE DAILY 30 tablet 11  . PARoxetine Mesylate 7.5 MG CAPS Take 1 tablet by mouth daily. 30 capsule 11  . Risankizumab-rzaa,150 MG Dose, (SKYRIZI, 150 MG DOSE,) 75 MG/0.83ML PSKT Inject 2 Syringes into the skin as directed. Inject 2 syringes into the skin as directed every 12 weeks. 2 each 3  . Semaglutide,0.25 or 0.5MG /DOS, (OZEMPIC, 0.25 OR 0.5 MG/DOSE,) 2 MG/1.5ML SOPN Inject 0.5 mg into the skin once a week. 2 pen 5  . UNIFINE PENTIPS 32G X 4 MM MISC USE DAILY 100 each 3  . Vitamin D, Ergocalciferol, (DRISDOL) 1.25 MG (50000 UNIT) CAPS capsule TAKE 1 CAPSULE BY MOUTH 2 TIMES A WEEK. 24 capsule 2   No current facility-administered medications on file prior to visit.   Allergies  Allergen Reactions  . Clobetasol Rash    Ointment used for psorasis  Family History  Problem Relation Age of Onset  . Diabetes Mother   . Hypertension Mother   . Colon cancer Father   . Heart disease Father   . Hypertension Father   . Cancer Father   . Colon cancer Sister   . Cancer Sister   . Breast cancer Paternal Aunt        times 3   PE: BP 118/70   Pulse 82   Ht 5\' 5"  (1.651 m)   Wt 256 lb (116.1 kg)   LMP 05/01/2013   SpO2 99%   BMI 42.60 kg/m  Body mass index is 42.6 kg/m. Wt Readings from Last 3 Encounters:  11/29/19 256 lb (116.1 kg)  07/26/19 255 lb (115.7 kg)  11/18/18 253 lb (114.8 kg)   Constitutional: overweight, in NAD Eyes: PERRLA, EOMI, no exophthalmos ENT: moist mucous membranes, no thyromegaly, no cervical lymphadenopathy Cardiovascular: RRR, No MRG Respiratory: CTA B Gastrointestinal: abdomen soft, NT, ND, BS+ Musculoskeletal: no deformities, strength intact in all 4 Skin: moist, warm, no rashes Neurological: no tremor with outstretched hands, DTR normal in all 4  ASSESSMENT: 1. DM2, insulin-dependent, w/o long term complications but with hyperglycemia.  2.  HL  3.  Obesity class III  PLAN:  1. Patient with history of fairly well-controlled type 2 diabetes, with occasional hyperglycemic spikes due to dietary indiscretions.  At last visit, HbA1c was 6.6%.  She usually has higher blood sugars in the morning, consistent with dawn phenomenon.  At last visit, sugars were still above target in the morning and they were in the upper limit of normal or about this later in the day.  I suggested to start Ozempic at a low dose and increase as tolerated.  Given coupon card.  She is tolerating Ozempic well. -After the holidays, she started to improve her diet and eating more fruit and vegetables.  She also added berberine approximately a month ago.  Sugars improved, despite having had to stop Metformin due to GI symptoms.  We will continue off Metformin. -She tells me that since her sugars improved, she is occasionally using low doses of Lantus, which I think is adequate.  I advised her to rotate between 20 and 25 units and I am hoping that we can reduce the dose further at next visit - I suggested to:  Patient Instructions  Please continue: - Invokana 100 mg in a.m. - Ozempic 0.5 mg weekly  Please use: - Lantus 20-25 units at night.  Please come back for a follow-up appointment in 4 months  - we checked her HbA1c: 6.4% (better) - advised to check sugars at different times of the day - 1-2x a day, rotating check times - advised for yearly eye exams >> she is UTD - return to clinic in 4 months  2. HL -reviewed latest lipid panel from 10/2018: LDL above goal Lab Results  Component Value Date   CHOL 217 (H) 11/22/2018   HDL 53 11/22/2018   LDLCALC 135 (H) 11/22/2018   LDLDIRECT 124.0 08/15/2016   TRIG 147 11/22/2018   CHOLHDL 4.1 11/22/2018  -She refused statins repeatedly in the past -She has an annual physical exam with PCP next month.  3.  Obesity class III -Continue Ozempic which should also help with weight loss. -No weight loss since last  visit but she started to improve her diet and will also start walking outside when weather improves.  Philemon Kingdom, MD PhD [facility] End[facility]

## 2019-11-29 NOTE — Patient Instructions (Signed)
Please continue: - Invokana 100 mg in a.m. - Ozempic 0.5 mg weekly  Please use: - Lantus 20-25 units at night.  Please come back for a follow-up appointment in 4 months

## 2019-12-01 MED FILL — UNIFINE PENTIPS 32GX5/32: 32G X 4 MM | 90 days supply | Qty: 100 | Fill #2

## 2019-12-01 MED FILL — UNIFINE PENTIPS 32GX5/32": 32G X 4 MM | 90 days supply | Qty: 100 | Fill #2

## 2019-12-13 ENCOUNTER — Telehealth: Payer: Self-pay | Admitting: Internal Medicine

## 2019-12-13 NOTE — Telephone Encounter (Signed)
Let's try [place] same dose.

## 2019-12-13 NOTE — Telephone Encounter (Signed)
Patient called saying insurance will no longer cover lantis, but they do cover Basaglar, Tyler Aas and Levemir as alternatives. Ph# [phone]   Pharmacy:  [place], [address] Phone:  [phone]  Fax:  [phone]

## 2019-12-14 MED ORDER — TRESIBA FLEXTOUCH 100 UNIT/ML ~~LOC~~ SOPN: 25.0000 [IU] | PEN_INJECTOR | Freq: Every day | SUBCUTANEOUS | 1 refills | Status: DC

## 2019-12-14 NOTE — Addendum Note (Signed)
Addended by: Cardell Peach I on: 12/14/2019 01:16 PM   Modules accepted: Orders

## 2019-12-14 NOTE — Telephone Encounter (Signed)
Tresiba sent

## 2019-12-23 MED FILL — PARoxetine HCL 10 MG TABS: 10 | 30 days supply | Qty: 30 | Fill #8

## 2019-12-26 MED FILL — SKYRIZI (150 MG DOSE) 75 MG: 75 | 84 days supply | Qty: 1 | Fill #0

## 2020-01-12 ENCOUNTER — Ambulatory Visit (INDEPENDENT_AMBULATORY_CARE_PROVIDER_SITE_OTHER): Payer: 59 | Admitting: Obstetrics and Gynecology

## 2020-01-12 ENCOUNTER — Encounter: Payer: Self-pay | Admitting: Obstetrics and Gynecology

## 2020-01-12 ENCOUNTER — Other Ambulatory Visit: Payer: Self-pay

## 2020-01-12 VITALS — BP 116/74 | HR 103 | Ht 65.0 in | Wt 257.4 lb

## 2020-01-12 DIAGNOSIS — I10 Essential (primary) hypertension: Secondary | ICD-10-CM

## 2020-01-12 DIAGNOSIS — Z01419 Encounter for gynecological examination (general) (routine) without abnormal findings: Secondary | ICD-10-CM

## 2020-01-12 DIAGNOSIS — E119 Type 2 diabetes mellitus without complications: Secondary | ICD-10-CM | POA: Diagnosis not present

## 2020-01-12 DIAGNOSIS — N951 Menopausal and female climacteric states: Secondary | ICD-10-CM | POA: Diagnosis not present

## 2020-01-12 DIAGNOSIS — E785 Hyperlipidemia, unspecified: Secondary | ICD-10-CM

## 2020-01-12 DIAGNOSIS — E8940 Asymptomatic postprocedural ovarian failure: Secondary | ICD-10-CM

## 2020-01-12 DIAGNOSIS — N952 Postmenopausal atrophic vaginitis: Secondary | ICD-10-CM

## 2020-01-12 NOTE — Progress Notes (Signed)
ANNUAL PREVENTATIVE CARE GYNECOLOGY  ENCOUNTER NOTE  Subjective:       Cassandra Munoz is a 59 y.o. G0P0000 menopausal female here for a routine annual gynecologic exam. The patient is sexually active. The patient has never been taking hormone replacement therapy. Patient denies post-menopausal vaginal bleeding. The patient wears seatbelts: yes. The patient participates in regular exercise: no. Has the patient ever been transfused or tattooed?: no. The patient reports that there is not domestic violence in her life.  Current complaints: None.  Patient notes that the skin tag and hemorrhoids that have been chronically irritating have finally resolved.    Gynecologic History Patient's last menstrual period was 05/01/2013. Patient is s/p hysterectomy.  Contraception: status post hysterectomy, menopausal Last Pap: 04/12/2018. Results were: normal Last mammogram: 04/2015. Results were: normal Last Colonoscopy: 04/03/2016. Results were: normal   Obstetric History OB History  Gravida Para Term Preterm AB Living  0 0 0 0 0 0  SAB TAB Ectopic Multiple Live Births  0 0 0 0 0    Past Medical History:  Diagnosis Date  . Diabetes mellitus without complication ([place])   . Elevated cholesterol with elevated triglycerides   . Psoriasis   . Sciatic pain 2015  . Vitamin D deficiency     Family History  Problem Relation Age of Onset  . Diabetes Mother   . Hypertension Mother   . Colon cancer Father   . Heart disease Father   . Hypertension Father   . Cancer Father   . Colon cancer Sister   . Cancer Sister   . Breast cancer Paternal Aunt        times 3    Past Surgical History:  Procedure Laterality Date  . ABDOMINAL HYSTERECTOMY N/A 05/07/2015   Procedure: HYSTERECTOMY ABDOMINAL;  Surgeon: Brayton Mars, MD;  Location: [facility] ORS;  Service: Gynecology;  Laterality: N/A;  . ANKLE ARTHROSCOPY Right   . COLONOSCOPY WITH PROPOFOL N/A 04/03/2016   Procedure: COLONOSCOPY WITH PROPOFOL;   Surgeon: Manya Silvas, MD;  Location: [facility] ENDOSCOPY;  Service: Endoscopy;  Laterality: N/A;  . ROBOTIC ASSISTED LAPAROSCOPIC VAGINAL HYSTERECTOMY WITH FIBROID REMOVAL     Fibroid removal  . TONSILLECTOMY AND ADENOIDECTOMY      Social History   Socioeconomic History  . Marital status: Married    Spouse name: Not on file  . Number of children: Not on file  . Years of education: Not on file  . Highest education level: Not on file  Occupational History  . Not on file  Tobacco Use  . Smoking status: Never Smoker  . Smokeless tobacco: Never Used  Substance and Sexual Activity  . Alcohol use: No    Alcohol/week: 0.0 standard drinks    Comment: maybe 1 drink/month - socially  . Drug use: No  . Sexual activity: Yes    Birth control/protection: None  Other Topics Concern  . 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 Difficulty of Paying Living Expenses:   Food Insecurity:   . Worried About Charity fundraiser in the Last Year:   . Arboriculturist in the Last Year:   Transportation Needs:   . Film/video editor (Medical):   Marland Kitchen  Lack of Transportation (Non-Medical):   Physical Activity:   . Days of Exercise per Week:   . Minutes of Exercise per Session:   Stress:   . Feeling of Stress :   Social Connections:   . Frequency of Communication with Friends and Family:   . Frequency of Social Gatherings with Friends and Family:   . Attends Religious Services:   . Active Member of Clubs or Organizations:   . Attends Archivist Meetings:   Marland Kitchen Marital Status:   Intimate Partner Violence:   . Fear of Current or Ex-Partner:   . Emotionally Abused:   Marland Kitchen Physically Abused:   . Sexually Abused:     Current Outpatient Medications on File Prior to Visit  Medication  Sig Dispense Refill  . glucose blood (FREESTYLE TEST STRIPS) test strip Use to check blood sugar 2 times a day. 200 each 12  . insulin degludec (TRESIBA FLEXTOUCH) 100 UNIT/ML SOPN FlexTouch Pen Inject 0.25 mLs (25 Units total) into the skin at bedtime. 30 mL 1  . INVOKANA 100 MG TABS tablet TAKE 1 TABLET (100 MG TOTAL) BY MOUTH DAILY. 90 tablet 3  . Lancets (FREESTYLE) lancets Use to check blood sugar 2 times a day. 200 each 12  . Multiple Vitamin (MULTI-VITAMINS) TABS Take by mouth.    . nystatin-triamcinolone (MYCOLOG II) cream Apply 1 application topically 2 (two) times daily. Use for 10-14 days 30 g 2  . PARoxetine (PAXIL) 10 MG tablet TAKE 1 TABLET BY MOUTH ONCE DAILY 30 tablet 11  . Risankizumab-rzaa,150 MG Dose, (SKYRIZI, 150 MG DOSE,) 75 MG/0.83ML PSKT Inject 2 Syringes into the skin as directed. Inject 2 syringes into the skin as directed every 12 weeks. 2 each 3  . Semaglutide,0.25 or 0.5MG /DOS, (OZEMPIC, 0.25 OR 0.5 MG/DOSE,) 2 MG/1.5ML SOPN Inject 0.5 mg into the skin once a week. 2 pen 5  . UNIFINE PENTIPS 32G X 4 MM MISC USE DAILY 100 each 3  . Vitamin D, Ergocalciferol, (DRISDOL) 1.25 MG (50000 UNIT) CAPS capsule TAKE 1 CAPSULE BY MOUTH 2 TIMES A WEEK. 24 capsule 2  . metFORMIN (GLUCOPHAGE-XR) 500 MG 24 hr tablet TAKE 4 TABLETS BY MOUTH DAILY WITH SUPPER (Patient not taking: Reported on 01/12/2020) 360 tablet 3  . PARoxetine Mesylate 7.5 MG CAPS Take 1 tablet by mouth daily. (Patient not taking: Reported on 01/12/2020) 30 capsule 11   No current facility-administered medications on file prior to visit.    Allergies  Allergen Reactions  . Clobetasol Rash    Ointment used for psorasis      Review of Systems ROS Review of Systems - General ROS: negative for - chills, fatigue, fever, weight gain or weight loss.  Psychological ROS: negative for - anxiety, decreased libido, depression, mood swings, physical abuse or sexual abuse Ophthalmic ROS: negative for - blurry vision, eye  pain or loss of vision ENT ROS: negative for - headaches, hearing change, visual changes or vocal changes Allergy and Immunology ROS: negative for - hives, itchy/watery eyes or seasonal allergies Hematological and Lymphatic ROS: negative for - bleeding problems, bruising, swollen lymph nodes or weight loss Endocrine ROS: negative for - galactorrhea, hair pattern changes, hot flashes, malaise/lethargy, mood swings, palpitations, polydipsia/polyuria, skin changes, temperature intolerance or unexpected weight changes Breast ROS: negative for - new or changing breast lumps or nipple discharge Respiratory ROS: negative for - cough or shortness of breath Cardiovascular ROS: negative for - chest pain, irregular heartbeat, palpitations or shortness of breath Gastrointestinal ROS:  no abdominal pain, change in bowel habits, or black or bloody stools Genito-Urinary ROS: no dysuria, trouble voiding, or hematuria Musculoskeletal ROS: negative for - joint pain or joint stiffness Neurological ROS: negative for - bowel and bladder control changes Dermatological ROS: negative for rash and skin lesion changes   Objective:   BP 116/74   Pulse (!) 103   Ht 5\' 5"  (1.651 m)   Wt 257 lb 6.4 oz (116.8 kg)   LMP 05/01/2013   BMI 42.83 kg/m  CONSTITUTIONAL: Well-developed, morbidly obese female in no acute distress.  PSYCHIATRIC: Normal mood and affect. Normal behavior. Normal judgment and thought content. [place]: Alert and oriented to person, place, and time. Normal muscle tone coordination. No cranial nerve deficit noted. HENT:  Normocephalic, atraumatic, External right and left ear normal. Oropharynx is clear and moist EYES: Conjunctivae and EOM are normal. Pupils are equal, round, and reactive to light. No scleral icterus.  NECK: Normal range of motion, supple, no masses.  Normal thyroid.  SKIN: Skin is warm and dry. No rash noted. Not diaphoretic. No erythema. No pallor.    CARDIOVASCULAR: Normal heart  rate noted, regular rhythm, no murmur. RESPIRATORY: Clear to auscultation bilaterally. Effort and breath sounds normal, no problems with respiration noted. BREASTS: Symmetric in size. No masses, skin changes, nipple drainage, or lymphadenopathy. ABDOMEN: Soft, normal bowel sounds, no distention noted.  No tenderness, rebound or guarding.  BLADDER: Normal PELVIC:  Bladder no bladder distension noted  Urethra: normal appearing urethra with no masses, tenderness or lesions  Vulva: normal appearing vulva with no masses, tenderness or lesions  Vagina: mildly atrophic, no discharge, no lesions  Cervix: surgically absent  Uterus: surgically absent  Adnexa: normal adnexa in size, nontender and no masses  RV: External Exam NormaI, No Rectal Masses and Normal Sphincter tone  MUSCULOSKELETAL: Normal range of motion. No tenderness.  No cyanosis, clubbing, or edema.  2+ distal pulses. LYMPHATIC: No Axillary, Supraclavicular, or Inguinal Adenopathy.   Labs: Performed by PCP.   Lab Results  Component Value Date   TSH 2.270 04/12/2018    Lab Results  Component Value Date   HGBA1C 6.4 (A) 11/29/2019     Assessment:    1. Encounter for well woman exam with routine gynecological exam   2. Morbid obesity ([place])   3. Type 2 diabetes mellitus without complication, without long-term current use of insulin ([place])   4. Essential hypertension   5. Hyperlipidemia, unspecified hyperlipidemia type   6. Surgical menopause   7. Vaginal atrophy   8. Menopausal vasomotor syndrome     Plan:  - Pap: Not needed.  Patient is s/p hysterectomy.  - Mammogram: Patient reports significant pain associated with last mammogram, has hefty reservations surrounding getting another. Plans to utilize a company ([employer]), with traveling imaging, perform breast imaging with ultrasounds. Discussed that with large or dense breasts ultrasounds may not be as sensitive, however patient desires to try it this year.  - Stool  Guaiac Testing:  Not Indicated.  Patient up to date with colonoscopy.  - Labs: None ordered. Had labs by PCP earlier this year.  - Routine preventative health maintenance measures emphasized: Exercise/Diet/Weight control, Alcohol/Substance use risks and Stress Management - Menopausal vasomotor symptoms managed with Paxil.  - Discussed obesity and weight loss again with patient. Desired to begin weight loss last year with medications, however had difficulty getting approved. Has had some medication changes recently with regards to her diabetes, including stopping the Metformin due to GI issues,  and now on Ozempic (which is currently under FDA review for weight loss indication).  Patient currently notes that right now she is not very physically active. Does manage her diet.  with patient and option of starting a weight loss medication to help as she has never tried this before.  Also discussed that if bariatric surgery were indeed an option, that it would be a requirement to show that she was able to lose some weight successfully. Advised on use of medication in conjunction with exercise and diet.   - Diabetes and hyperlipidemia managed by her Endocrinologist. - Patient desires refill on her skin cream in case of recurrence of vulvar yeast infections due to her diabetes and obesity. Will refill. - Return in 1 year for annual exam.     Rubie Maid, MD [facility]

## 2020-01-12 NOTE — Patient Instructions (Signed)
Health Maintenance for Postmenopausal Women Menopause is a normal process in which your ability to get pregnant comes to an end. This process happens slowly over many months or years, usually between the ages of 48 and 55. Menopause is complete when you have missed your menstrual periods for 12 months. It is important to talk with your health care provider about some of the most common conditions that affect women after menopause (postmenopausal women). These include heart disease, cancer, and bone loss (osteoporosis). Adopting a healthy lifestyle and getting preventive care can help to promote your health and wellness. The actions you take can also lower your chances of developing some of these common conditions. What should I know about menopause? During menopause, you may get a number of symptoms, such as:  Hot flashes. These can be moderate or severe.  Night sweats.  Decrease in sex drive.  Mood swings.  Headaches.  Tiredness.  Irritability.  Memory problems.  Insomnia. Choosing to treat or not to treat these symptoms is a decision that you make with your health care provider. Do I need hormone replacement therapy?  Hormone replacement therapy is effective in treating symptoms that are caused by menopause, such as hot flashes and night sweats.  Hormone replacement carries certain risks, especially as you become older. If you are thinking about using estrogen or estrogen with progestin, discuss the benefits and risks with your health care provider. What is my risk for heart disease and stroke? The risk of heart disease, heart attack, and stroke increases as you age. One of the causes may be a change in the body's hormones during menopause. This can affect how your body uses dietary fats, triglycerides, and cholesterol. Heart attack and stroke are medical emergencies. There are many things that you can do to help prevent heart disease and stroke. Watch your blood pressure  High  blood pressure causes heart disease and increases the risk of stroke. This is more likely to develop in people who have high blood pressure readings, are of African descent, or are overweight.  Have your blood pressure checked: ? Every 3-5 years if you are 18-39 years of age. ? Every year if you are 40 years old or older. Eat a healthy diet   Eat a diet that includes plenty of vegetables, fruits, low-fat dairy products, and lean protein.  Do not eat a lot of foods that are high in solid fats, added sugars, or sodium. Get regular exercise Get regular exercise. This is one of the most important things you can do for your health. Most adults should:  Try to exercise for at least 150 minutes each week. The exercise should increase your heart rate and make you sweat (moderate-intensity exercise).  Try to do strengthening exercises at least twice each week. Do these in addition to the moderate-intensity exercise.  Spend less time sitting. Even light physical activity can be beneficial. Other tips  Work with your health care provider to achieve or maintain a healthy weight.  Do not use any products that contain nicotine or tobacco, such as cigarettes, e-cigarettes, and chewing tobacco. If you need help quitting, ask your health care provider.  Know your numbers. Ask your health care provider to check your cholesterol and your blood sugar (glucose). Continue to have your blood tested as directed by your health care provider. Do I need screening for cancer? Depending on your health history and family history, you may need to have cancer screening at different stages of your life. This   may include screening for:  Breast cancer.  Cervical cancer.  Lung cancer.  Colorectal cancer. What is my risk for osteoporosis? After menopause, you may be at increased risk for osteoporosis. Osteoporosis is a condition in which bone destruction happens more quickly than new bone creation. To help prevent  osteoporosis or the bone fractures that can happen because of osteoporosis, you may take the following actions:  If you are 19-50 years old, get at least 1,000 mg of calcium and at least 600 mg of vitamin D per day.  If you are older than age 50 but younger than age 70, get at least 1,200 mg of calcium and at least 600 mg of vitamin D per day.  If you are older than age 70, get at least 1,200 mg of calcium and at least 800 mg of vitamin D per day. Smoking and drinking excessive alcohol increase the risk of osteoporosis. Eat foods that are rich in calcium and vitamin D, and do weight-bearing exercises several times each week as directed by your health care provider. How does menopause affect my mental health? Depression may occur at any age, but it is more common as you become older. Common symptoms of depression include:  Low or sad mood.  Changes in sleep patterns.  Changes in appetite or eating patterns.  Feeling an overall lack of motivation or enjoyment of activities that you previously enjoyed.  Frequent crying spells. Talk with your health care provider if you think that you are experiencing depression. General instructions See your health care provider for regular wellness exams and vaccines. This may include:  Scheduling regular health, dental, and eye exams.  Getting and maintaining your vaccines. These include: ? Influenza vaccine. Get this vaccine each year before the flu season begins. ? Pneumonia vaccine. ? Shingles vaccine. ? Tetanus, diphtheria, and pertussis (Tdap) booster vaccine. Your health care provider may also recommend other immunizations. Tell your health care provider if you have ever been abused or do not feel safe at home. Summary  Menopause is a normal process in which your ability to get pregnant comes to an end.  This condition causes hot flashes, night sweats, decreased interest in sex, mood swings, headaches, or lack of sleep.  Treatment for this  condition may include hormone replacement therapy.  Take actions to keep yourself healthy, including exercising regularly, eating a healthy diet, watching your weight, and checking your blood pressure and blood sugar levels.  Get screened for cancer and depression. Make sure that you are up to date with all your vaccines. This information is not intended to replace advice given to you by your health care provider. Make sure you discuss any questions you have with your health care provider. Document Revised: 10/06/2018 Document Reviewed: 10/06/2018 Elsevier Patient Education  2020 [employer]    Breast Self-Awareness Breast self-awareness means being familiar with how your breasts look and feel. It involves checking your breasts regularly and reporting any changes to your health care provider. Practicing breast self-awareness is important. Sometimes changes may not be harmful (are benign), but sometimes a change in your breasts can be a sign of a serious medical problem. It is important to learn how to do this procedure correctly so that you can catch problems early, when treatment is more likely to be successful. All women should practice breast self-awareness, including women who have had breast implants. What you need:  A mirror.  A well-lit room. How to do a breast self-exam A breast self-exam is   one way to learn what is normal for your breasts and whether your breasts are changing. To do a breast self-exam: Look for changes  1. Remove all the clothing above your waist. 2. Stand in front of a mirror in a room with good lighting. 3. Put your hands on your hips. 4. Push your hands firmly downward. 5. Compare your breasts in the mirror. Look for differences between them (asymmetry), such as: ? Differences in shape. ? Differences in size. ? Puckers, dips, and bumps in one breast and not the other. 6. Look at each breast for changes in the skin, such as: ? Redness. ? Scaly  areas. 7. Look for changes in your nipples, such as: ? Discharge. ? Bleeding. ? Dimpling. ? Redness. ? A change in position. Feel for changes Carefully feel your breasts for lumps and changes. It is best to do this while lying on your back on the floor, and again while sitting or standing in the tub or shower with soapy water on your skin. Feel each breast in the following way: 1. Place the arm on the side of the breast you are examining above your head. 2. Feel your breast with the other hand. 3. Start in the nipple area and make -inch (2 cm) overlapping circles to feel your breast. Use the pads of your three middle fingers to do this. Apply light pressure, then medium pressure, then firm pressure. The light pressure will allow you to feel the tissue closest to the skin. The medium pressure will allow you to feel the tissue that is a little deeper. The firm pressure will allow you to feel the tissue close to the ribs. 4. Continue the overlapping circles, moving downward over the breast until you feel your ribs below your breast. 5. Move one finger-width toward the center of the body. Continue to use the -inch (2 cm) overlapping circles to feel your breast as you move slowly up toward your collarbone. 6. Continue the up-and-down exam using all three pressures until you reach your armpit.  Write down what you find Writing down what you find can help you remember what to discuss with your health care provider. Write down:  What is normal for each breast.  Any changes that you find in each breast, including: ? The kind of changes you find. ? Any pain or tenderness. ? Size and location of any lumps.  Where you are in your menstrual cycle, if you are still menstruating. General tips and recommendations  Examine your breasts every month.  If you are breastfeeding, the best time to examine your breasts is after a feeding or after using a breast pump.  If you menstruate, the best time to  examine your breasts is 5-7 days after your period. Breasts are generally lumpier during menstrual periods, and it may be more difficult to notice changes.  With time and practice, you will become more familiar with the variations in your breasts and more comfortable with the exam. Contact a health care provider if you:  See a change in the shape or size of your breasts or nipples.  See a change in the skin of your breast or nipples, such as a reddened or scaly area.  Have unusual discharge from your nipples.  Find a lump or thick area that was not there before.  Have pain in your breasts.  Have any concerns related to your breast health. Summary  Breast self-awareness includes looking for physical changes in your breasts, as well   as feeling for any changes within your breasts.  Breast self-awareness should be performed in front of a mirror in a well-lit room.  You should examine your breasts every month. If you menstruate, the best time to examine your breasts is 5-7 days after your menstrual period.  Let your health care provider know of any changes you notice in your breasts, including changes in size, changes on the skin, pain or tenderness, or unusual fluid from your nipples. This information is not intended to replace advice given to you by your health care provider. Make sure you discuss any questions you have with your health care provider. Document Revised: 06/01/2018 Document Reviewed: 06/01/2018 Elsevier Patient Education  2020 Elsevier Inc.  

## 2020-01-12 NOTE — Progress Notes (Signed)
Pt present for annual exam. Pt has appointment with [facility] for mammogram on Mar 29, 2020.  Pt denies any issues at this time.

## 2020-01-30 ENCOUNTER — Other Ambulatory Visit: Payer: Self-pay | Admitting: Internal Medicine

## 2020-02-08 ENCOUNTER — Ambulatory Visit (INDEPENDENT_AMBULATORY_CARE_PROVIDER_SITE_OTHER): Payer: 59 | Admitting: Dermatology

## 2020-02-08 ENCOUNTER — Other Ambulatory Visit: Payer: Self-pay

## 2020-02-08 ENCOUNTER — Encounter: Payer: Self-pay | Admitting: Dermatology

## 2020-02-08 DIAGNOSIS — L578 Other skin changes due to chronic exposure to nonionizing radiation: Secondary | ICD-10-CM

## 2020-02-08 DIAGNOSIS — D485 Neoplasm of uncertain behavior of skin: Secondary | ICD-10-CM

## 2020-02-08 DIAGNOSIS — L409 Psoriasis, unspecified: Secondary | ICD-10-CM | POA: Diagnosis not present

## 2020-02-08 DIAGNOSIS — D229 Melanocytic nevi, unspecified: Secondary | ICD-10-CM

## 2020-02-08 DIAGNOSIS — D2262 Melanocytic nevi of left upper limb, including shoulder: Secondary | ICD-10-CM | POA: Diagnosis not present

## 2020-02-08 HISTORY — DX: Melanocytic nevi, unspecified: D22.9

## 2020-02-08 NOTE — Patient Instructions (Signed)

## 2020-02-08 NOTE — Progress Notes (Signed)
   Follow-Up Visit   Subjective  Cassandra Munoz is a 59 y.o. female who presents for the following: Psoriasis (arms, scalp, waistline). Patient is currently using Skykrizi injections, started with loading dose in December 2020. Maintenance dose on March 12. Patient has had much improvement with little to no breakouts. She has mild itching in her scalp, otherwise clear. Patient has a mole on her left forearm that's been present for years. A brown spot developed at the edge a couple years ago. She hasn't noticed any other changes in mole.  The following portions of the chart were reviewed this encounter and updated as appropriate: Tobacco  Allergies  Meds  Problems  Med Hx  Surg Hx  Fam Hx      Review of Systems: No other skin or systemic complaints.  Objective  Well appearing patient in no apparent distress; mood and affect are within normal limits.  A focused examination was performed including arms, scalp, waistline. Relevant physical exam findings are noted in the Assessment and Plan.  Objective  Left Forearm: 0.5 cm flesh papule with brown macule at edge     Objective  Scalp: Mild scale on mastoid scalp. Mild roughness on bil elbows, forearm.  Assessment & Plan  Neoplasm of uncertain behavior of skin Left Forearm  Epidermal / dermal shaving  Lesion length (cm):  0.5 Lesion width (cm):  0.5 Margin per side (cm):  0.2 Total excision diameter (cm):  0.9 Informed consent: discussed and consent obtained   Timeout: patient name, date of birth, surgical site, and procedure verified   Procedure prep:  Patient was prepped and draped in usual sterile fashion Prep type:  Isopropyl alcohol Anesthesia: the lesion was anesthetized in a standard fashion   Anesthetic:  1% lidocaine w/ epinephrine 1-100,000 buffered w/ 8.4% NaHCO3 Instrument used: flexible razor blade   Hemostasis achieved with: pressure, aluminum chloride and electrodesiccation   Outcome: patient tolerated  procedure well   Post-procedure details: sterile dressing applied and wound care instructions given   Dressing type: bandage and petrolatum    Specimen 1 - Surgical pathology Differential Diagnosis: Nevus vs Dysplastic Nevus Check Margins: No 0.5 cm flesh papule with brown macule at edge  Psoriasis, severe on systemic Skyrizi (biologic medication).  Patient is tolerating well and results are very good with significant clearing.  BSA is now about 3% We will plan labs on follow-up. Scalp  Much improved on Skyrizi. Continue Skyrizi injections q 12 weeks.  Continue topical steroid spray (Enstilar?) prn flares. Pt unsure of name, will call when needs refills.  Actinic Damage - diffuse scaly erythematous macules with underlying dyspigmentation - Recommend daily broad spectrum sunscreen SPF 30+ to sun-exposed areas, reapply every 2 hours as needed.  - Call for new or changing lesions.    Return in about 6 months (around 08/09/2020) for psoriasis.   Lindi Adie, CMA, am acting as scribe for Sarina Ser, MD . Documentation: I have reviewed the above documentation for accuracy and completeness, and I agree with the above.  Sarina Ser, MD

## 2020-02-13 ENCOUNTER — Telehealth: Payer: Self-pay

## 2020-02-13 NOTE — Telephone Encounter (Signed)
Discussed path with Dr Nira Retort.  The spot is atypical and Dr Nira Retort wants to send out for further evaluation.  I authorized her to send specimen for this.

## 2020-02-13 NOTE — Telephone Encounter (Signed)
Pathologist Dr Nira Retort left a message today at 8:34am requesting you call her to discuss this patient, [phone]

## 2020-02-21 ENCOUNTER — Telehealth: Payer: Self-pay

## 2020-02-21 NOTE — Telephone Encounter (Signed)
Pt calling for biopsy results. Discussed with pt biopsy results still pending

## 2020-02-23 ENCOUNTER — Telehealth: Payer: Self-pay

## 2020-02-23 NOTE — Telephone Encounter (Signed)
Advised patient of biopsy results and of Myriad testing results. Advised that, even though benign, it is still recommended that area be excised. She was at work when I called her so she will call back to schedule a surgery appointment.

## 2020-02-23 NOTE — Telephone Encounter (Signed)
-----  Message from Ralene Bathe, MD sent at 02/23/2020  1:05 PM EDT ----- ADDENDUM: The Myriad MyPath melanoma score is -14.7. The score classifies the gene signature as benign. Please see attached copy of the report.  The Myriad score is negative for melanoma = BENIGN, but since there are irregular cells, would recommend excision. Schedule surgery

## 2020-02-27 ENCOUNTER — Other Ambulatory Visit: Payer: Self-pay | Admitting: Internal Medicine

## 2020-03-01 DIAGNOSIS — D2262 Melanocytic nevi of left upper limb, including shoulder: Secondary | ICD-10-CM | POA: Diagnosis not present

## 2020-03-01 DIAGNOSIS — L400 Psoriasis vulgaris: Secondary | ICD-10-CM | POA: Diagnosis not present

## 2020-03-01 DIAGNOSIS — D225 Melanocytic nevi of trunk: Secondary | ICD-10-CM | POA: Diagnosis not present

## 2020-03-01 DIAGNOSIS — D2272 Melanocytic nevi of left lower limb, including hip: Secondary | ICD-10-CM | POA: Diagnosis not present

## 2020-03-01 DIAGNOSIS — D2271 Melanocytic nevi of right lower limb, including hip: Secondary | ICD-10-CM | POA: Diagnosis not present

## 2020-03-01 DIAGNOSIS — D2261 Melanocytic nevi of right upper limb, including shoulder: Secondary | ICD-10-CM | POA: Diagnosis not present

## 2020-03-01 DIAGNOSIS — L821 Other seborrheic keratosis: Secondary | ICD-10-CM | POA: Diagnosis not present

## 2020-03-22 ENCOUNTER — Other Ambulatory Visit: Payer: Self-pay | Admitting: Internal Medicine

## 2020-03-22 MED FILL — UNIFINE PENTIPS 32GX5/32: 32G X 4 MM | 90 days supply | Qty: 100 | Fill #0

## 2020-03-27 ENCOUNTER — Ambulatory Visit: Payer: 59 | Admitting: Internal Medicine

## 2020-03-27 MED FILL — SKYRIZI (150 MG DOSE) 75 MG: 75 | 84 days supply | Qty: 1 | Fill #1

## 2020-03-29 ENCOUNTER — Telehealth: Payer: Self-pay

## 2020-03-29 NOTE — Telephone Encounter (Signed)
Pt was to call and schedule surgery appt for melanocytic proliferation of L forearm back in April.  Called pt to see if I could schedule her for surgery appointment.  Pt said she has decided not to have surgery.  I advised I would let Dr. Nehemiah Massed know and that he would recheck at her f/u in [place]

## 2020-04-16 NOTE — Telephone Encounter (Signed)
Advised pt of the following per Dr. Nehemiah Massed:  Please call and advise patient that I reviewed her pathology and Myriad test (benign) with Dr Nira Retort at [facility]. Even though her Myriad test was "benign", it does not test for Pre-Cancerous potential.  Dr Illene Silver microscopic exam of specimen did show Pre-Cancerous (Moderate dysplastic) changes.   Because of this - although one option is to excise, that may not be necessary -- and we can just re-check at her next visit (October 2021?).  If there is some evidence of persistence or recurrence, we would definitely recommend doing a shave removal -- and Dr Nira Retort does say that the atypical cells appear to go to the base of the lesion.  Pt advised to keep f/u appt 08/09/20 at 9:45./sh

## 2020-04-16 NOTE — Telephone Encounter (Signed)
Please call and advise patient that I reviewed her pathology and Myriad test (benign) with Dr Nira Retort at [facility]. Even though her Myriad test was "benign", it does not test for Pre-Cancerous potential.  Dr Illene Silver microscopic exam of specimen did show Pre-Cancerous (Moderate dysplastic) changes.   Because of this - although one option is to excise, that may not be necessary -- and we can just re-check at her next visit (October 2021?).  If there is some evidence of persistence or recurrence, we would definitely recommend doing a shave removal -- and Dr Nira Retort does say that the atypical cells appear to go to the base of the lesion.

## 2020-04-20 ENCOUNTER — Other Ambulatory Visit: Payer: Self-pay

## 2020-04-20 ENCOUNTER — Encounter: Payer: Self-pay | Admitting: Internal Medicine

## 2020-04-20 ENCOUNTER — Ambulatory Visit (INDEPENDENT_AMBULATORY_CARE_PROVIDER_SITE_OTHER): Payer: 59 | Admitting: Internal Medicine

## 2020-04-20 VITALS — BP 120/82 | HR 88 | Ht 65.0 in | Wt 255.0 lb

## 2020-04-20 DIAGNOSIS — E669 Obesity, unspecified: Secondary | ICD-10-CM | POA: Diagnosis not present

## 2020-04-20 DIAGNOSIS — Z794 Long term (current) use of insulin: Secondary | ICD-10-CM

## 2020-04-20 DIAGNOSIS — E785 Hyperlipidemia, unspecified: Secondary | ICD-10-CM

## 2020-04-20 DIAGNOSIS — E1165 Type 2 diabetes mellitus with hyperglycemia: Secondary | ICD-10-CM | POA: Diagnosis not present

## 2020-04-20 LAB — POCT GLYCOSYLATED HEMOGLOBIN (HGB A1C): Hemoglobin A1C: 6.7 % — AB (ref 4.0–5.6)

## 2020-04-20 MED ORDER — TRESIBA FLEXTOUCH 100 UNIT/ML ~~LOC~~ SOPN: 25.0000 [IU] | PEN_INJECTOR | Freq: Every day | SUBCUTANEOUS | 3 refills | Status: DC

## 2020-04-20 NOTE — Patient Instructions (Signed)
Please continue: - Invokana 100 mg in a.m. - Ozempic 0.5 mg weekly  Please increase: - Tresiba 25-28 units daily  Please come back for a follow-up appointment in 4 months

## 2020-04-20 NOTE — Addendum Note (Signed)
Addended by: Cardell Peach I on: 04/20/2020 02:06 PM   Modules accepted: Orders

## 2020-04-20 NOTE — Progress Notes (Signed)
ID: Cassandra Munoz, female   DOB: May 21, 1961, 59 y.o.   MRN: [id number]  This visit occurred during the SARS-CoV-2 public health emergency.  Safety protocols were in place, including screening questions prior to the visit, additional usage of staff PPE, and extensive cleaning of exam room while observing appropriate contact time as indicated for disinfecting solutions.   HPI: Cassandra Munoz is a 59 y.o.-year-old female, initially referred by [name] Harle Battiest), for management of DM2, 2012, insulin-dependent, uncontrolled, without long term complications. Last visit 4 months ago.  She started [place] for Psoriasis 4-5 years ago >> GI pbs >> stopped 07/2019 but GI symptoms persisted.  She then stopped Metformin >> GI sxs resolved and they restarted after trying a lower dose of Metformin (cramping, diarrhea).  She stopped the medication before last visit.  She had nausea, vomiting, eructations,since last OV >> resolved in last 2 weeks.  Reviewed HbA1c levels: Lab Results  Component Value Date   HGBA1C 6.4 (A) 11/29/2019   HGBA1C 6.6 (A) 07/26/2019   HGBA1C 6.6 11/18/2018  2012: HbA1c 7%  She is on: - Invokana-added 05/2015: 100 mg daily  - Ozempic 0.5 mg weekly-added 06/2019  - Lantus-added 03/2015: 15 >> 20 >> 25 units >> Tresiba 20-25 >> 25 units daily Prev. On Metformin ER 1000 >> 2000 mg with dinner >> decreased dose and then only taking it sporadically due to GI symptoms >>  stopped.  Pt checks her sugars 1-3 times a day: - am: 97, 106-139, 150, 159 (cookie), 197 >>  116-155, 161, 166 -- 2h after b'fast 114 >> 128 >> n/c >> 93-127 >> n/c - before lunch: 110-118 >> 119-134 >> 67, 91-135 >> n/c - 2h after lunch:   n/c >> 125-165 >> 89, 110 >> n/c - before dinner:  104-149 >> 90-126 >> 109-148 - 2h after dinner:  149, 160, 191 >> 170-204 >> n/c >> 149 - bedtime:  149-155 >> 116-139, 147, 166 >> n/c - nighttime: n/c It is unclear at which level she has hypoglycemia awareness. Highest  sugar was 204 >> 197 >> 166.  Glucometer: OneTouch  -No CKD, last BUN/creatinine:  06/15/2019: 17/0.79, glucose 130 Lab Results  Component Value Date   BUN 14 11/22/2018   Lab Results  Component Value Date   CREATININE 0.62 11/22/2018   -+ HL; last set of lipids: Lab Results  Component Value Date   CHOL 217 (H) 11/22/2018   HDL 53 11/22/2018   LDLCALC 135 (H) 11/22/2018   LDLDIRECT 124.0 08/15/2016   TRIG 147 11/22/2018   CHOLHDL 4.1 11/22/2018  She refused statins. - last eye exam was in 06/2019: No DR -No numbness and tingling in her feet.  She has a history of vitamin D deficiency (9.8 on 02/15/2015) >> she is on ergocalciferol 50,000 units twice a week-per PCP.  06/15/2019: vit D 51.5 Lab Results  Component Value Date   VD25OH 17.2 (L) 11/22/2018   VD25OH 36.35 11/23/2017   VD25OH 60.31 08/15/2016   Denies joint or bone pains. She has a history of transaminitis. Also, history of psoriasis.  Latest TSH was normal: 06/15/2019: TSH 2.2 Lab Results  Component Value Date   TSH 2.270 04/12/2018   She saw [facility] >> Food allergy test >> she was found to have multiple allergies, including to gluten, dairy, nuts, soy, etc. She started to eliminate dairy and felt better.  Works in the NICU at [employer]: Constitutional: no weight gain/no weight loss, no  fatigue, no subjective hyperthermia, no subjective hypothermia Eyes: no blurry vision, no xerophthalmia ENT: no sore throat, no nodules palpated in neck, no dysphagia, no odynophagia, no hoarseness Cardiovascular: no CP/no SOB/no palpitations/no leg swelling Respiratory: no cough/no SOB/no wheezing Gastrointestinal: + resolved N/no V/no D/no C/no acid reflux Musculoskeletal: no muscle aches/no joint aches Skin: no rashes, no hair loss Neurological: no tremors/no numbness/no tingling/no dizziness  I reviewed pt's medications, allergies, PMH, social hx, family hx, and changes were  documented in the history of present illness. Otherwise, unchanged from my initial visit note.  Past Medical History:  Diagnosis Date  . Atypical mole 02/08/2020   Left forearm. Melanocytic proliferation of uncertain malignant potential, base involved.  . Diabetes mellitus without complication ([place])   . Elevated cholesterol with elevated triglycerides   . Psoriasis   . Sciatic pain 2015  . Vitamin D deficiency    Past Surgical History:  Procedure Laterality Date  . ABDOMINAL HYSTERECTOMY N/A 05/07/2015   Procedure: HYSTERECTOMY ABDOMINAL;  Surgeon: Brayton Mars, MD;  Location: [facility] ORS;  Service: Gynecology;  Laterality: N/A;  . ANKLE ARTHROSCOPY Right   . COLONOSCOPY WITH PROPOFOL N/A 04/03/2016   Procedure: COLONOSCOPY WITH PROPOFOL;  Surgeon: Manya Silvas, MD;  Location: [facility] ENDOSCOPY;  Service: Endoscopy;  Laterality: N/A;  . ROBOTIC ASSISTED LAPAROSCOPIC VAGINAL HYSTERECTOMY WITH FIBROID REMOVAL     Fibroid removal  . TONSILLECTOMY AND ADENOIDECTOMY     History   Social History Main Topics  . Smoking status: Never Smoker   . Smokeless tobacco: Not on file  . Alcohol Use: 0.0 oz/week    0 Standard drinks or equivalent per week  . Drug Use: No  . 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 glucose blood (FREESTYLE TEST STRIPS) test strip Use to check blood sugar 2 times a day. 200 each 12  . insulin degludec (TRESIBA FLEXTOUCH) 100 UNIT/ML SOPN FlexTouch Pen Inject 0.25 mLs (25 Units total) into the skin at bedtime. 30 mL 1  . INVOKANA 100 MG TABS tablet TAKE 1 TABLET (100 MG TOTAL) BY MOUTH DAILY. 90 tablet 3  . Lancets (FREESTYLE) lancets Use to check blood sugar 2 times a day. 200 each 12  . metFORMIN (GLUCOPHAGE-XR) 500 MG 24  hr tablet TAKE 4 TABLETS BY MOUTH DAILY WITH SUPPER (Patient not taking: Reported on 01/12/2020) 360 tablet 3  . Multiple Vitamin (MULTI-VITAMINS) TABS Take by mouth.    . nystatin-triamcinolone (MYCOLOG II) cream Apply 1 application topically 2 (two) times daily. Use for 10-14 days 30 g 2  . OZEMPIC, 0.25 OR 0.5 MG/DOSE, 2 MG/1.5ML SOPN INJECT 0.5 MG INTO THE SKIN ONCE A WEEK. 1.5 mL 5  . PARoxetine (PAXIL) 10 MG tablet TAKE 1 TABLET BY MOUTH ONCE DAILY 30 tablet 11  . PARoxetine Mesylate 7.5 MG CAPS Take 1 tablet by mouth daily. (Patient not taking: Reported on 01/12/2020) 30 capsule 11  . Risankizumab-rzaa,150 MG Dose, (SKYRIZI, 150 MG DOSE,) 75 MG/0.83ML PSKT Inject 2 Syringes into the skin as directed. Inject 2 syringes into the skin as directed every 12 weeks. 2 each 3  . UNIFINE PENTIPS 32G X 4 MM MISC USE DAILY AS INSTRUCTED 100 each 2  . Vitamin D, Ergocalciferol, (  DRISDOL) 1.25 MG (50000 UNIT) CAPS capsule TAKE 1 CAPSULE BY MOUTH 2 TIMES A WEEK. 24 capsule 2   No current facility-administered medications on file prior to visit.   Allergies  Allergen Reactions  . Clobetasol Rash    Ointment used for psorasis   Family History  Problem Relation Age of Onset  . Diabetes Mother   . Hypertension Mother   . Colon cancer Father   . Heart disease Father   . Hypertension Father   . Cancer Father   . Colon cancer Sister   . Cancer Sister   . Breast cancer Paternal Aunt        times 3   PE: BP 120/82   Pulse 88   Ht 5\' 5"  (1.651 m)   Wt 255 lb (115.7 kg)   LMP 05/01/2013   SpO2 96%   BMI 42.43 kg/m  Body mass index is 42.43 kg/m. Wt Readings from Last 3 Encounters:  04/20/20 255 lb (115.7 kg)  01/12/20 257 lb 6.4 oz (116.8 kg)  11/29/19 256 lb (116.1 kg)   Constitutional: overweight, in NAD Eyes: PERRLA, EOMI, no exophthalmos ENT: moist mucous membranes, no thyromegaly, no cervical lymphadenopathy Cardiovascular: RRR, No MRG Respiratory: CTA B Gastrointestinal: abdomen  soft, NT, ND, BS+ Musculoskeletal: no deformities, strength intact in all 4 Skin: moist, warm, no rashes Neurological: no tremor with outstretched hands, DTR normal in all 4  ASSESSMENT: 1. DM2, insulin-dependent, w/o long term complications but with hyperglycemia.  2. HL  3.  Obesity class III  PLAN:  1. Patient with history of fairly well-controlled type 2 diabetes, with occasional hyperglycemic spikes due to dietary indiscretions.  HbA1c at last visit was improved, at 6.4%.  She usually has higher blood sugars in the morning, consistent with dawn phenomenon.  In the last 6 months, she started to improve her diet and eating more fruits and vegetables, and also added berberine.  Sugars improved, despite having had to stop Metformin due to GI symptoms.  After last visit, per insurance preference, we changed from Lantus to [place]. -At this visit, sugars are higher in the morning and she is not checking much later in the day but they are mostly at goal before dinner.  We discussed about increasing the dose of Tresiba slightly at least temporarily until sugars improve and then she can try to back off again. -Since last visit, she had nausea, occasional vomiting, and eructations, which resolved approximately 2 weeks ago.  It is unclear why she had these symptoms.  She has been on Ozempic since 06/2019 without problems until now.  We did discuss that for now, I would recommend to continue Ozempic especially since she is symptom-free, however, if symptoms restarted, to stop Ozempic and let me know.  She may need to have a lipase or a gallbladder ultrasound checked at that time - I suggested to:  Patient Instructions  Please continue: - Invokana 100 mg in a.m. - Ozempic 0.5 mg weekly  Please increase: - Tresiba 25-28 units daily  Please come back for a follow-up appointment in 4 months  - we checked her HbA1c: 6.7% (slightly higher) - advised to check sugars at different times of the day - 1x a  day, rotating check times - advised for yearly eye exams >> she is UTD - return to clinic in 4 months  2. HL -Reviewed latest lipid panel from 10/2018: LDL above target, the rest of the fractions at goal: Lab Results  Component Value Date  CHOL 217 (H) 11/22/2018   HDL 53 11/22/2018   LDLCALC 135 (H) 11/22/2018   LDLDIRECT 124.0 08/15/2016   TRIG 147 11/22/2018   CHOLHDL 4.1 11/22/2018  -She refused statins repeatedly in the past -She is due for another lipid panel -we will have this fasting per PCP  3.  Obesity class III -Continue Ozempic which should also help with weight loss -No significant weight loss since last visit  Philemon Kingdom, MD PhD [facility]

## 2020-06-14 MED FILL — SKYRIZI (150 MG DOSE) 75 MG: 75 | 84 days supply | Qty: 1 | Fill #2

## 2020-08-09 ENCOUNTER — Ambulatory Visit: Payer: 59 | Admitting: Dermatology

## 2020-08-13 ENCOUNTER — Other Ambulatory Visit: Payer: Self-pay | Admitting: Internal Medicine

## 2020-08-14 ENCOUNTER — Other Ambulatory Visit: Payer: Self-pay | Admitting: Internal Medicine

## 2020-08-14 ENCOUNTER — Telehealth: Payer: Self-pay | Admitting: Pharmacist

## 2020-08-14 NOTE — Telephone Encounter (Signed)
Called patient to schedule an appointment for the [facility] [facility]. I was unable to reach the patient so I left a HIPAA-compliant message requesting that the patient return my call.   

## 2020-08-14 NOTE — Telephone Encounter (Signed)
Okay to continue to refill?

## 2020-08-15 ENCOUNTER — Ambulatory Visit (HOSPITAL_BASED_OUTPATIENT_CLINIC_OR_DEPARTMENT_OTHER): Payer: 59 | Admitting: Pharmacist

## 2020-08-15 ENCOUNTER — Other Ambulatory Visit: Payer: Self-pay

## 2020-08-15 DIAGNOSIS — Z79899 Other long term (current) drug therapy: Secondary | ICD-10-CM

## 2020-08-15 NOTE — Progress Notes (Signed)
   S: Patient presents for review of their specialty medication therapy.  Patient is currently taking Skyrizi for psoriasis. Patient is managed by Dr. Nehemiah Massed for this.   Adherence: confirmed  Efficacy: reports that this works well for her   Dosing: Plaque psoriasis, moderate to severe: SubQ: every 12 weeks.  Screening: TB test: completed per patient  Monitoring: S/sx of infection: denies S/sx of hypersensitivity/injection site reaction: has not taken med yet.   O:     Lab Results  Component Value Date   WBC 6.5 05/02/2015   HGB 13.3 05/08/2015   HCT 42.3 05/02/2015   MCV 88.3 05/02/2015   PLT 256 05/02/2015      Chemistry      Component Value Date/Time   NA 137 11/22/2018 0911   K 4.0 11/22/2018 0911   CL 105 11/22/2018 0911   CO2 23 11/22/2018 0911   BUN 14 11/22/2018 0911   CREATININE 0.62 11/22/2018 0911   CREATININE 0.76 11/23/2017 1049      Component Value Date/Time   CALCIUM 9.0 11/22/2018 0911   ALKPHOS 45 11/22/2018 0911   AST 43 (H) 11/22/2018 0911   ALT 74 (H) 11/22/2018 0911   BILITOT 0.5 11/22/2018 0911       A/P: 1. Medication review: Patient currently on [place] for psoriasis. Reviewed the medication with the patient, including the following: [place] is a monoclonal antibody used in the treatment of psoriasis. Patient educated on purpose, proper use and potential adverse effects of Skyrizi. Possible adverse effects are infections, headache, and injection site reactions. Live vaccinations should be avoided while on therapy. SubQ: Administer the two consecutive injections subcutaneously at different anatomic locations, such as thighs, abdomen, or back of upper arms. Do not inject into areas where the skin is tender, bruised, red, hard, or affected by psoriasis. Intended for use under supervision of a health care professional; self-injection may occur after proper training (except back of upper arms). No recommendations for any changes at this  time.  Benard Halsted, PharmD, [place] [phone]

## 2020-08-17 ENCOUNTER — Ambulatory Visit: Payer: 59 | Admitting: Internal Medicine

## 2020-09-04 ENCOUNTER — Other Ambulatory Visit: Payer: Self-pay

## 2020-09-04 MED ORDER — SKYRIZI 150 MG/ML ~~LOC~~ SOSY
150.0000 mg | PREFILLED_SYRINGE | SUBCUTANEOUS | 0 refills | Status: DC
Start: 2020-09-04 — End: 2020-09-25

## 2020-09-04 NOTE — Progress Notes (Signed)
Last Skyrizi refill dosage change sent. Pt needs appt for further refills.

## 2020-09-05 DIAGNOSIS — L400 Psoriasis vulgaris: Secondary | ICD-10-CM | POA: Diagnosis not present

## 2020-09-10 MED FILL — SKYRIZI 150 MG/ML SOSY: 150 | 84 days supply | Qty: 1 | Fill #0

## 2020-09-25 ENCOUNTER — Other Ambulatory Visit: Payer: Self-pay | Admitting: Pharmacist

## 2020-09-25 MED ORDER — SKYRIZI 150 MG/ML ~~LOC~~ SOSY: 150.0000 mg | PREFILLED_SYRINGE | SUBCUTANEOUS | 0 refills | Status: DC

## 2020-09-27 ENCOUNTER — Other Ambulatory Visit: Payer: Self-pay | Admitting: Dermatology

## 2020-10-02 ENCOUNTER — Other Ambulatory Visit: Payer: Self-pay | Admitting: Internal Medicine

## 2020-10-03 ENCOUNTER — Ambulatory Visit: Payer: 59 | Admitting: Internal Medicine

## 2020-11-12 ENCOUNTER — Ambulatory Visit: Payer: 59 | Admitting: Internal Medicine

## 2020-11-23 ENCOUNTER — Encounter: Payer: Self-pay | Admitting: Internal Medicine

## 2020-11-23 ENCOUNTER — Other Ambulatory Visit: Payer: Self-pay

## 2020-11-23 ENCOUNTER — Ambulatory Visit (INDEPENDENT_AMBULATORY_CARE_PROVIDER_SITE_OTHER): Payer: 59 | Admitting: Internal Medicine

## 2020-11-23 VITALS — BP 130/82 | HR 94 | Ht 65.0 in | Wt 263.6 lb

## 2020-11-23 DIAGNOSIS — Z794 Long term (current) use of insulin: Secondary | ICD-10-CM | POA: Diagnosis not present

## 2020-11-23 DIAGNOSIS — E669 Obesity, unspecified: Secondary | ICD-10-CM | POA: Diagnosis not present

## 2020-11-23 DIAGNOSIS — E1165 Type 2 diabetes mellitus with hyperglycemia: Secondary | ICD-10-CM | POA: Diagnosis not present

## 2020-11-23 DIAGNOSIS — E785 Hyperlipidemia, unspecified: Secondary | ICD-10-CM

## 2020-11-23 LAB — COMPREHENSIVE METABOLIC PANEL
ALT: 63 U/L — ABNORMAL HIGH (ref 0–35)
AST: 34 U/L (ref 0–37)
Albumin: 4.4 g/dL (ref 3.5–5.2)
Alkaline Phosphatase: 47 U/L (ref 39–117)
BUN: 19 mg/dL (ref 6–23)
CO2: 31 mEq/L (ref 19–32)
Calcium: 10.3 mg/dL (ref 8.4–10.5)
Chloride: 102 mEq/L (ref 96–112)
Creatinine, Ser: 0.84 mg/dL (ref 0.40–1.20)
GFR: 75.98 mL/min (ref >60.00)
Glucose, Bld: 114 mg/dL — ABNORMAL HIGH (ref 70–99)
Potassium: 4.6 mEq/L (ref 3.5–5.1)
Sodium: 139 mEq/L (ref 135–145)
Total Bilirubin: 0.5 mg/dL (ref 0.2–1.2)
Total Protein: 7.8 g/dL (ref 6.0–8.3)

## 2020-11-23 LAB — POCT GLYCOSYLATED HEMOGLOBIN (HGB A1C): Hemoglobin A1C: 7.2 % — AB (ref 4.0–5.6)

## 2020-11-23 LAB — LIPID PANEL
Cholesterol: 229 mg/dL — ABNORMAL HIGH (ref 0–200)
HDL: 47 mg/dL (ref >39.00)
NonHDL: 182.38
Total CHOL/HDL Ratio: 5
Triglycerides: 213 mg/dL — ABNORMAL HIGH (ref 0.0–149.0)
VLDL: 42.6 mg/dL — ABNORMAL HIGH (ref 0.0–40.0)

## 2020-11-23 LAB — LIPASE: Lipase: 51 U/L (ref 11.0–59.0)

## 2020-11-23 LAB — MICROALBUMIN / CREATININE URINE RATIO
Creatinine,U: 80.5 mg/dL
Microalb Creat Ratio: 1.1 mg/g (ref 0.0–30.0)
Microalb, Ur: 0.9 mg/dL (ref 0.0–1.9)

## 2020-11-23 LAB — LDL CHOLESTEROL, DIRECT: Direct LDL: 147 mg/dL

## 2020-11-23 NOTE — Patient Instructions (Addendum)
Please continue: - Invokana 100 mg in a.m.  Please increase: - Tresiba 28 units daily   Change: - Ozempic 0.25 mg alternating with 0.5 mg every other week - start in 2 weeks  Please come back for a follow-up appointment in 4 months

## 2020-11-23 NOTE — Progress Notes (Signed)
ID: Cassandra Munoz, female   DOB: [birth year], 60 y.o.   MRN: [id number]  This visit occurred during the SARS-CoV-2 public health emergency.  Safety protocols were in place, including screening questions prior to the visit, additional usage of staff PPE, and extensive cleaning of exam room while observing appropriate contact time as indicated for disinfecting solutions.   HPI: Cassandra Munoz is a 60 y.o.-year-old female, initially referred by [name] Harle Battiest), for management of DM2, 2012, insulin-dependent, uncontrolled, without long term complications. Last visit 7 months ago.  She was previously on [place] for Psoriasis 5 years ago >> GI pbs >> stopped 07/2019 but GI symptoms persisted.  They improved since last visit, however, when she tried to increase Ozempic to 0.5 mg weekly, she again had nausea and had to back off the dose.  Reviewed HbA1c levels: Lab Results  Component Value Date   HGBA1C 6.7 (A) 04/20/2020   HGBA1C 6.4 (A) 11/29/2019   HGBA1C 6.6 (A) 07/26/2019  2012: HbA1c 7%  She is on: - Invokana-added 05/2015: 100 mg daily  - Ozempic 0.5 mg weekly-added 06/2019  >> now 0.25 mg for last 3 weeks  - nausea with 0.5 mg weekly - Lantus-added 03/2015: 15 >> 20 >> 25 units >> Tresiba 20-25 >> 25 >> 25units daily Prev. On Metformin ER 1000 >> 2000 mg with dinner >> decreased dose and then only taking it sporadically due to GI symptoms >>  stopped.  Pt checks her sugars 1-3 times a day: - am: 97, 106-139, 150, 159, 197 >>  116-155, 161, 166 >> 108, 116-162 -- 2h after b'fast 114 >> 128 >> n/c >> 93-127 >> n/c >> 130, 186 - before lunch: 110-118 >> 119-134 >> 67, 91-135 >> n/c - 2h after lunch:   n/c >> 125-165 >> 89, 110 >> n/c - before dinner:  104-149 >> 90-126 >> 109-148 >> 118-134 - 2h after dinner:  149, 160, 191 >> 170-204 >> n/c >> 149 >> 157, 167 - bedtime:  149-155 >> 116-139, 147, 166 >> n/c - nighttime: n/c It is unclear at which level she has hypoglycemia  awareness. Highest sugar was 204 >> 197 >> 166.  Glucometer: OneTouch  -No CKD, last BUN/creatinine:  06/15/2019: 17/0.79, glucose 130 Lab Results  Component Value Date   BUN 14 11/22/2018   Lab Results  Component Value Date   CREATININE 0.62 11/22/2018   -+ HL; last set of lipids: Lab Results  Component Value Date   CHOL 217 (H) 11/22/2018   HDL 53 11/22/2018   LDLCALC 135 (H) 11/22/2018   LDLDIRECT 124.0 08/15/2016   TRIG 147 11/22/2018   CHOLHDL 4.1 11/22/2018  She refused statins. - last eye exam was in 06/2019: No DR -Now numbness and tingling in her feet.  She has a history of vitamin D deficiency -she is on ergocalciferol 50,000 units twice a day per PCP.  Reviewed available vitamin D levels: 06/15/2019: Vitamin D 51.5 Lab Results  Component Value Date   VD25OH 17.2 (L) 11/22/2018   VD25OH 36.35 11/23/2017   VD25OH 60.31 08/15/2016  02/15/2015: Vitamin D 9.8  No joint or bone pain. She has a history of transaminitis. She also has a history of psoriasis.  Latest TSH was normal: 06/15/2019: TSH 2.2 Lab Results  Component Value Date   TSH 2.270 04/12/2018   She saw [facility] >> Food allergy test >> she was found to have multiple allergies, including to gluten, dairy, nuts, soy, etc. She started  to eliminate dairy and felt better.  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  ROS: Constitutional: + weight gain/no weight loss, no fatigue, no subjective hyperthermia, no subjective hypothermia Eyes: no blurry vision, no xerophthalmia ENT: no sore throat, no nodules palpated in neck, no dysphagia, no odynophagia, no hoarseness Cardiovascular: no CP/no SOB/no palpitations/no leg swelling Respiratory: no cough/no SOB/no wheezing Gastrointestinal: + N/no V/no D/no C/no acid reflux Musculoskeletal: no muscle aches/no joint aches Skin: no rashes, no hair loss Neurological: no tremors/no numbness/no tingling/no dizziness  I reviewed pt's medications,  allergies, PMH, social hx, family hx, and changes were documented in the history of present illness. Otherwise, unchanged from my initial visit note.  Past Medical History:  Diagnosis Date  . Atypical mole 02/08/2020   Left forearm. Melanocytic proliferation of uncertain malignant potential, base involved.  . Diabetes mellitus without complication ([place])   . Elevated cholesterol with elevated triglycerides   . Psoriasis   . Sciatic pain 2015  . Vitamin D deficiency    Past Surgical History:  Procedure Laterality Date  . ABDOMINAL HYSTERECTOMY N/A 05/07/2015   Procedure: HYSTERECTOMY ABDOMINAL;  Surgeon: Brayton Mars, MD;  Location: [facility] ORS;  Service: Gynecology;  Laterality: N/A;  . ANKLE ARTHROSCOPY Right   . COLONOSCOPY WITH PROPOFOL N/A 04/03/2016   Procedure: COLONOSCOPY WITH PROPOFOL;  Surgeon: Manya Silvas, MD;  Location: [facility] ENDOSCOPY;  Service: Endoscopy;  Laterality: N/A;  . ROBOTIC ASSISTED LAPAROSCOPIC VAGINAL HYSTERECTOMY WITH FIBROID REMOVAL     Fibroid removal  . TONSILLECTOMY AND ADENOIDECTOMY     History   Social History Main Topics  . Smoking status: Never Smoker   . Smokeless tobacco: Not on file  . Alcohol Use: 0.0 oz/week    0 Standard drinks or equivalent per week  . Drug Use: No  . 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 glucose blood (FREESTYLE TEST STRIPS) test strip Use to check blood sugar 2 times a day. 200 each 12  . INVOKANA 100 MG TABS tablet TAKE 1 TABLET (100 MG TOTAL) BY MOUTH DAILY. 90 tablet 3  . Lancets (FREESTYLE) lancets Use to check blood sugar 2 times a day. 200 each 12  . Multiple Vitamin (MULTI-VITAMINS) TABS Take by mouth.    . nystatin-triamcinolone (MYCOLOG II) cream Apply 1  application topically 2 (two) times daily. Use for 10-14 days 30 g 2  . OZEMPIC, 0.25 OR 0.5 MG/DOSE, 2 MG/1.5ML SOPN INJECT 0.5 MG INTO THE SKIN ONCE A WEEK. 1.5 mL 5  . PARoxetine (PAXIL) 10 MG tablet TAKE 1 TABLET BY MOUTH ONCE DAILY 30 tablet 11  . PARoxetine Mesylate 7.5 MG CAPS Take 1 tablet by mouth daily. (Patient not taking: Reported on 01/12/2020) 30 capsule 11  . Risankizumab-rzaa (SKYRIZI) 150 MG/ML SOSY Inject 150 mg into the skin as directed. Every 12 weeks for maintenance. 1 mL 0  . TRESIBA FLEXTOUCH 100 UNIT/ML FlexTouch Pen INJECT 25 UNITS INTO THE SKIN AT BEDTIME. 15 mL 0  . UNIFINE PENTIPS 32G X 4 MM MISC USE DAILY AS INSTRUCTED 100 each 2  . Vitamin D, Ergocalciferol, (DRISDOL) 1.25 MG (50000 UNIT) CAPS capsule TAKE 1 CAPSULE BY MOUTH 2 TIMES A WEEK. 24 capsule 1  No current facility-administered medications on file prior to visit.   Allergies  Allergen Reactions  . Clobetasol Rash    Ointment used for psorasis   Family History  Problem Relation Age of Onset  . Diabetes Mother   . Hypertension Mother   . Colon cancer Father   . Heart disease Father   . Hypertension Father   . Cancer Father   . Colon cancer Sister   . Cancer Sister   . Breast cancer Paternal Aunt        times 3   PE: BP 130/82   Pulse 94   Ht 5\' 5"  (1.651 m)   Wt 263 lb 9.6 oz (119.6 kg)   LMP 05/01/2013   SpO2 97%   BMI 43.87 kg/m  Body mass index is 43.87 kg/m. Wt Readings from Last 3 Encounters:  11/23/20 263 lb 9.6 oz (119.6 kg)  04/20/20 255 lb (115.7 kg)  01/12/20 257 lb 6.4 oz (116.8 kg)   Constitutional: overweight, in NAD Eyes: PERRLA, EOMI, no exophthalmos ENT: moist mucous membranes, no thyromegaly, no cervical lymphadenopathy Cardiovascular: tachycardia, RR, No MRG Respiratory: CTA B Gastrointestinal: abdomen soft, NT, ND, BS+ Musculoskeletal: no deformities, strength intact in all 4 Skin: moist, warm, no rashes Neurological: no tremor with outstretched hands, DTR  normal in all 4  ASSESSMENT: 1. DM2, insulin-dependent, w/o long term complications but with hyperglycemia.  2. HL  3.  Obesity class III  PLAN:  1. Patient with history of fairly well-controlled type 2 diabetes, with occasional hyperglycemic spikes due to dietary indiscretions.  Latest HbA1c checked at last visit was higher, at 6.7%.  At that time, I advised her to increase the [place] dose.  Before last visit, she had nausea, occasional vomiting, and eructations, but these resolved 2 weeks before our last visit.  However, I did not recommend an increase in the Ozempic dose due to this history.  Of note, she has been on Ozempic since 06/2019 without problems until then. -At this visit, she admits to dietary indiscretions during the holidays and noticed that her sugars are higher.  She tells me that she had to back off Ozempic from 0.5 to 0.25 mg weekly due to nausea.  She is continuing on the low-dose now, and this is tolerated well.  At this visit, we will check a lipase and if elevated, will need to stop Ozempic.  However, if normal, we discussed about possibly trying to alternate 0.25 and 0.5 mg of Ozempic every other week, if tolerated. -She did not increase Tresiba dose since last visit so I advised her to increase it now to 28 units daily.  We can continue the same dose of Invokana.  We discussed about the need to improve diet. - I suggested to:  Patient Instructions  Please continue: - Invokana 100 mg in a.m.  Please increase: - Tresiba 28 units daily   Change: - Ozempic 0.25 mg alternating with 0.5 mg every other week - start in 2 weeks  Please come back for a follow-up appointment in 4 months  - we checked her HbA1c: 7.2% (higher) - advised to check sugars at different times of the day - 1-2x a day, rotating check times - advised for yearly eye exams >> she is not UTD but plans to schedule this - return to clinic in 4 months  2. HL -Reviewed latest lipid panel from 10/2018:  LDL above target: Lab Results  Component Value Date   CHOL 217 (H) 11/22/2018  HDL 53 11/22/2018   LDLCALC 135 (H) 11/22/2018   LDLDIRECT 124.0 08/15/2016   TRIG 147 11/22/2018   CHOLHDL 4.1 11/22/2018  -She refused statins repeatedly in the past -We will repeat her lipids today (nonfasting)  3.  Obesity class III -continue SGLT inhibitor and low-dose GLP-1 receptor agonist which should also help with weight loss -she had no significant weight loss before last visit, now gained 8 lbs  Component     Latest Ref Rng & Units 11/23/2020  Sodium     135 - 145 mEq/L 139  Potassium     3.5 - 5.1 mEq/L 4.6  Chloride     96 - 112 mEq/L 102  CO2     19 - 32 mEq/L 31  Glucose     70 - 99 mg/dL 114 (H)  BUN     6 - 23 mg/dL 19  Creatinine     0.40 - 1.20 mg/dL 0.84  Total Bilirubin     0.2 - 1.2 mg/dL 0.5  Alkaline Phosphatase     39 - 117 U/L 47  AST     0 - 37 U/L 34  ALT     0 - 35 U/L 63 (H)  Total Protein     6.0 - 8.3 g/dL 7.8  Albumin     3.5 - 5.2 g/dL 4.4  GFR     >60.00 mL/min 75.98  Calcium     8.4 - 10.5 mg/dL 10.3  Cholesterol     0 - 200 mg/dL 229 (H)  Triglycerides     0.0 - 149.0 mg/dL 213.0 (H)  HDL Cholesterol     >39.00 mg/dL 47.00  VLDL     0.0 - 40.0 mg/dL 42.6 (H)  Total CHOL/HDL Ratio      5  NonHDL      182.38  Microalb, Ur     0.0 - 1.9 mg/dL 0.9  Creatinine,U     mg/dL 80.5  MICROALB/CREAT RATIO     0.0 - 30.0 mg/g 1.1  Hemoglobin A1C     4.0 - 5.6 % 7.2 (A)  Direct LDL     mg/dL 147.0  Lipase     11.0 - 59.0 U/L 51.0   Lipase is normal. LDL is above target, at 147.  Will again suggest a statin, even taken once a week.  ALT still elevated, but improved.  Philemon Kingdom, MD PhD [facility] Endocrinology

## 2020-11-27 ENCOUNTER — Other Ambulatory Visit: Payer: Self-pay | Admitting: Dermatology

## 2020-11-30 ENCOUNTER — Other Ambulatory Visit: Payer: Self-pay | Admitting: Pharmacist

## 2020-11-30 MED ORDER — SKYRIZI 150 MG/ML ~~LOC~~ SOSY: 150.0000 mg | PREFILLED_SYRINGE | SUBCUTANEOUS | 3 refills | Status: DC

## 2020-11-30 MED FILL — SKYRIZI 150 MG/ML SOSY: 150 | 84 days supply | Qty: 1 | Fill #0

## 2020-12-03 ENCOUNTER — Other Ambulatory Visit: Payer: Self-pay | Admitting: Internal Medicine

## 2020-12-03 DIAGNOSIS — M5459 Other low back pain: Secondary | ICD-10-CM | POA: Diagnosis not present

## 2020-12-03 DIAGNOSIS — M9902 Segmental and somatic dysfunction of thoracic region: Secondary | ICD-10-CM | POA: Diagnosis not present

## 2020-12-03 DIAGNOSIS — M9901 Segmental and somatic dysfunction of cervical region: Secondary | ICD-10-CM | POA: Diagnosis not present

## 2020-12-03 DIAGNOSIS — M9903 Segmental and somatic dysfunction of lumbar region: Secondary | ICD-10-CM | POA: Diagnosis not present

## 2020-12-17 ENCOUNTER — Other Ambulatory Visit: Payer: Self-pay | Admitting: Internal Medicine

## 2020-12-26 DIAGNOSIS — M654 Radial styloid tenosynovitis [de Quervain]: Secondary | ICD-10-CM | POA: Diagnosis not present

## 2021-01-16 ENCOUNTER — Encounter: Payer: 59 | Admitting: Obstetrics and Gynecology

## 2021-01-23 DIAGNOSIS — M25521 Pain in right elbow: Secondary | ICD-10-CM | POA: Diagnosis not present

## 2021-01-23 DIAGNOSIS — M25561 Pain in right knee: Secondary | ICD-10-CM | POA: Diagnosis not present

## 2021-01-24 ENCOUNTER — Other Ambulatory Visit (HOSPITAL_COMMUNITY): Payer: Self-pay

## 2021-01-28 ENCOUNTER — Other Ambulatory Visit: Payer: Self-pay

## 2021-01-28 ENCOUNTER — Other Ambulatory Visit: Payer: Self-pay | Admitting: Internal Medicine

## 2021-01-28 MED ORDER — UNIFINE PENTIPS 32G X 4 MM MISC
1 refills | Status: DC
  Filled 2021-01-28: qty 100, 90d supply, fill #0
  Filled 2021-05-14: qty 100, 90d supply, fill #1

## 2021-01-28 MED ORDER — CANAGLIFLOZIN 100 MG PO TABS
ORAL_TABLET | Freq: Every day | ORAL | 3 refills | Status: DC
  Filled 2021-01-28: qty 30, 30d supply, fill #0
  Filled 2021-02-26: qty 30, 30d supply, fill #1
  Filled 2021-03-27: qty 30, 30d supply, fill #2
  Filled 2021-04-25: qty 30, 30d supply, fill #3
  Filled 2021-05-26: qty 30, 30d supply, fill #4
  Filled 2021-06-25: qty 30, 30d supply, fill #5

## 2021-01-29 ENCOUNTER — Other Ambulatory Visit: Payer: Self-pay

## 2021-01-30 ENCOUNTER — Other Ambulatory Visit: Payer: Self-pay

## 2021-01-30 ENCOUNTER — Other Ambulatory Visit: Payer: Self-pay | Admitting: Internal Medicine

## 2021-01-31 ENCOUNTER — Encounter: Payer: Self-pay | Admitting: Obstetrics and Gynecology

## 2021-01-31 ENCOUNTER — Other Ambulatory Visit: Payer: Self-pay

## 2021-01-31 MED ORDER — TRESIBA FLEXTOUCH 100 UNIT/ML ~~LOC~~ SOPN
28.0000 [IU] | PEN_INJECTOR | Freq: Every day | SUBCUTANEOUS | 0 refills | Status: DC
  Filled 2021-01-31: qty 15, 53d supply, fill #0

## 2021-02-19 ENCOUNTER — Other Ambulatory Visit (HOSPITAL_COMMUNITY): Payer: Self-pay

## 2021-02-19 MED FILL — Risankizumab-rzaa Soln Prefilled Syringe 150 MG/ML: SUBCUTANEOUS | 84 days supply | Qty: 1 | Fill #0 | Status: AC

## 2021-02-22 ENCOUNTER — Other Ambulatory Visit (HOSPITAL_COMMUNITY): Payer: Self-pay

## 2021-02-25 ENCOUNTER — Other Ambulatory Visit (HOSPITAL_COMMUNITY): Payer: Self-pay

## 2021-02-26 ENCOUNTER — Other Ambulatory Visit: Payer: Self-pay

## 2021-03-06 DIAGNOSIS — L908 Other atrophic disorders of skin: Secondary | ICD-10-CM | POA: Diagnosis not present

## 2021-03-06 DIAGNOSIS — L738 Other specified follicular disorders: Secondary | ICD-10-CM | POA: Diagnosis not present

## 2021-03-06 DIAGNOSIS — L400 Psoriasis vulgaris: Secondary | ICD-10-CM | POA: Diagnosis not present

## 2021-03-06 DIAGNOSIS — D2262 Melanocytic nevi of left upper limb, including shoulder: Secondary | ICD-10-CM | POA: Diagnosis not present

## 2021-03-06 DIAGNOSIS — D225 Melanocytic nevi of trunk: Secondary | ICD-10-CM | POA: Diagnosis not present

## 2021-03-06 DIAGNOSIS — D2261 Melanocytic nevi of right upper limb, including shoulder: Secondary | ICD-10-CM | POA: Diagnosis not present

## 2021-03-06 DIAGNOSIS — D2272 Melanocytic nevi of left lower limb, including hip: Secondary | ICD-10-CM | POA: Diagnosis not present

## 2021-03-06 DIAGNOSIS — D2271 Melanocytic nevi of right lower limb, including hip: Secondary | ICD-10-CM | POA: Diagnosis not present

## 2021-03-11 ENCOUNTER — Other Ambulatory Visit: Payer: Self-pay

## 2021-03-11 ENCOUNTER — Other Ambulatory Visit: Payer: Self-pay | Admitting: Internal Medicine

## 2021-03-11 DIAGNOSIS — M25521 Pain in right elbow: Secondary | ICD-10-CM | POA: Diagnosis not present

## 2021-03-11 MED ORDER — METHYLPREDNISOLONE 4 MG PO TBPK
ORAL_TABLET | ORAL | 0 refills | Status: DC
  Filled 2021-03-11: qty 21, 6d supply, fill #0

## 2021-03-11 MED FILL — Ergocalciferol Cap 1.25 MG (50000 Unit): ORAL | 84 days supply | Qty: 24 | Fill #0 | Status: AC

## 2021-03-12 ENCOUNTER — Other Ambulatory Visit: Payer: Self-pay

## 2021-03-12 MED FILL — Semaglutide Soln Pen-inj 0.25 or 0.5 MG/DOSE (2 MG/1.5ML): SUBCUTANEOUS | 84 days supply | Qty: 4.5 | Fill #0 | Status: AC

## 2021-03-27 ENCOUNTER — Other Ambulatory Visit: Payer: Self-pay

## 2021-03-27 ENCOUNTER — Other Ambulatory Visit: Payer: Self-pay | Admitting: Internal Medicine

## 2021-03-27 MED FILL — Insulin Degludec Soln Pen-Injector 100 Unit/ML: SUBCUTANEOUS | 53 days supply | Qty: 15 | Fill #0 | Status: AC

## 2021-03-28 ENCOUNTER — Other Ambulatory Visit: Payer: Self-pay

## 2021-03-28 ENCOUNTER — Ambulatory Visit: Payer: 59 | Admitting: Internal Medicine

## 2021-04-09 ENCOUNTER — Ambulatory Visit: Payer: 59 | Admitting: Internal Medicine

## 2021-04-09 ENCOUNTER — Other Ambulatory Visit: Payer: Self-pay

## 2021-04-09 ENCOUNTER — Encounter: Payer: Self-pay | Admitting: Internal Medicine

## 2021-04-09 VITALS — BP 138/78 | HR 86 | Ht 65.0 in | Wt 266.6 lb

## 2021-04-09 DIAGNOSIS — Z794 Long term (current) use of insulin: Secondary | ICD-10-CM

## 2021-04-09 DIAGNOSIS — E785 Hyperlipidemia, unspecified: Secondary | ICD-10-CM | POA: Diagnosis not present

## 2021-04-09 DIAGNOSIS — E669 Obesity, unspecified: Secondary | ICD-10-CM

## 2021-04-09 DIAGNOSIS — E1165 Type 2 diabetes mellitus with hyperglycemia: Secondary | ICD-10-CM

## 2021-04-09 LAB — POCT GLYCOSYLATED HEMOGLOBIN (HGB A1C): Hemoglobin A1C: 7.1 % — AB (ref 4.0–5.6)

## 2021-04-09 MED ORDER — EZETIMIBE 10 MG PO TABS
10.0000 mg | ORAL_TABLET | Freq: Every day | ORAL | 3 refills | Status: DC
  Filled 2021-04-09: qty 90, 90d supply, fill #0
  Filled 2021-07-01: qty 90, 90d supply, fill #1
  Filled 2021-09-30: qty 90, 90d supply, fill #2
  Filled 2022-01-06: qty 90, 90d supply, fill #3

## 2021-04-09 NOTE — Patient Instructions (Addendum)
Please continue: - Invokana 100 mg in a.m. - Tresiba 28 units daily   Please increase: - Ozempic 0.75 mg weekly and try 1 mg after few weeks  Start: - Ezetimibe 10 mg daily  Please come back for a follow-up appointment in 4 months.

## 2021-04-09 NOTE — Addendum Note (Signed)
Addended by: Lauralyn Primes on: 04/09/2021 04:38 PM   Modules accepted: Orders

## 2021-04-09 NOTE — Progress Notes (Signed)
ID: Cassandra Munoz, female   DOB: 1960/11/21, 60 y.o.   MRN: [id number]  This visit occurred during the SARS-CoV-2 public health emergency.  Safety protocols were in place, including screening questions prior to the visit, additional usage of staff PPE, and extensive cleaning of exam room while observing appropriate contact time as indicated for disinfecting solutions.   HPI: Cassandra Munoz is a 60 y.o.-year-old female, initially referred by [name] Harle Battiest), for management of DM2, 2012, insulin-dependent, uncontrolled, without long term complications. Last visit 4.5 months ago.  Interim history: She was previously on [place] for Psoriasis 5 years ago >> GI pbs >> stopped 07/2019 but GI symptoms persisted.  They improved since last visit, however, when she tried to increase Ozempic to 0.5 mg weekly, she again had nausea and had to back off the dose. Now on Skyrizi - every 12 weeks. She had 2 inj's with steroids (wrist and knee) and a Prednisone taper for back pain. Sugars higher. No plans for further steroid use. She also sees a Restaurant manager, fast food.  Reviewed HbA1c levels: Lab Results  Component Value Date   HGBA1C 7.2 (A) 11/23/2020   HGBA1C 6.7 (A) 04/20/2020   HGBA1C 6.4 (A) 11/29/2019  2012: HbA1c 7%  She is on: - Invokana-added 05/2015: 100 mg daily  - Ozempic 0.5 mg weekly-added 06/2019  >> 0.25 mg alternating with 0.5 mg every other week >> 0.5 mg  - Lantus-added 03/2015: 15 >> 20 >> 25 units >> Tresiba 20-25 >> 25 >> 28 units daily Prev. On Metformin ER 1000 >> 2000 mg with dinner >> decreased dose and then only taking it sporadically due to GI symptoms >>  stopped.  Pt checks her sugars 1-3 times a day: - am: 116-155, 161, 166 >> 108, 116-162 >> 110, 118-160, 169, 220 - 2h after b'fast 128 >> n/c >> 93-127 >> n/c >> 130, 186 >> n/c - before lunch: 110-118 >> 119-134 >> 67, 91-135 >> n/c - 2h after lunch:   n/c >> 125-165 >> 89, 110 >> n/c - before dinner: 90-126 >> 109-148 >>  118-134 >> 121-136, 155 - 2h after dinner:   170-204 >> n/c >> 149 >> 157, 167 >> n/c - bedtime:  149-155 >> 116-139, 147, 166 >> n/c - nighttime: n/c It is unclear at which level she has hypoglycemia awareness. Highest sugar was 204 >> 197 >> 166 >> 220.  Glucometer: OneTouch  -No CKD, last BUN/creatinine:  Lab Results  Component Value Date   BUN 19 11/23/2020   Lab Results  Component Value Date   CREATININE 0.84 11/23/2020   -+ HL; last set of lipids: Lab Results  Component Value Date   CHOL 229 (H) 11/23/2020   HDL 47.00 11/23/2020   LDLCALC 135 (H) 11/22/2018   LDLDIRECT 147.0 11/23/2020   TRIG 213.0 (H) 11/23/2020   CHOLHDL 5 11/23/2020  She repeatedly refused statins.  - last eye exam was in 2021: reportedly No DR. Next will be next week.  -Now numbness and tingling in her feet.  She has a history of vitamin D deficiency -she is on ergocalciferol 50,000 units twice a day per PCP.  Reviewed available vitamin D levels: 06/15/2019: Vitamin D 51.5 Lab Results  Component Value Date   VD25OH 17.2 (L) 11/22/2018   VD25OH 36.35 11/23/2017   VD25OH 60.31 08/15/2016  02/15/2015: Vitamin D 9.8  She has a history of transaminitis. She also has a history of psoriasis.  Previously on [place], but this caused GI symptoms.  Now on [employer].   Latest TSH was normal: 06/15/2019: TSH 2.2 Lab Results  Component Value Date   TSH 2.270 04/12/2018   She saw [facility] >> Food allergy test >> she was found to have multiple allergies, including to gluten, dairy, nuts, soy, etc. She started to eliminate dairy and felt better.  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  ROS: Constitutional: + weight gain/no weight loss, no fatigue, no subjective hyperthermia, no subjective hypothermia Eyes: no blurry vision, no xerophthalmia ENT: no sore throat, no nodules palpated in neck, no dysphagia, no odynophagia, no hoarseness Cardiovascular: no CP/no SOB/no palpitations/no leg  swelling Respiratory: no cough/no SOB/no wheezing Gastrointestinal: no N/no V/no D/no C/no acid reflux Musculoskeletal: no muscle aches/no joint aches Skin: no rashes, no hair loss Neurological: no tremors/no numbness/no tingling/no dizziness  I reviewed pt's medications, allergies, PMH, social hx, family hx, and changes were documented in the history of present illness. Otherwise, unchanged from my initial visit note.  Past Medical History:  Diagnosis Date   Atypical mole 02/08/2020   Left forearm. Melanocytic proliferation of uncertain malignant potential, base involved.   Diabetes mellitus without complication ([facility])    Elevated cholesterol with elevated triglycerides    Psoriasis    Sciatic pain 2015   Vitamin D deficiency    Past Surgical History:  Procedure Laterality Date   ABDOMINAL HYSTERECTOMY N/A 05/07/2015   Procedure: HYSTERECTOMY ABDOMINAL;  Surgeon: Brayton Mars, MD;  Location: [facility] ORS;  Service: Gynecology;  Laterality: N/A;   ANKLE ARTHROSCOPY Right    COLONOSCOPY WITH PROPOFOL N/A 04/03/2016   Procedure: COLONOSCOPY WITH PROPOFOL;  Surgeon: Manya Silvas, MD;  Location: [facility] ENDOSCOPY;  Service: Endoscopy;  Laterality: N/A;   ROBOTIC ASSISTED LAPAROSCOPIC VAGINAL HYSTERECTOMY WITH FIBROID REMOVAL     Fibroid removal   TONSILLECTOMY AND ADENOIDECTOMY     History   Social History Main Topics   Smoking status: Never Smoker    Smokeless tobacco: Not on file   Alcohol Use: 0.0 oz/week    0 Standard drinks or equivalent per week   Drug Use: No   Sexual Activity: Yes   Social History Narrative   [facility]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 90 tablet 3   glucose blood test strip USE  TO CHECK BLOOD SUGAR 2 TIMES A DAY. 200 strip 12   Insulin Pen Needle (UNIFINE PENTIPS) 32G X 4 MM MISC USE DAILY AS INSTRUCTED 100 each 1   Lancets (FREESTYLE) lancets Use to check blood sugar 2 times a day. 200 each 12   methylPREDNISolone (MEDROL DOSEPAK) 4 MG TBPK tablet Take 6 pills first day, 5 pills second day, 4 pills third day, 3 pills fourth day, 2 pills fifth day, 1 pill 6th day 21 tablet 0   Multiple Vitamin (MULTI-VITAMINS) TABS Take by mouth.     nystatin-triamcinolone (MYCOLOG II) cream Apply 1 application topically 2 (two) times daily. Use for 10-14 days 30 g 2   PARoxetine (PAXIL) 10 MG tablet TAKE 1 TABLET BY MOUTH ONCE DAILY 30 tablet 11   PARoxetine Mesylate 7.5 MG CAPS Take 1 tablet by mouth daily. 30 capsule 11   Risankizumab-rzaa 150 MG/ML SOSY INJECT  150 MG INTO THE SKIN AS DIRECTED. EVERY 12 WEEKS FOR MAINTENANCE. 1 mL 3   Semaglutide,0.25 or 0.5MG /DOS, 2 MG/1.5ML SOPN INJECT 0.5 MG INTO THE SKIN ONCE A WEEK. 1.5 mL 5   insulin degludec (TRESIBA FLEXTOUCH) 100 UNIT/ML FlexTouch Pen Inject 28 Units into the skin daily. 15 mL 1   triamcinolone (KENALOG) 0.147 MG/GM topical spray APPLY TO THE AFFECTED AREA(S) ONCE OR TWICE DAILY UNTIL CLEAR, THEN USE AS NEEDED 100 g 3   Vitamin D, Ergocalciferol, (DRISDOL) 1.25 MG (50000 UNIT) CAPS capsule TAKE 1 CAPSULE BY MOUTH 2 TIMES A WEEK. 24 capsule 1   No current facility-administered medications on file prior to visit.   Allergies  Allergen Reactions   Clobetasol Rash    Ointment used for psorasis   Family History  Problem Relation Age of Onset   Diabetes Mother    Hypertension Mother    Colon cancer Father    Heart disease Father    Hypertension Father    Cancer Father    Colon cancer Sister    Cancer Sister    Breast cancer Paternal Aunt        times 3   PE: BP 138/78 (BP Location: Right Arm, Patient Position: Sitting, Cuff Size: Normal)   Pulse 86   Ht 5\' 5"  (1.651 m)   Wt 266 lb 9.6 oz (120.9 kg)   LMP  05/01/2013   SpO2 97%   BMI 44.36 kg/m  Body mass index is 44.36 kg/m. Wt Readings from Last 3 Encounters:  04/09/21 266 lb 9.6 oz (120.9 kg)  11/23/20 263 lb 9.6 oz (119.6 kg)  04/20/20 255 lb (115.7 kg)   Constitutional: overweight, in NAD Eyes: PERRLA, EOMI, no exophthalmos ENT: moist mucous membranes, no thyromegaly, no cervical lymphadenopathy Cardiovascular: tachycardia, RR, No MRG Respiratory: CTA B Gastrointestinal: abdomen soft, NT, ND, BS+ Musculoskeletal: no deformities, strength intact in all 4 Skin: moist, warm, no rashes Neurological: no tremor with outstretched hands, DTR normal in all 4  ASSESSMENT: 1. DM2, insulin-dependent, w/o long term complications but with hyperglycemia.  2. HL  3.  Obesity class III  PLAN:  1. Patient with history of well-controlled type 2 diabetes, with occasional hyperglycemic spikes, due to dietary indiscretions.  She continues on an SGLT2 inhibitor, long-acting insulin and weekly GLP-1 receptor agonist.  In the past, she had nausea, occasional vomiting, and eructations, but these improved.  However, at last visit, she decreased the Ozempic dose from 0.5 to 0.25 mg weekly due to nausea.  We checked a lipase at last visit and this was normal.  I advised her to try to alternate 0.25 with 0.5 mg of Ozempic every other week.  I also suggested an increase in [place] dose and discussed about improving diet.  HbA1c at that time was higher, at 7.2%. -At this visit, sugars are still higher than goal in the morning but better before dinner. -She tells me that she was able to increase the Ozempic dose to 0.5 mg weekly however, in the weeks that she takes Skyrizi (every 3 months), she takes 0.25 mg to avoid nausea.  This regimen works well for her. -We discussed about trying to increase the Ozempic dose further, initially to 0.75 mg weekly and then 1 mg after few weeks if she tolerates this dose well.  She needs to let me know when she runs out of the  lower dose of Ozempic so we can call the 1 mg dose in. - I suggested to:  Patient Instructions  Please continue: - Invokana 100 mg in a.m. - Tresiba 28 units daily   Please increase: - Ozempic 0.75 mg weekly and try 1 mg after few weeks  Start: - Ezetimibe 10 mg daily  Please come back for a follow-up appointment in 4 months.  - we checked her HbA1c: 7.1% (slightly lower) - advised to check sugars at different times of the day - 1-2x a day, rotating check times - advised for yearly eye exams >> she has an appointment coming up - return to clinic in 4 months  2. HL -Reviewed latest lipid panel from last visit: LDL higher, triglycerides also high: Lab Results  Component Value Date   CHOL 229 (H) 11/23/2020   HDL 47.00 11/23/2020   LDLCALC 135 (H) 11/22/2018   LDLDIRECT 147.0 11/23/2020   TRIG 213.0 (H) 11/23/2020   CHOLHDL 5 11/23/2020  -At last visit I suggested a statin again.  She did not start.  She refuses to try these for fear of short-term memory loss. -However, at this visit, she agrees to start ezetimibe 10 mg daily.   3.  Obesity class III -continue SGLT 2 inhibitor and GLP-1 receptor agonist which should also help with weight loss.  We will increase the Ozempic dose at this visit. -She gained 6 pounds before last visit and 3 more since then   Philemon Kingdom, MD PhD [facility] Endocrin[facility]

## 2021-04-22 ENCOUNTER — Other Ambulatory Visit: Payer: Self-pay

## 2021-04-22 MED FILL — Triamcinolone Acetonide Aerosol Soln 0.147 MG/GM: CUTANEOUS | 30 days supply | Qty: 100 | Fill #0 | Status: AC

## 2021-04-23 ENCOUNTER — Other Ambulatory Visit: Payer: Self-pay

## 2021-04-24 ENCOUNTER — Other Ambulatory Visit: Payer: Self-pay

## 2021-04-25 ENCOUNTER — Other Ambulatory Visit: Payer: Self-pay

## 2021-04-25 DIAGNOSIS — H524 Presbyopia: Secondary | ICD-10-CM | POA: Diagnosis not present

## 2021-05-14 ENCOUNTER — Other Ambulatory Visit (HOSPITAL_COMMUNITY): Payer: Self-pay

## 2021-05-15 ENCOUNTER — Other Ambulatory Visit: Payer: Self-pay

## 2021-05-16 ENCOUNTER — Other Ambulatory Visit (HOSPITAL_COMMUNITY): Payer: Self-pay

## 2021-05-16 MED FILL — Risankizumab-rzaa Soln Prefilled Syringe 150 MG/ML: SUBCUTANEOUS | 84 days supply | Qty: 1 | Fill #1 | Status: CN

## 2021-05-20 ENCOUNTER — Other Ambulatory Visit (HOSPITAL_COMMUNITY): Payer: Self-pay

## 2021-05-21 ENCOUNTER — Other Ambulatory Visit: Payer: Self-pay

## 2021-05-21 MED FILL — Insulin Degludec Soln Pen-Injector 100 Unit/ML: SUBCUTANEOUS | 53 days supply | Qty: 15 | Fill #1 | Status: AC

## 2021-05-22 ENCOUNTER — Other Ambulatory Visit (HOSPITAL_COMMUNITY): Payer: Self-pay

## 2021-05-22 MED FILL — Risankizumab-rzaa Soln Prefilled Syringe 150 MG/ML: SUBCUTANEOUS | 28 days supply | Qty: 1 | Fill #1 | Status: CN

## 2021-05-23 ENCOUNTER — Other Ambulatory Visit: Payer: Self-pay | Admitting: Pharmacist

## 2021-05-23 ENCOUNTER — Other Ambulatory Visit (HOSPITAL_COMMUNITY): Payer: Self-pay

## 2021-05-23 MED ORDER — SKYRIZI PEN 150 MG/ML ~~LOC~~ SOAJ
SUBCUTANEOUS | 4 refills | Status: DC
Start: 2021-05-23 — End: 2021-05-27
  Filled 2021-05-23: qty 1, fill #0
  Filled 2021-05-25 – 2021-05-27 (×3): qty 1, 84d supply, fill #0

## 2021-05-23 MED ORDER — SKYRIZI PEN 150 MG/ML ~~LOC~~ SOAJ: SUBCUTANEOUS | 4 refills | Status: DC

## 2021-05-25 ENCOUNTER — Other Ambulatory Visit (HOSPITAL_COMMUNITY): Payer: Self-pay

## 2021-05-26 MED FILL — Semaglutide Soln Pen-inj 0.25 or 0.5 MG/DOSE (2 MG/1.5ML): SUBCUTANEOUS | 56 days supply | Qty: 3 | Fill #1 | Status: AC

## 2021-05-27 ENCOUNTER — Other Ambulatory Visit: Payer: Self-pay

## 2021-05-27 ENCOUNTER — Other Ambulatory Visit: Payer: Self-pay | Admitting: Pharmacist

## 2021-05-27 ENCOUNTER — Other Ambulatory Visit (HOSPITAL_COMMUNITY): Payer: Self-pay

## 2021-05-27 MED ORDER — SKYRIZI 150 MG/ML ~~LOC~~ SOSY
PREFILLED_SYRINGE | SUBCUTANEOUS | 4 refills | Status: DC
  Filled 2021-05-27: qty 1, 84d supply, fill #0

## 2021-05-27 MED ORDER — SKYRIZI 150 MG/ML ~~LOC~~ SOSY
PREFILLED_SYRINGE | SUBCUTANEOUS | 4 refills | Status: DC
  Filled 2021-05-27: qty 1, 84d supply, fill #0
  Filled 2021-08-09: qty 1, 84d supply, fill #1
  Filled 2021-10-30: qty 1, 84d supply, fill #2
  Filled 2022-01-21: qty 1, 84d supply, fill #3
  Filled 2022-04-15: qty 1, 84d supply, fill #4

## 2021-05-28 ENCOUNTER — Other Ambulatory Visit (HOSPITAL_COMMUNITY): Payer: Self-pay

## 2021-06-02 ENCOUNTER — Other Ambulatory Visit: Payer: Self-pay

## 2021-06-02 MED FILL — Glucose Blood Test Strip: 90 days supply | Qty: 200 | Fill #0 | Status: AC

## 2021-06-03 ENCOUNTER — Other Ambulatory Visit: Payer: Self-pay

## 2021-06-08 DIAGNOSIS — G510 Bell's palsy: Secondary | ICD-10-CM

## 2021-06-08 HISTORY — DX: Bell's palsy: G51.0

## 2021-06-10 ENCOUNTER — Encounter: Payer: Self-pay | Admitting: Internal Medicine

## 2021-06-11 ENCOUNTER — Ambulatory Visit
Admission: RE | Admit: 2021-06-11 | Discharge: 2021-06-11 | Disposition: A | Discharge status: Home / Self Care | Payer: 59 | Source: Ambulatory Visit | Attending: Family Medicine | Admitting: Family Medicine

## 2021-06-11 ENCOUNTER — Other Ambulatory Visit: Payer: Self-pay

## 2021-06-11 VITALS — BP 165/89 | HR 93 | Temp 99.1°F | Resp 18 | Ht 65.0 in | Wt 255.0 lb

## 2021-06-11 DIAGNOSIS — G510 Bell's palsy: Secondary | ICD-10-CM | POA: Diagnosis not present

## 2021-06-11 MED ORDER — VALACYCLOVIR HCL 1 G PO TABS
1000.0000 mg | ORAL_TABLET | Freq: Three times a day (TID) | ORAL | 0 refills | Status: DC
  Filled 2021-06-11: qty 21, 7d supply, fill #0

## 2021-06-11 MED ORDER — PREDNISONE 20 MG PO TABS
60.0000 mg | ORAL_TABLET | Freq: Every day | ORAL | 0 refills | Status: AC
  Filled 2021-06-11: qty 21, 7d supply, fill #0

## 2021-06-11 NOTE — Discharge Instructions (Addendum)
Medications as prescribed.  Keep an eye on your blood sugar. You may have to increase your insulin.   OTC eye patch if desired. Use OTC eye drops frequently to prevent dry eyes.  Take care  Dr. Lacinda Axon

## 2021-06-11 NOTE — ED Provider Notes (Signed)
[facility]    CSN: [id number] Arrival date & time: 06/11/21  1150      History   Chief Complaint Chief Complaint  Patient presents with   Facial Droop    Right side   Otalgia    right    HPI  60 year old female presents with the above complaints.  Patient states that Thursday to Friday of last week she had some headache and pain around the right ear.  She took some Tylenol for this.  On Saturday, she developed facial palsy affecting the right side.  She continues to have the discomfort.  Her pain is 4/10 in severity.  No relieving factors.  She has no weakness.  No other associated symptoms.  No other complaints.  Past Medical History:  Diagnosis Date   Atypical mole 02/08/2020   Left forearm. Melanocytic proliferation of uncertain malignant potential, base involved.   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   Surgical menopause 06/19/2015   Status post abdominal supracervical subtotal hysterectomy 06/19/2015   Increased body mass index 04/11/2015   Stress incontinence 04/11/2015   Hyperlipidemia 03/30/2015   Vitamin D deficiency 03/30/2015    Past Surgical History:  Procedure Laterality Date   ABDOMINAL HYSTERECTOMY N/A 05/07/2015   Procedure: HYSTERECTOMY ABDOMINAL;  Surgeon: Brayton Mars, MD;  Location: [facility] ORS;  Service: Gynecology;  Laterality: N/A;   ANKLE ARTHROSCOPY Right    COLONOSCOPY WITH PROPOFOL N/A 04/03/2016   Procedure: COLONOSCOPY WITH PROPOFOL;  Surgeon: Manya Silvas, MD;  Location: [facility] ENDOSCOPY;  Service: Endoscopy;  Laterality: N/A;   ROBOTIC  ASSISTED LAPAROSCOPIC VAGINAL HYSTERECTOMY WITH FIBROID REMOVAL     Fibroid removal   TONSILLECTOMY AND ADENOIDECTOMY      OB History     Gravida  0   Para  0   Term  0   Preterm  0   AB  0   Living  0      SAB  0   IAB  0   Ectopic  0   Multiple  0   Live Births  0            Home Medications    Prior to Admission medications   Medication Sig Start Date End Date Taking? Authorizing Provider  canagliflozin (INVOKANA) 100 MG TABS tablet TAKE 1 TABLET (100 MG TOTAL) BY MOUTH DAILY. 01/28/21 01/28/22 Yes Philemon Kingdom, MD  ezetimibe (ZETIA) 10 MG tablet Take 1 tablet (10 mg total) by mouth daily. 04/09/21  Yes Philemon Kingdom, MD  Multiple Vitamin (MULTI-VITAMINS) TABS Take by mouth.   Yes [provider]  predniSONE (DELTASONE) 20 MG tablet Take 3 tablets (60 mg total) by mouth daily for 7 days. 06/11/21 06/18/21 Yes Latronda Spink G, DO  Risankizumab-rzaa (SKYRIZI) 150 MG/ML SOSY Inject 150 mg SQ every 12 weeks 05/27/21  Yes Jegede, Olugbemiga E, MD  Semaglutide,0.25 or 0.'5MG'$ /DOS, 2 MG/1.5ML SOPN INJECT 0.5 MG INTO THE SKIN ONCE A WEEK. 12/17/20 12/17/21 Yes Philemon Kingdom, MD  triamcinolone (KENALOG) 0.147 MG/GM topical spray APPLY TO THE AFFECTED AREA(S) ONCE OR TWICE DAILY UNTIL CLEAR,  THEN USE AS NEEDED 09/27/20 09/27/21 Yes Isenstein, Dayle Points, MD  valACYclovir (VALTREX) 1000 MG tablet Take 1 tablet (1,000 mg total) by mouth 3 (three) times daily. 06/11/21  Yes Diago Haik G, DO  Vitamin D, Ergocalciferol, (DRISDOL) 1.25 MG (50000 UNIT) CAPS capsule TAKE 1 CAPSULE BY MOUTH 2 TIMES A WEEK. 03/11/21 03/11/22 Yes Philemon Kingdom, MD  glucose blood test strip USE TO CHECK BLOOD SUGAR 2 TIMES A DAY. 12/03/20 12/03/21  Philemon Kingdom, MD  insulin degludec (TRESIBA FLEXTOUCH) 100 UNIT/ML FlexTouch Pen Inject 28 Units into the skin daily. 03/27/21 03/27/22  Philemon Kingdom, MD  Insulin Pen Needle (UNIFINE PENTIPS) 32G X 4 MM MISC USE DAILY AS INSTRUCTED 01/28/21 01/28/22   Philemon Kingdom, MD  Lancets (FREESTYLE) lancets Use to check blood sugar 2 times a day. 10/14/19   Philemon Kingdom, MD  nystatin-triamcinolone (MYCOLOG II) cream Apply 1 application topically 2 (two) times daily. Use for 10-14 days 04/12/18   Rubie Maid, MD    Family History Family History  Problem Relation Age of Onset   Diabetes Mother    Hypertension Mother    Colon cancer Father    Heart disease Father    Hypertension Father    Cancer Father    Colon cancer Sister    Cancer Sister    Breast cancer Paternal Aunt        times 3    Social History Social History   Tobacco Use   Smoking status: Never   Smokeless tobacco: Never  Vaping Use   Vaping Use: Never used  Substance Use Topics   Alcohol use: No    Alcohol/week: 0.0 standard drinks    Comment: maybe 1 drink/month - socially   Drug use: No     Allergies   Clobetasol   Review of Systems Review of Systems Per HPI  Physical Exam Triage Vital Signs ED Triage Vitals  Enc Vitals Group     BP 06/11/21 1205 (!) 165/89     Pulse Rate 06/11/21 1205 93     Resp 06/11/21 1205 18     Temp 06/11/21 1205 99.1 F (37.3 C)     Temp Source 06/11/21 1205 Oral     SpO2 06/11/21 1205 96 %     Weight 06/11/21 1203 255 lb (115.7 kg)     Height 06/11/21 1203 '5\' 5"'$  (1.651 m)     Head Circumference --      Peak Flow --      Pain Score 06/11/21 1202 4     Pain Loc --      Pain Edu? --      Excl. in [place]? --    No data found.  Updated Vital Signs BP (!) 165/89 (BP Location: Left Arm)   Pulse 93   Temp 99.1 F (37.3 C) (Oral)   Resp 18   Ht '5\' 5"'$  (1.651 m)   Wt 115.7 kg   LMP 05/01/2013   SpO2 96%   BMI 42.43 kg/m   Visual Acuity Right Eye Distance:   Left Eye Distance:   Bilateral Distance:    Right Eye Near:   Left Eye Near:    Bilateral Near:     Physical Exam Constitutional:      General: She is not in acute distress.    Appearance: She is not ill-appearing.  HENT:     Head: Normocephalic  and atraumatic.     Right Ear: Tympanic membrane normal.     Left Ear: Tympanic  membrane normal.  Pulmonary:     Effort: Pulmonary effort is normal. No respiratory distress.  Neurological:     Mental Status: She is alert.     Motor: No weakness.     Comments: Minimal blinking of the right eye.  Unable to raise the right eyebrow.  Facial droop noted on the right side.  Patient unable to smile on the right side.  All these findings are consistent with Bell's palsy.     UC Treatments / Results  Labs (all labs ordered are listed, but only abnormal results are displayed) Labs Reviewed - No data to display  EKG   Radiology No results found.  Procedures Procedures (including critical care time)  Medications Ordered in UC Medications - No data to display  Initial Impression / Assessment and Plan / UC Course  I have reviewed the triage vital signs and the nursing notes.  Pertinent labs & imaging results that were available during my care of the patient were reviewed by me and considered in my medical decision making (see chart for details).    60 year old female presents with Bell's palsy.  Treating with high-dose corticosteroids and Valtrex.  Advised to use moisturizing eyedrops frequently to prevent dry eye.  Advised patient that she may need to increase her insulin as corticosteroids will cause hypoglycemia.   Final diagnoses:  Bell's palsy     Discharge Instructions      Medications as prescribed.  Keep an eye on your blood sugar. You may have to increase your insulin.   OTC eye patch if desired. Use OTC eye drops frequently to prevent dry eyes.  Take care  Dr. Lacinda Axon    ED Prescriptions     Medication Sig Dispense Auth. Provider   predniSONE (DELTASONE) 20 MG tablet Take 3 tablets (60 mg total) by mouth daily for 7 days. 21 tablet Shakia Sebastiano G, DO   valACYclovir (VALTREX) 1000 MG tablet Take 1 tablet (1,000 mg total) by mouth 3 (three) times daily. 21 tablet  Thersa Salt G, DO      PDMP not reviewed this encounter.   Coral Spikes, [place] 06/11/21 1232

## 2021-06-11 NOTE — ED Triage Notes (Signed)
Pt c/o right-sided facial palsy since Saturday night. Pt reports headache Thursday-Friday and some right ear pain. Pt reports headache comes and goes. Pt is concerned she may have an ear infection.

## 2021-06-19 ENCOUNTER — Other Ambulatory Visit (HOSPITAL_COMMUNITY): Payer: Self-pay

## 2021-06-25 ENCOUNTER — Other Ambulatory Visit: Payer: Self-pay

## 2021-06-25 MED FILL — Ergocalciferol Cap 1.25 MG (50000 Unit): ORAL | 84 days supply | Qty: 24 | Fill #1 | Status: AC

## 2021-07-01 ENCOUNTER — Other Ambulatory Visit: Payer: Self-pay | Admitting: Internal Medicine

## 2021-07-01 ENCOUNTER — Other Ambulatory Visit: Payer: Self-pay

## 2021-07-02 ENCOUNTER — Other Ambulatory Visit: Payer: Self-pay

## 2021-07-03 ENCOUNTER — Other Ambulatory Visit: Payer: Self-pay

## 2021-07-03 MED FILL — Semaglutide Soln Pen-inj 0.25 or 0.5 MG/DOSE (2 MG/1.5ML): SUBCUTANEOUS | 84 days supply | Qty: 4.5 | Fill #0 | Status: CN

## 2021-07-05 ENCOUNTER — Other Ambulatory Visit: Payer: Self-pay

## 2021-07-05 MED ORDER — DAPAGLIFLOZIN PROPANEDIOL 10 MG PO TABS
10.0000 mg | ORAL_TABLET | Freq: Every day | ORAL | 3 refills | Status: DC
  Filled 2021-07-05 – 2021-07-10 (×2): qty 90, 90d supply, fill #0
  Filled 2021-10-25: qty 90, 90d supply, fill #1
  Filled 2022-01-21: qty 90, 90d supply, fill #2
  Filled 2022-04-15: qty 90, 90d supply, fill #3

## 2021-07-09 ENCOUNTER — Encounter: Payer: Self-pay | Admitting: Internal Medicine

## 2021-07-09 ENCOUNTER — Other Ambulatory Visit: Payer: Self-pay

## 2021-07-10 ENCOUNTER — Other Ambulatory Visit: Payer: Self-pay

## 2021-07-10 ENCOUNTER — Other Ambulatory Visit: Payer: Self-pay | Admitting: Internal Medicine

## 2021-07-10 MED ORDER — OZEMPIC (1 MG/DOSE) 4 MG/3ML ~~LOC~~ SOPN
1.0000 mg | PEN_INJECTOR | SUBCUTANEOUS | 3 refills | Status: DC
  Filled 2021-07-10: qty 9, 84d supply, fill #0
  Filled 2021-12-01: qty 9, 84d supply, fill #1

## 2021-07-15 ENCOUNTER — Other Ambulatory Visit: Payer: Self-pay | Admitting: Internal Medicine

## 2021-07-15 ENCOUNTER — Other Ambulatory Visit: Payer: Self-pay

## 2021-07-15 MED ORDER — TRESIBA FLEXTOUCH 100 UNIT/ML ~~LOC~~ SOPN
28.0000 [IU] | PEN_INJECTOR | Freq: Every day | SUBCUTANEOUS | 1 refills | Status: DC
  Filled 2021-07-15: qty 15, 53d supply, fill #0
  Filled 2021-09-09: qty 15, 53d supply, fill #1

## 2021-07-17 ENCOUNTER — Other Ambulatory Visit (HOSPITAL_COMMUNITY): Payer: Self-pay

## 2021-07-30 DIAGNOSIS — Z760 Encounter for issue of repeat prescription: Secondary | ICD-10-CM | POA: Diagnosis not present

## 2021-08-05 ENCOUNTER — Encounter: Payer: Self-pay | Admitting: General Surgery

## 2021-08-09 ENCOUNTER — Other Ambulatory Visit (HOSPITAL_COMMUNITY): Payer: Self-pay

## 2021-08-13 ENCOUNTER — Encounter: Payer: Self-pay | Admitting: Internal Medicine

## 2021-08-13 ENCOUNTER — Other Ambulatory Visit: Payer: Self-pay

## 2021-08-13 ENCOUNTER — Ambulatory Visit: Payer: 59 | Admitting: Internal Medicine

## 2021-08-13 VITALS — BP 140/86 | HR 95 | Ht 65.0 in | Wt 264.2 lb

## 2021-08-13 DIAGNOSIS — E1165 Type 2 diabetes mellitus with hyperglycemia: Secondary | ICD-10-CM

## 2021-08-13 DIAGNOSIS — E785 Hyperlipidemia, unspecified: Secondary | ICD-10-CM | POA: Diagnosis not present

## 2021-08-13 DIAGNOSIS — Z794 Long term (current) use of insulin: Secondary | ICD-10-CM | POA: Diagnosis not present

## 2021-08-13 LAB — POCT GLYCOSYLATED HEMOGLOBIN (HGB A1C): Hemoglobin A1C: 7.3 % — AB (ref 4.0–5.6)

## 2021-08-13 NOTE — Progress Notes (Signed)
ID: Cassandra Munoz, female   DOB: 1961/09/23, 60 y.o.   MRN: [id number]  This visit occurred during the SARS-CoV-2 public health emergency.  Safety protocols were in place, including screening questions prior to the visit, additional usage of staff PPE, and extensive cleaning of exam room while observing appropriate contact time as indicated for disinfecting solutions.   HPI: Cassandra Munoz is a 60 y.o.-year-old female, initially referred by Cassandra Munoz), for management of DM2, 2012, insulin-dependent, uncontrolled, without long term complications. Last visit 4 months ago. She will see [facility].  Interim history: She was previously on [place] for Psoriasis 5 years ago >> Munoz pbs >> stopped 07/2019 but Munoz symptoms persisted.  Now on Skyrizi every 12 weeks. In the past she has steroid injections and prednisone tapers for back pain, then started seeing a chiropractor and stopped steroids. Since last visit, he developed Bell's palsy 05/2021. This had a long course and she is not recovered 100%. Has an appt with ENT next month.Was on Prednisone high dose x 1 week and Acyclovir.  No increased urination, blurry vision, nausea, chest pain.  Reviewed HbA1c levels: Lab Results  Component Value Date   HGBA1C 7.1 (A) 04/09/2021   HGBA1C 7.2 (A) 11/23/2020   HGBA1C 6.7 (A) 04/20/2020  2012: HbA1c 7%  She is on: -  >> Farxiga 10 mg daily - changed 07/2021 - Ozempic 0.5 mg weekly-added 06/2019  >> 0.25 mg alternating with 0.5 mg every other week >> 0.5 mg >> 0.75 mg >> 1 mg weekly (increased 3 weeks ago) -  >> Tresiba 20-25 >> 25 >> 28 units daily Prev. On Metformin ER 1000 >> 2000 mg with dinner >> decreased dose and then only taking it sporadically due to Munoz symptoms >>  stopped.  Pt checks her sugars 1-2 times a day: - am: 108, 116-162 >> 110, 118-160, 169, 220 >> 105-154, 168, 173 - 2h after b'fast 128 >> n/c >> 93-127 >> n/c >> 130, 186 >> n/c - before lunch: 110-118 >>  119-134 >> 67, 91-135 >> n/c - 2h after lunch:   n/c >> 125-165 >> 89, 110 >> n/c - before dinner:  109-148 >> 118-134 >> 121-136, 155 >> 98, 129 - 2h after dinner:   170-204 >> n/c >> 149 >> 157, 167 >> n/c - bedtime:  149-155 >> 116-139, 147, 166 >> n/c - nighttime: n/c It is unclear at which level she has hypoglycemia awareness. Highest sugar was 204 >> 197 >> 166 >> 220 >> 273.  Glucometer: OneTouch  -No CKD, last BUN/creatinine:  Lab Results  Component Value Date   BUN 19 11/23/2020   Lab Results  Component Value Date   CREATININE 0.84 11/23/2020   -+ HL; last set of lipids: Lab Results  Component Value Date   CHOL 229 (H) 11/23/2020   HDL 47.00 11/23/2020   LDLCALC 135 (H) 11/22/2018   LDLDIRECT 147.0 11/23/2020   TRIG 213.0 (H) 11/23/2020   CHOLHDL 5 11/23/2020  She repeatedly refused statins.  However, since last visit, she started ezetimibe 10 mg daily.  He tolerates this well.  - last eye exam was in 04/25/2021: reportedly No DR.   -No numbness and tingling in her feet.  She has a history of vitamin D deficiency -she is on ergocalciferol 50,000 units twice a day per PCP.  Reviewed available vitamin D levels: 06/15/2019: Vitamin D 51.5 Lab Results  Component Value Date   VD25OH 17.2 (L) 11/22/2018  VD25OH 36.35 11/23/2017   VD25OH 60.31 08/15/2016  02/15/2015: Vitamin D 9.8  She has a history of transaminitis. She also has a history of psoriasis.  Previously on [place], but this caused Munoz symptoms. Now on [employer].   Latest TSH was normal: 06/15/2019: TSH 2.2 Lab Results  Component Value Date   TSH 2.270 04/12/2018   She saw Cassandra Munoz >> Food allergy test >> she was found to have multiple allergies, including to gluten, dairy, nuts, soy, etc. She started to eliminate dairy and felt better.  Works in the NICU at [employer]: + See HPI  I reviewed pt's medications, allergies, PMH, social hx, family hx, and changes were  documented in the history of present illness. Otherwise, unchanged from my initial visit note.  Past Medical History:  Diagnosis Date   Atypical mole 02/08/2020   Left forearm. Melanocytic proliferation of uncertain malignant potential, base involved.   Diabetes mellitus without complication (HCC)    Elevated cholesterol with elevated triglycerides    Psoriasis    Sciatic pain 2015   Vitamin D deficiency    Past Surgical History:  Procedure Laterality Date   ABDOMINAL HYSTERECTOMY N/A 05/07/2015   Procedure: HYSTERECTOMY ABDOMINAL;  Surgeon: Brayton Mars, MD;  Location: [facility] ORS;  Service: Gynecology;  Laterality: N/A;   ANKLE ARTHROSCOPY Right    COLONOSCOPY WITH PROPOFOL N/A 04/03/2016   Procedure: COLONOSCOPY WITH PROPOFOL;  Surgeon: Manya Silvas, MD;  Location: 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 9 mL 3   dapagliflozin propanediol (FARXIGA) 10 MG TABS tablet Take 1 tablet (10 mg total) by mouth daily every morning 90 tablet 3   ezetimibe (ZETIA) 10 MG tablet Take 1 tablet (10 mg total) by mouth daily. 90 tablet 3   glucose blood test strip USE TO CHECK BLOOD SUGAR 2 TIMES A DAY. 200 strip 12   insulin degludec (TRESIBA FLEXTOUCH) 100 UNIT/ML FlexTouch Pen  Inject 28 Units into the skin daily. 15 mL 1   Insulin Pen Needle (UNIFINE PENTIPS) 32G X 4 MM MISC USE DAILY AS INSTRUCTED 100 each 1   Lancets (FREESTYLE) lancets Use to check blood sugar 2 times a day. 200 each 12   Multiple Vitamin (MULTI-VITAMINS) TABS Take by mouth.     nystatin-triamcinolone (MYCOLOG II) cream Apply 1 application topically 2 (two) times daily. Use for 10-14 days 30 g 2   Risankizumab-rzaa (SKYRIZI) 150 MG/ML SOSY Inject 150 mg SQ every 12 weeks 1 mL 4   triamcinolone (KENALOG) 0.147 MG/GM topical spray APPLY TO THE AFFECTED AREA(S) ONCE OR TWICE DAILY UNTIL CLEAR, THEN USE AS NEEDED 100 g 3   valACYclovir (  VALTREX) 1000 MG tablet Take 1 tablet (1,000 mg total) by mouth 3 (three) times daily. 21 tablet 0   Vitamin D, Ergocalciferol, (DRISDOL) 1.25 MG (50000 UNIT) CAPS capsule TAKE 1 CAPSULE BY MOUTH 2 TIMES A WEEK. 24 capsule 1   No current facility-administered medications on file prior to visit.   Allergies  Allergen Reactions   Clobetasol Rash    Ointment used for psorasis   Family History  Problem Relation Age of Onset   Diabetes Mother    Hypertension Mother    Colon cancer Father    Heart disease Father    Hypertension Father    Cancer Father    Colon cancer Sister    Cancer Sister    Breast cancer Paternal Aunt        times 3   PE: BP 140/86 (BP Location: Left Arm, Patient Position: Sitting, Cuff Size: Large)   Pulse 95   Ht 5\' 5"  (1.651 m)   Wt 264 lb 3.2 oz (119.8 kg)   LMP 05/01/2013   SpO2 98%   BMI 43.97 kg/m  Body mass index is 43.97 kg/m. Wt Readings from Last 3 Encounters:  08/13/21 264 lb 3.2 oz (119.8 kg)  06/11/21 255 lb (115.7 kg)  04/09/21 266 lb 9.6 oz (120.9 kg)   Constitutional: overweight, in NAD Eyes: PERRLA, EOMI, no exophthalmos ENT: moist mucous membranes, no thyromegaly, no cervical lymphadenopathy Cardiovascular: tachycardia, RR, No MRG Respiratory: CTA B Gastrointestinal: abdomen soft, NT, ND,  BS+ Musculoskeletal: no deformities, strength intact in all 4 Skin: moist, warm, no rashes Neurological: no tremor with outstretched hands, DTR normal in all 4  ASSESSMENT: 1. DM2, insulin-dependent, w/o long term complications but with hyperglycemia.  2. HL  3.  Obesity class III  PLAN:  1. Patient with history of controlled type 2 diabetes with occasional hyperglycemic spikes, due to dietary indiscretions.  She is on an SGLT2 inhibitor, weekly GLP-1 receptor agonist and daily insulin.  In the past, she had nausea and occasional vomiting and eructations with Ozempic but these improved.  Her lipase level was normal.  At last visit we discussed about increasing the dose of Ozempic slightly, initially to 0.75 mg weekly and then try 1 mg weekly after a few weeks, if tolerated well.  Of note, before last visit, she mentions nausea with [place] which she takes every 3 months.  In the weeks that she takes this, she usually takes a lower dose of Ozempic.  At last visit sugars were still higher than goal in the morning but better before dinner.  HbA1c was lower, at 7.1%. -Since last visit she had to switch from [place] to [place] in 06/2021.  I explained that this is slightly less potent, but definitely still efficacious. -At this visit, sugars appear to have improved since her Bell's palsy episode and also compared to last visit.  She is mostly checking blood sugars in the morning, and only had 2 values later in the day in the last 2 months.  We discussed about the importance of checking sugars more frequently later in the day, rotating the check times.  For now, I advised him to increase [place] slightly to improve the morning sugars, but continue [place] and Ozempic 1 mg weekly, which she tolerates well. - I suggested to:  Patient Instructions  Please continue: - Farxiga 10 mg in a.m. - Ozempic 1 mg weekly  Please increase: - Tresiba 30-32 units daily   Continue: - Ezetimibe 10 mg  daily  Please come back for a follow-up appointment in 3 months.  - we checked her HbA1c: 7.3% (higher) - advised to check sugars at different times of the day - 1x a day, rotating check times - advised for yearly eye exams >> she is UTD - return to clinic in 4 months  2. HL -Reviewed latest lipid panel from 10/2020: LDL elevated, as are her triglycerides.  HDL at goal: Lab Results  Component Value Date   CHOL 229 (H) 11/23/2020   HDL 47.00 11/23/2020   LDLCALC 135 (H) 11/22/2018   LDLDIRECT 147.0 11/23/2020   TRIG 213.0 (H) 11/23/2020   CHOLHDL 5 11/23/2020  -At previous visit, I suggested statins, but she declined for fear her short-term memory loss. -At last visit she agreed to start ezetimibe 10 mg daily - she is taking this now -We will check lipid panel at next visit along with the rest of her annual labs   3.  Obesity class III -continue SGLT 2 inhibitor and GLP-1 receptor agonist which should also help with weight loss -She gained 3 pounds before last visit, 6 pounds between the previous visit -At this visit, she is net -2 pounds since last visit.  She initially lost weight during her Bell's palsy episode as she could not eat well, but she gained most of it back.  Philemon Kingdom, MD PhD [facility]

## 2021-08-13 NOTE — Patient Instructions (Addendum)
Please continue: - Farxiga 10 mg in a.m. - Ozempic 1 mg weekly  Please increase: - Tresiba 30-32 units daily   Continue: - Ezetimibe 10 mg daily  Please come back for a follow-up appointment in 3 months.

## 2021-08-14 ENCOUNTER — Other Ambulatory Visit: Payer: Self-pay

## 2021-08-19 ENCOUNTER — Telehealth: Payer: Self-pay | Admitting: Pharmacist

## 2021-08-19 NOTE — Telephone Encounter (Signed)
Called patient to schedule an appointment for the [employer] [facility]. I was unable to reach the patient so I left a HIPAA-compliant message requesting that the patient return my call.   Luke Van Ausdall, PharmD, BCACP, CPP Clinical Pharmacist [facility] [phone]  

## 2021-08-27 ENCOUNTER — Other Ambulatory Visit: Payer: Self-pay | Admitting: Otolaryngology

## 2021-08-27 DIAGNOSIS — R29810 Facial weakness: Secondary | ICD-10-CM

## 2021-08-27 DIAGNOSIS — H9311 Tinnitus, right ear: Secondary | ICD-10-CM | POA: Diagnosis not present

## 2021-08-27 DIAGNOSIS — G510 Bell's palsy: Secondary | ICD-10-CM | POA: Diagnosis not present

## 2021-08-27 DIAGNOSIS — H903 Sensorineural hearing loss, bilateral: Secondary | ICD-10-CM | POA: Diagnosis not present

## 2021-09-06 ENCOUNTER — Other Ambulatory Visit (HOSPITAL_COMMUNITY): Payer: Self-pay

## 2021-09-09 ENCOUNTER — Other Ambulatory Visit: Payer: Self-pay | Admitting: Internal Medicine

## 2021-09-10 ENCOUNTER — Ambulatory Visit
Admission: RE | Admit: 2021-09-10 | Discharge: 2021-09-10 | Disposition: A | Discharge status: Home / Self Care | Payer: 59 | Source: Ambulatory Visit | Attending: Otolaryngology | Admitting: Otolaryngology

## 2021-09-10 ENCOUNTER — Other Ambulatory Visit: Payer: Self-pay

## 2021-09-10 DIAGNOSIS — R531 Weakness: Secondary | ICD-10-CM | POA: Diagnosis not present

## 2021-09-10 DIAGNOSIS — H9311 Tinnitus, right ear: Secondary | ICD-10-CM | POA: Insufficient documentation

## 2021-09-10 DIAGNOSIS — R29810 Facial weakness: Secondary | ICD-10-CM | POA: Diagnosis not present

## 2021-09-10 DIAGNOSIS — H9319 Tinnitus, unspecified ear: Secondary | ICD-10-CM | POA: Diagnosis not present

## 2021-09-10 MED ORDER — GADOBUTROL 1 MMOL/ML IV SOLN
10.0000 mL | Freq: Once | INTRAVENOUS | Status: AC | PRN
  Administered 2021-09-10: 10 mL via INTRAVENOUS

## 2021-09-10 MED ORDER — UNIFINE PENTIPS 32G X 4 MM MISC
1 refills | Status: AC
  Filled 2021-09-10: qty 100, 90d supply, fill #0
  Filled 2021-12-23: qty 100, 90d supply, fill #1

## 2021-09-20 ENCOUNTER — Other Ambulatory Visit: Payer: Self-pay | Admitting: Internal Medicine

## 2021-09-20 ENCOUNTER — Other Ambulatory Visit: Payer: Self-pay

## 2021-09-23 ENCOUNTER — Other Ambulatory Visit: Payer: Self-pay

## 2021-09-23 MED FILL — Ergocalciferol Cap 1.25 MG (50000 Unit): ORAL | 84 days supply | Qty: 24 | Fill #0 | Status: AC

## 2021-09-30 ENCOUNTER — Other Ambulatory Visit: Payer: Self-pay

## 2021-10-08 ENCOUNTER — Ambulatory Visit: Payer: 59 | Admitting: Internal Medicine

## 2021-10-08 ENCOUNTER — Other Ambulatory Visit: Payer: Self-pay

## 2021-10-08 ENCOUNTER — Encounter: Payer: Self-pay | Admitting: Internal Medicine

## 2021-10-08 VITALS — BP 122/81 | HR 66 | Temp 98.1°F | Ht 64.96 in | Wt 262.2 lb

## 2021-10-08 DIAGNOSIS — Z794 Long term (current) use of insulin: Secondary | ICD-10-CM | POA: Diagnosis not present

## 2021-10-08 DIAGNOSIS — E785 Hyperlipidemia, unspecified: Secondary | ICD-10-CM

## 2021-10-08 DIAGNOSIS — E1169 Type 2 diabetes mellitus with other specified complication: Secondary | ICD-10-CM | POA: Diagnosis not present

## 2021-10-08 DIAGNOSIS — E782 Mixed hyperlipidemia: Secondary | ICD-10-CM | POA: Diagnosis not present

## 2021-10-08 DIAGNOSIS — E119 Type 2 diabetes mellitus without complications: Secondary | ICD-10-CM

## 2021-10-08 LAB — URINALYSIS, ROUTINE W REFLEX MICROSCOPIC
Bilirubin, UA: NEGATIVE
Ketones, UA: NEGATIVE
Leukocytes,UA: NEGATIVE
Nitrite, UA: NEGATIVE
Protein,UA: NEGATIVE
RBC, UA: NEGATIVE
Specific Gravity, UA: 1.025 (ref 1.005–1.030)
Urobilinogen, Ur: 0.2 mg/dL (ref 0.2–1.0)
pH, UA: 5 (ref 5.0–7.5)

## 2021-10-08 NOTE — Progress Notes (Signed)
BP 122/81    Pulse 66    Temp 98.1 F (36.7 C) (Oral)    Ht 5' 4.96" (1.65 m)    Wt 262 lb 3.2 oz (118.9 kg)    LMP 05/01/2013    SpO2 98%    BMI 43.68 kg/m    Subjective:    Patient ID: Cassandra Munoz, female    DOB: [birth year], 60 y.o.   MRN: [id number]  Chief Complaint  Patient presents with   New Patient (Initial Visit)    To est. Care    Diabetes    DM eye exam requested from [facility], Patient states that last A1c was 7.3 in October   Hypertension    HPI: Cassandra Munoz is a 60 y.o. female  Pt is new to the practice here to establish care, has a ho DM psoriasis - takes skyrizi  / HTN   Moved from [place] - 2005 was seeing pcp x obgyn in [facility] and dr. 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 A1c baseline is 6.1    Diabetes She presents for her initial (fsbs 108 --- 130, 28 units of tresiba) diabetic visit. She has type 2 (she feels like when she takes ozempic and skyrizi for psoriaisis she has some nausea ( takes skyrizi every 12 weeks )) diabetes mellitus. Her disease course has been worsening. Pertinent negatives for hypoglycemia include no mood changes or nervousness/anxiousness. Pertinent negatives for diabetes include no fatigue, no foot paresthesias, no foot ulcerations, no polydipsia, no polyphagia and no polyuria. Symptoms are worsening.  Hyperlipidemia This is a chronic (worseing, reluctant to take statins ? why has seen SE of statins causing memory loss d/w her that this is problaly in a very nonspecific population and d/w her risk vs benefits of statins in loweriun CAD and CVA risks) problem. The problem is uncontrolled.   Chief Complaint  Patient presents with   New Patient (Initial Visit)    To est. Care    Diabetes    DM eye exam requested from [facility], Patient states that last A1c was 7.3 in October   Hypertension    Relevant past medical, surgical, family and social  history reviewed and updated as indicated. Interim medical history since our last visit reviewed. Allergies and medications reviewed and updated.  Review of Systems  Constitutional:  Negative for fatigue.  Endocrine: Negative for polydipsia, polyphagia and polyuria.  Psychiatric/Behavioral:  The patient is not nervous/anxious.    Per HPI unless specifically indicated above     Objective:    BP 122/81    Pulse 66    Temp 98.1 F (36.7 C) (Oral)    Ht 5' 4.96" (1.65 m)    Wt 262 lb 3.2 oz (118.9 kg)    LMP 05/01/2013    SpO2 98%    BMI 43.68 kg/m   Wt Readings from Last 3 Encounters:  10/08/21 262 lb 3.2 oz (118.9 kg)  08/13/21 264 lb 3.2 oz (119.8 kg)  06/11/21 255 lb (115.7 kg)    Physical Exam Vitals and nursing note reviewed.  Constitutional:      General: She is not in acute distress.    Appearance: Normal appearance. She is not ill-appearing or diaphoretic.  Eyes:     Conjunctiva/sclera: Conjunctivae normal.  Pulmonary:     Breath sounds: No rhonchi.  Abdominal:     General: Abdomen is flat. Bowel sounds are  normal. There is no distension.     Palpations: Abdomen is soft. There is no mass.     Tenderness: There is no abdominal tenderness. There is no guarding.  Skin:    General: Skin is warm and dry.     Coloration: Skin is not jaundiced.     Findings: No erythema.  Neurological:     Mental Status: She is alert.    Results for orders placed or performed in visit on 08/13/21  POCT HgB A1C  Result Value Ref Range   Hemoglobin A1C 7.3 (A) 4.0 - 5.6 %   HbA1c POC (<> result, manual entry)     HbA1c, POC (prediabetic range)     HbA1c, POC (controlled diabetic range)          Current Outpatient Medications:    dapagliflozin propanediol (FARXIGA) 10 MG TABS tablet, Take 1 tablet (10 mg total) by mouth daily every morning, Disp: 90 tablet, Rfl: 3   ezetimibe (ZETIA) 10 MG tablet, Take 1 tablet (10 mg total) by mouth daily., Disp: 90 tablet, Rfl: 3   glucose blood  test strip, USE TO CHECK BLOOD SUGAR 2 TIMES A DAY., Disp: 200 strip, Rfl: 12   insulin degludec (TRESIBA FLEXTOUCH) 100 UNIT/ML FlexTouch Pen, Inject 28 Units into the skin daily., Disp: 15 mL, Rfl: 1   Insulin Pen Needle (UNIFINE PENTIPS) 32G X 4 MM MISC, USE DAILY AS INSTRUCTED, Disp: 100 each, Rfl: 1   Lancets (FREESTYLE) lancets, Use to check blood sugar 2 times a day., Disp: 200 each, Rfl: 12   Multiple Vitamin (MULTI-VITAMINS) TABS, Take by mouth., Disp: , Rfl:    nystatin-triamcinolone (MYCOLOG II) cream, Apply 1 application topically 2 (two) times daily. Use for 10-14 days, Disp: 30 g, Rfl: 2   Risankizumab-rzaa (SKYRIZI) 150 MG/ML SOSY, Inject 150 mg SQ every 12 weeks, Disp: 1 mL, Rfl: 4   Semaglutide, 1 MG/DOSE, (OZEMPIC, 1 MG/DOSE,) 4 MG/3ML SOPN, Inject 1 mg into the skin once a week., Disp: 9 mL, Rfl: 3   Vitamin D, Ergocalciferol, (DRISDOL) 1.25 MG (50000 UNIT) CAPS capsule, TAKE 1 CAPSULE BY MOUTH 2 TIMES A WEEK., Disp: 24 capsule, Rfl: 1    Assessment & Plan:  DM is on tresiba 28 units q pm and ozempic 1 mg once a week and farxiga 10 mg. check HbA1c,  urine  microalbumin  diabetic diet plan given to pt  adviced regarding hypoglycemia and instructions given to pt today on how to prevent and treat the same if it were to occur. pt acknowledges the plan and voices understanding of the same.  exercise plan given and encouraged.   advice diabetic yearly podiatry, ophthalmology , nutritionist , dental check q 6 months  2. Psoriasis is on skyrizi  Stable, chroinc, sees derm fu and mx per such   3. HLD: is on zetia for such  recheck FLP, check LFT's work on diet, SE of meds explained to pt. low fat and high fiber diet explained to pt. Declines statins.    4. Bells palsy :  Will need to correct HLD to prevent furtherneurological complications /  CVA  Problem List Items Addressed This Visit   None    No orders of the defined types were placed in this encounter.    No orders of  the defined types were placed in this encounter.    Follow up plan: No follow-ups on file.

## 2021-10-08 NOTE — Patient Instructions (Signed)

## 2021-10-09 LAB — CBC WITH DIFFERENTIAL/PLATELET
Basophils Absolute: 0.1 10*3/uL (ref 0.0–0.2)
Basos: 1 %
EOS (ABSOLUTE): 0.3 10*3/uL (ref 0.0–0.4)
Eos: 4 %
Hematocrit: 52.5 % — ABNORMAL HIGH (ref 34.0–46.6)
Hemoglobin: 16.9 g/dL — ABNORMAL HIGH (ref 11.1–15.9)
Immature Grans (Abs): 0 10*3/uL (ref 0.0–0.1)
Immature Granulocytes: 0 %
Lymphocytes Absolute: 2.4 10*3/uL (ref 0.7–3.1)
Lymphs: 38 %
MCH: 27.9 pg (ref 26.6–33.0)
MCHC: 32.2 g/dL (ref 31.5–35.7)
MCV: 87 fL (ref 79–97)
Monocytes Absolute: 0.6 10*3/uL (ref 0.1–0.9)
Monocytes: 10 %
Neutrophils Absolute: 3 10*3/uL (ref 1.4–7.0)
Neutrophils: 47 %
Platelets: 235 10*3/uL (ref 150–450)
RBC: 6.06 x10E6/uL — ABNORMAL HIGH (ref 3.77–5.28)
RDW: 13.5 % (ref 11.7–15.4)
WBC: 6.4 10*3/uL (ref 3.4–10.8)

## 2021-10-09 LAB — COMPREHENSIVE METABOLIC PANEL
ALT: 50 IU/L — ABNORMAL HIGH (ref 0–32)
AST: 32 IU/L (ref 0–40)
Albumin/Globulin Ratio: 1.8 (ref 1.2–2.2)
Albumin: 4.9 g/dL (ref 3.8–4.9)
Alkaline Phosphatase: 62 IU/L (ref 44–121)
BUN/Creatinine Ratio: 25 (ref 12–28)
BUN: 20 mg/dL (ref 8–27)
Bilirubin Total: 0.4 mg/dL (ref 0.0–1.2)
CO2: 23 mmol/L (ref 20–29)
Calcium: 9.8 mg/dL (ref 8.7–10.3)
Chloride: 102 mmol/L (ref 96–106)
Creatinine, Ser: 0.79 mg/dL (ref 0.57–1.00)
Globulin, Total: 2.7 g/dL (ref 1.5–4.5)
Glucose: 104 mg/dL — ABNORMAL HIGH (ref 70–99)
Potassium: 4.3 mmol/L (ref 3.5–5.2)
Sodium: 141 mmol/L (ref 134–144)
Total Protein: 7.6 g/dL (ref 6.0–8.5)
eGFR: 86 mL/min/{1.73_m2} (ref >59)

## 2021-10-09 LAB — TSH: TSH: 2.17 u[IU]/mL (ref 0.450–4.500)

## 2021-10-25 ENCOUNTER — Other Ambulatory Visit: Payer: Self-pay

## 2021-10-30 ENCOUNTER — Other Ambulatory Visit (HOSPITAL_COMMUNITY): Payer: Self-pay

## 2021-11-01 ENCOUNTER — Other Ambulatory Visit (HOSPITAL_COMMUNITY): Payer: Self-pay

## 2021-11-04 ENCOUNTER — Other Ambulatory Visit (HOSPITAL_COMMUNITY): Payer: Self-pay

## 2021-11-05 ENCOUNTER — Other Ambulatory Visit: Payer: Self-pay

## 2021-11-05 ENCOUNTER — Ambulatory Visit: Payer: 59 | Admitting: Internal Medicine

## 2021-11-05 ENCOUNTER — Encounter: Payer: Self-pay | Admitting: Internal Medicine

## 2021-11-05 VITALS — BP 137/83 | HR 78 | Temp 97.7°F | Ht 64.96 in | Wt 259.2 lb

## 2021-11-05 DIAGNOSIS — E119 Type 2 diabetes mellitus without complications: Secondary | ICD-10-CM | POA: Diagnosis not present

## 2021-11-05 DIAGNOSIS — D751 Secondary polycythemia: Secondary | ICD-10-CM

## 2021-11-05 LAB — MICROALBUMIN, URINE WAIVED
Creatinine, Urine Waived: 100 mg/dL (ref 10–300)
Microalb, Ur Waived: 10 mg/L (ref 0–19)
Microalb/Creat Ratio: <30 mg/g (ref <30)

## 2021-11-05 LAB — BAYER DCA HB A1C WAIVED: HB A1C (BAYER DCA - WAIVED): 7 % — ABNORMAL HIGH (ref 4.8–5.6)

## 2021-11-05 MED ORDER — ONDANSETRON HCL 8 MG PO TABS
8.0000 mg | ORAL_TABLET | Freq: Three times a day (TID) | ORAL | 1 refills | Status: DC | PRN
  Filled 2021-11-05: qty 20, 7d supply, fill #0

## 2021-11-05 NOTE — Progress Notes (Signed)
BP 137/83    Pulse 78    Temp 97.7 F (36.5 C) (Oral)    Ht 5' 4.96" (1.65 m)    Wt 259 lb 3.2 oz (117.6 kg)    LMP 05/01/2013    SpO2 97%    BMI 43.18 kg/m    Subjective:    Patient ID: Cassandra Munoz, female    DOB: [birth year], 61 y.o.   MRN: [id number]  Chief Complaint  Patient presents with   Diabetes    Go over lab results     HPI: Cassandra Munoz is a 61 y.o. female  Diabetes She presents for her follow-up (fsbs better still on - tresiba 24 untis. check a1c today ozempic 1 mg) diabetic visit. She has type 2 diabetes mellitus. Her disease course has been improving. Pertinent negatives for hypoglycemia include no confusion, dizziness, headaches, speech difficulty or tremors. Pertinent negatives for diabetes include no blurred vision, no chest pain, no fatigue, no foot paresthesias, no foot ulcerations, no polydipsia, no polyphagia, no polyuria, no visual change and no weakness.  Hyperlipidemia This is a chronic problem. The problem is controlled. Pertinent negatives include no chest pain, myalgias or shortness of breath. There are no compliance problems.    Chief Complaint  Patient presents with   Diabetes    Go over lab results     Relevant past medical, surgical, family and social history reviewed and updated as indicated. Interim medical history since our last visit reviewed. Allergies and medications reviewed and updated.  Review of Systems  Constitutional:  Negative for activity change, appetite change, chills, fatigue and fever.  HENT:  Negative for congestion, ear discharge, ear pain and facial swelling.   Eyes:  Negative for blurred vision, pain and itching.  Respiratory:  Negative for cough, chest tightness, shortness of breath and wheezing.   Cardiovascular:  Negative for chest pain, palpitations and leg swelling.  Gastrointestinal:  Negative for abdominal distention, abdominal pain, blood in stool, constipation, diarrhea, nausea and vomiting.  Endocrine:  Negative for cold intolerance, heat intolerance, polydipsia, polyphagia and polyuria.  Genitourinary:  Negative for difficulty urinating, dysuria, flank pain, frequency, hematuria and urgency.  Musculoskeletal:  Negative for arthralgias, gait problem, joint swelling and myalgias.  Skin:  Negative for color change, rash and wound.  Neurological:  Negative for dizziness, tremors, speech difficulty, weakness, light-headedness, numbness and headaches.  Hematological:  Does not bruise/bleed easily.  Psychiatric/Behavioral:  Negative for agitation, confusion, decreased concentration, sleep disturbance and suicidal ideas.    Per HPI unless specifically indicated above     Objective:    BP 137/83    Pulse 78    Temp 97.7 F (36.5 C) (Oral)    Ht 5' 4.96" (1.65 m)    Wt 259 lb 3.2 oz (117.6 kg)    LMP 05/01/2013    SpO2 97%    BMI 43.18 kg/m   Wt Readings from Last 3 Encounters:  11/05/21 259 lb 3.2 oz (117.6 kg)  10/08/21 262 lb 3.2 oz (118.9 kg)  08/13/21 264 lb 3.2 oz (119.8 kg)    Physical Exam Vitals and nursing note reviewed.  Constitutional:      General: She is not in acute distress.    Appearance: Normal appearance. She is not ill-appearing or diaphoretic.  Eyes:     Conjunctiva/sclera: Conjunctivae normal.  Cardiovascular:     Rate and Rhythm: Normal rate and regular rhythm.  Pulmonary:     Effort: No respiratory distress.  Breath sounds: No stridor. No rhonchi.  Abdominal:     General: Abdomen is flat. Bowel sounds are normal. There is no distension.     Palpations: Abdomen is soft.     Tenderness: There is no abdominal tenderness. There is no guarding.  Skin:    General: Skin is warm and dry.     Coloration: Skin is not jaundiced.     Findings: No erythema.  Neurological:     Mental Status: She is alert.    Results for orders placed or performed in visit on 10/08/21  CBC with Differential/Platelet  Result Value Ref Range   WBC 6.4 3.4 - 10.8 x10E3/uL   RBC 6.06  (H) 3.77 - 5.28 x10E6/uL   Hemoglobin 16.9 (H) 11.1 - 15.9 g/dL   Hematocrit 52.5 (H) 34.0 - 46.6 %   MCV 87 79 - 97 fL   MCH 27.9 26.6 - 33.0 pg   MCHC 32.2 31.5 - 35.7 g/dL   RDW 13.5 11.7 - 15.4 %   Platelets 235 150 - 450 x10E3/uL   Neutrophils 47 Not Estab. %   Lymphs 38 Not Estab. %   Monocytes 10 Not Estab. %   Eos 4 Not Estab. %   Basos 1 Not Estab. %   Neutrophils Absolute 3.0 1.4 - 7.0 x10E3/uL   Lymphocytes Absolute 2.4 0.7 - 3.1 x10E3/uL   Monocytes Absolute 0.6 0.1 - 0.9 x10E3/uL   EOS (ABSOLUTE) 0.3 0.0 - 0.4 x10E3/uL   Basophils Absolute 0.1 0.0 - 0.2 x10E3/uL   Immature Granulocytes 0 Not Estab. %   Immature Grans (Abs) 0.0 0.0 - 0.1 x10E3/uL  Comprehensive metabolic panel  Result Value Ref Range   Glucose 104 (H) 70 - 99 mg/dL   BUN 20 8 - 27 mg/dL   Creatinine, Ser 0.79 0.57 - 1.00 mg/dL   eGFR 86 >59 mL/min/1.73   BUN/Creatinine Ratio 25 12 - 28   Sodium 141 134 - 144 mmol/L   Potassium 4.3 3.5 - 5.2 mmol/L   Chloride 102 96 - 106 mmol/L   CO2 23 20 - 29 mmol/L   Calcium 9.8 8.7 - 10.3 mg/dL   Total Protein 7.6 6.0 - 8.5 g/dL   Albumin 4.9 3.8 - 4.9 g/dL   Globulin, Total 2.7 1.5 - 4.5 g/dL   Albumin/Globulin Ratio 1.8 1.2 - 2.2   Bilirubin Total 0.4 0.0 - 1.2 mg/dL   Alkaline Phosphatase 62 44 - 121 IU/L   AST 32 0 - 40 IU/L   ALT 50 (H) 0 - 32 IU/L  TSH  Result Value Ref Range   TSH 2.170 0.450 - 4.500 uIU/mL  Urinalysis, Routine w reflex microscopic  Result Value Ref Range   Specific Gravity, UA 1.025 1.005 - 1.030   pH, UA 5.0 5.0 - 7.5   Color, UA Yellow Yellow   Appearance Ur Clear Clear   Leukocytes,UA Negative Negative   Protein,UA Negative Negative/Trace   Glucose, UA 3+ (A) Negative   Ketones, UA Negative Negative   RBC, UA Negative Negative   Bilirubin, UA Negative Negative   Urobilinogen, Ur 0.2 0.2 - 1.0 mg/dL   Nitrite, UA Negative Negative        Current Outpatient Medications:    dapagliflozin propanediol (FARXIGA) 10  MG TABS tablet, Take 1 tablet (10 mg total) by mouth daily every morning, Disp: 90 tablet, Rfl: 3   ezetimibe (ZETIA) 10 MG tablet, Take 1 tablet (10 mg total) by mouth daily., Disp: 90 tablet, Rfl:  3   glucose blood test strip, USE TO CHECK BLOOD SUGAR 2 TIMES A DAY., Disp: 200 strip, Rfl: 12   insulin degludec (TRESIBA FLEXTOUCH) 100 UNIT/ML FlexTouch Pen, Inject 28 Units into the skin daily., Disp: 15 mL, Rfl: 1   Insulin Pen Needle (UNIFINE PENTIPS) 32G X 4 MM MISC, USE DAILY AS INSTRUCTED, Disp: 100 each, Rfl: 1   Lancets (FREESTYLE) lancets, Use to check blood sugar 2 times a day., Disp: 200 each, Rfl: 12   Multiple Vitamin (MULTI-VITAMINS) TABS, Take by mouth., Disp: , Rfl:    nystatin-triamcinolone (MYCOLOG II) cream, Apply 1 application topically 2 (two) times daily. Use for 10-14 days, Disp: 30 g, Rfl: 2   Risankizumab-rzaa (SKYRIZI) 150 MG/ML SOSY, Inject 150 mg SQ every 12 weeks, Disp: 1 mL, Rfl: 4   Semaglutide, 1 MG/DOSE, (OZEMPIC, 1 MG/DOSE,) 4 MG/3ML SOPN, Inject 1 mg into the skin once a week., Disp: 9 mL, Rfl: 3   Vitamin D, Ergocalciferol, (DRISDOL) 1.25 MG (50000 UNIT) CAPS capsule, TAKE 1 CAPSULE BY MOUTH 2 TIMES A WEEK., Disp: 24 capsule, Rfl: 1    Assessment & Plan:   Dm: is on tresiba 24 units q pm and ozempic 1 mg once a week and farxiga 10 mg. check HbA1c,  urine  microalbumin  diabetic diet plan given to pt  adviced regarding hypoglycemia and instructions given to pt today on how to prevent and treat the same if it were to occur. pt acknowledges the plan and voices understanding of the same.  exercise plan given and encouraged.   advice diabetic yearly podiatry, ophthalmology , nutritionist , dental check q 6 months   2. Psoriasis is on skyrizi  Stable, chroinc, sees derm fu and mx per such    3. HLD: is on zetia for such  recheck FLP, check LFT's work on diet, SE of meds explained to pt. low fat and high fiber diet explained to pt. Declines statins.      4. Bells palsy :  Will need to correct HLD to prevent furtherneurological complications /  CVA   Problem List Items Addressed This Visit   None   Follow up plan: No follow-ups on file.

## 2021-11-06 LAB — LIPID PANEL
Chol/HDL Ratio: 4.2 ratio (ref 0.0–4.4)
Cholesterol, Total: 207 mg/dL — ABNORMAL HIGH (ref 100–199)
HDL: 49 mg/dL (ref >39)
LDL Chol Calc (NIH): 132 mg/dL — ABNORMAL HIGH (ref 0–99)
Triglycerides: 144 mg/dL (ref 0–149)
VLDL Cholesterol Cal: 26 mg/dL (ref 5–40)

## 2021-11-06 LAB — CBC WITH DIFFERENTIAL/PLATELET
Basophils Absolute: 0.1 10*3/uL (ref 0.0–0.2)
Basos: 1 %
EOS (ABSOLUTE): 0.3 10*3/uL (ref 0.0–0.4)
Eos: 6 %
Hematocrit: 48 % — ABNORMAL HIGH (ref 34.0–46.6)
Hemoglobin: 16 g/dL — ABNORMAL HIGH (ref 11.1–15.9)
Immature Grans (Abs): 0 10*3/uL (ref 0.0–0.1)
Immature Granulocytes: 0 %
Lymphocytes Absolute: 1.9 10*3/uL (ref 0.7–3.1)
Lymphs: 32 %
MCH: 28.6 pg (ref 26.6–33.0)
MCHC: 33.3 g/dL (ref 31.5–35.7)
MCV: 86 fL (ref 79–97)
Monocytes Absolute: 0.6 10*3/uL (ref 0.1–0.9)
Monocytes: 10 %
Neutrophils Absolute: 3.1 10*3/uL (ref 1.4–7.0)
Neutrophils: 51 %
Platelets: 253 10*3/uL (ref 150–450)
RBC: 5.59 x10E6/uL — ABNORMAL HIGH (ref 3.77–5.28)
RDW: 13.5 % (ref 11.7–15.4)
WBC: 6 10*3/uL (ref 3.4–10.8)

## 2021-11-12 ENCOUNTER — Other Ambulatory Visit (HOSPITAL_COMMUNITY): Payer: Self-pay

## 2021-11-23 ENCOUNTER — Other Ambulatory Visit: Payer: Self-pay | Admitting: Internal Medicine

## 2021-11-25 ENCOUNTER — Other Ambulatory Visit: Payer: Self-pay

## 2021-11-25 MED ORDER — TRESIBA FLEXTOUCH 100 UNIT/ML ~~LOC~~ SOPN
28.0000 [IU] | PEN_INJECTOR | Freq: Every day | SUBCUTANEOUS | 1 refills | Status: DC
  Filled 2021-11-25: qty 15, 53d supply, fill #0

## 2021-12-02 ENCOUNTER — Other Ambulatory Visit: Payer: Self-pay

## 2021-12-17 ENCOUNTER — Ambulatory Visit: Payer: 59 | Admitting: Internal Medicine

## 2021-12-17 ENCOUNTER — Other Ambulatory Visit: Payer: Self-pay

## 2021-12-17 ENCOUNTER — Encounter: Payer: Self-pay | Admitting: Internal Medicine

## 2021-12-17 VITALS — BP 121/79 | HR 79 | Temp 98.1°F | Ht 64.96 in | Wt 258.6 lb

## 2021-12-17 DIAGNOSIS — Z794 Long term (current) use of insulin: Secondary | ICD-10-CM

## 2021-12-17 DIAGNOSIS — E119 Type 2 diabetes mellitus without complications: Secondary | ICD-10-CM

## 2021-12-17 MED ORDER — SEMAGLUTIDE (2 MG/DOSE) 8 MG/3ML ~~LOC~~ SOPN
2.0000 mg | PEN_INJECTOR | SUBCUTANEOUS | 5 refills | Status: DC
  Filled 2021-12-17: qty 9, 84d supply, fill #0
  Filled 2022-04-15: qty 9, 84d supply, fill #1

## 2021-12-17 MED ORDER — TRESIBA FLEXTOUCH 100 UNIT/ML ~~LOC~~ SOPN
10.0000 [IU] | PEN_INJECTOR | Freq: Every day | SUBCUTANEOUS | 3 refills | Status: DC
  Filled 2021-12-17: qty 15, 150d supply, fill #0

## 2021-12-17 NOTE — Progress Notes (Signed)
BP 121/79    Pulse 79    Temp 98.1 F (36.7 C) (Oral)    Ht 5' 4.96" (1.65 m)    Wt 258 lb 9.6 oz (117.3 kg)    LMP 05/01/2013    SpO2 98%    BMI 43.08 kg/m    Subjective:    Patient ID: Cassandra Munoz, female    DOB: 1961/06/08, 61 y.o.   MRN: [id number]  Chief Complaint  Patient presents with   Diabetes    HPI: Cassandra Munoz is a 61 y.o. female  Diabetes She presents for her follow-up diabetic visit. She has type 2 diabetes mellitus. Her disease course has been improving. Pertinent negatives for hypoglycemia include no dizziness. Pertinent negatives for diabetes include no blurred vision, no chest pain, no fatigue, no foot paresthesias, no foot ulcerations, no polydipsia, no polyphagia, no polyuria, no visual change, no weakness and no weight loss.  Hyperlipidemia This is a chronic problem. The current episode started more than 1 year ago. The problem is controlled. Pertinent negatives include no chest pain.   Chief Complaint  Patient presents with   Diabetes    Relevant past medical, surgical, family and social history reviewed and updated as indicated. Interim medical history since our last visit reviewed. Allergies and medications reviewed and updated.  Review of Systems  Constitutional:  Negative for fatigue and weight loss.  Eyes:  Negative for blurred vision.  Cardiovascular:  Negative for chest pain.  Endocrine: Negative for polydipsia, polyphagia and polyuria.  Neurological:  Negative for dizziness and weakness.   Per HPI unless specifically indicated above     Objective:    BP 121/79    Pulse 79    Temp 98.1 F (36.7 C) (Oral)    Ht 5' 4.96" (1.65 m)    Wt 258 lb 9.6 oz (117.3 kg)    LMP 05/01/2013    SpO2 98%    BMI 43.08 kg/m   Wt Readings from Last 3 Encounters:  12/17/21 258 lb 9.6 oz (117.3 kg)  11/05/21 259 lb 3.2 oz (117.6 kg)  10/08/21 262 lb 3.2 oz (118.9 kg)    Physical Exam Vitals and nursing note reviewed.  Constitutional:      General:  She is not in acute distress.    Appearance: Normal appearance. She is not ill-appearing or diaphoretic.  Eyes:     Conjunctiva/sclera: Conjunctivae normal.  Pulmonary:     Breath sounds: No rhonchi.  Abdominal:     General: Abdomen is flat. Bowel sounds are normal. There is no distension.     Palpations: Abdomen is soft. There is no mass.     Tenderness: There is no abdominal tenderness. There is no guarding.  Skin:    General: Skin is warm and dry.     Coloration: Skin is not jaundiced.     Findings: No erythema.  Neurological:     Mental Status: She is alert.    Results for orders placed or performed in visit on 11/05/21  Bayer DCA Hb A1c Waived (STAT)  Result Value Ref Range   HB A1C (BAYER DCA - WAIVED) 7.0 (H) 4.8 - 5.6 %  CBC with Differential/Platelet  Result Value Ref Range   WBC 6.0 3.4 - 10.8 x10E3/uL   RBC 5.59 (H) 3.77 - 5.28 x10E6/uL   Hemoglobin 16.0 (H) 11.1 - 15.9 g/dL   Hematocrit 48.0 (H) 34.0 - 46.6 %   MCV 86 79 - 97 fL   MCH 28.6  26.6 - 33.0 pg   MCHC 33.3 31.5 - 35.7 g/dL   RDW 13.5 11.7 - 15.4 %   Platelets 253 150 - 450 x10E3/uL   Neutrophils 51 Not Estab. %   Lymphs 32 Not Estab. %   Monocytes 10 Not Estab. %   Eos 6 Not Estab. %   Basos 1 Not Estab. %   Neutrophils Absolute 3.1 1.4 - 7.0 x10E3/uL   Lymphocytes Absolute 1.9 0.7 - 3.1 x10E3/uL   Monocytes Absolute 0.6 0.1 - 0.9 x10E3/uL   EOS (ABSOLUTE) 0.3 0.0 - 0.4 x10E3/uL   Basophils Absolute 0.1 0.0 - 0.2 x10E3/uL   Immature Granulocytes 0 Not Estab. %   Immature Grans (Abs) 0.0 0.0 - 0.1 x10E3/uL  Microalbumin, Urine Waived  Result Value Ref Range   Microalb, Ur Waived 10 0 - 19 mg/L   Creatinine, Urine Waived 100 10 - 300 mg/dL   Microalb/Creat Ratio <30 <30 mg/g  Lipid panel  Result Value Ref Range   Cholesterol, Total 207 (H) 100 - 199 mg/dL   Triglycerides 144 0 - 149 mg/dL   HDL 49 >39 mg/dL   VLDL Cholesterol Cal 26 5 - 40 mg/dL   LDL Chol Calc (NIH) 132 (H) 0 - 99 mg/dL    Chol/HDL Ratio 4.2 0.0 - 4.4 ratio        Current Outpatient Medications:    dapagliflozin propanediol (FARXIGA) 10 MG TABS tablet, Take 1 tablet (10 mg total) by mouth daily every morning, Disp: 90 tablet, Rfl: 3   ezetimibe (ZETIA) 10 MG tablet, Take 1 tablet (10 mg total) by mouth daily., Disp: 90 tablet, Rfl: 3   Insulin Pen Needle (UNIFINE PENTIPS) 32G X 4 MM MISC, USE DAILY AS INSTRUCTED, Disp: 100 each, Rfl: 1   Lancets (FREESTYLE) lancets, Use to check blood sugar 2 times a day., Disp: 200 each, Rfl: 12   Multiple Vitamin (MULTI-VITAMINS) TABS, Take by mouth., Disp: , Rfl:    nystatin-triamcinolone (MYCOLOG II) cream, Apply 1 application topically 2 (two) times daily. Use for 10-14 days, Disp: 30 g, Rfl: 2   ondansetron (ZOFRAN) 8 MG tablet, Take 1 tablet (8 mg total) by mouth every 8 (eight) hours as needed for nausea or vomiting., Disp: 20 tablet, Rfl: 1   Risankizumab-rzaa (SKYRIZI) 150 MG/ML SOSY, Inject 150 mg SQ every 12 weeks, Disp: 1 mL, Rfl: 4   Semaglutide, 2 MG/DOSE, 8 MG/3ML SOPN, Inject 2 mg as directed once a week., Disp: 3 mL, Rfl: 5   Vitamin D, Ergocalciferol, (DRISDOL) 1.25 MG (50000 UNIT) CAPS capsule, TAKE 1 CAPSULE BY MOUTH 2 TIMES A WEEK., Disp: 24 capsule, Rfl: 1   insulin degludec (TRESIBA FLEXTOUCH) 100 UNIT/ML FlexTouch Pen, Inject 10 Units into the skin daily., Disp: 15 mL, Rfl: 3    Assessment & Plan:  DM cut back tresiba 10 units tonight x 1 week -- taper to 5 untis x next week and then stop. Fu with me x 6 weeks, farxiga 10 mg daily. Increase Ozempic 2 mg once a week takes zofran as needed and gasex check HbA1c,  urine  microalbumin  diabetic diet plan given to pt  adviced regarding hypoglycemia and instructions given to pt today on how to prevent and treat the same if it were to occur. pt acknowledges the plan and voices understanding of the same.  exercise plan given and encouraged.   advice diabetic yearly podiatry, ophthalmology , nutritionist , dental  check q 6 months A1c at 7.0 now  HLD LDL at 132 is on zetia 10 mg. recheck FLP, check LFT's work on diet, SE of meds explained to pt. low fat and high fiber diet explained to pt.  Problem List Items Addressed This Visit       Endocrine   Type 2 diabetes mellitus without complication, with long-term current use of insulin (HCC)   Relevant Medications   insulin degludec (TRESIBA FLEXTOUCH) 100 UNIT/ML FlexTouch Pen   Semaglutide, 2 MG/DOSE, 8 MG/3ML SOPN   Other Relevant Orders   CBC with Differential/Platelet   Comprehensive metabolic panel   Bayer DCA Hb A1c Waived (STAT)   Diabetes mellitus without complication (HCC) - Primary   Relevant Medications   insulin degludec (TRESIBA FLEXTOUCH) 100 UNIT/ML FlexTouch Pen   Semaglutide, 2 MG/DOSE, 8 MG/3ML SOPN   Other Relevant Orders   Bayer DCA Hb A1c Waived (STAT)     Orders Placed This Encounter  Procedures   CBC with Differential/Platelet   Comprehensive metabolic panel   Bayer DCA Hb A1c Waived (STAT)     Meds ordered this encounter  Medications   insulin degludec (TRESIBA FLEXTOUCH) 100 UNIT/ML FlexTouch Pen    Sig: Inject 10 Units into the skin daily.    Dispense:  15 mL    Refill:  3   Semaglutide, 2 MG/DOSE, 8 MG/3ML SOPN    Sig: Inject 2 mg as directed once a week.    Dispense:  3 mL    Refill:  5     Follow up plan: Return in about 6 weeks (around 01/28/2022).

## 2021-12-17 NOTE — Patient Instructions (Signed)
cut back tresiba 10 units tonight x 1 week -- taper to 5 untis x next week and then stop. Fu with me x 6 weeks, continue farxiga 10 mg daily. Increase Ozempic 2 mg once a week

## 2021-12-19 ENCOUNTER — Ambulatory Visit: Payer: 59 | Admitting: Internal Medicine

## 2021-12-23 ENCOUNTER — Other Ambulatory Visit: Payer: Self-pay

## 2021-12-23 MED FILL — Ergocalciferol Cap 1.25 MG (50000 Unit): ORAL | 84 days supply | Qty: 24 | Fill #1 | Status: AC

## 2022-01-06 ENCOUNTER — Other Ambulatory Visit: Payer: Self-pay

## 2022-01-07 ENCOUNTER — Other Ambulatory Visit: Payer: Self-pay

## 2022-01-07 DIAGNOSIS — J301 Allergic rhinitis due to pollen: Secondary | ICD-10-CM | POA: Diagnosis not present

## 2022-01-07 DIAGNOSIS — H9312 Tinnitus, left ear: Secondary | ICD-10-CM | POA: Diagnosis not present

## 2022-01-07 MED ORDER — AZELASTINE HCL 0.1 % NA SOLN
NASAL | 1 refills | Status: DC
  Filled 2022-01-07: qty 30, 30d supply, fill #0
  Filled 2022-08-28 – 2022-12-17 (×2): qty 30, 30d supply, fill #1

## 2022-01-21 ENCOUNTER — Other Ambulatory Visit (HOSPITAL_COMMUNITY): Payer: Self-pay

## 2022-01-21 ENCOUNTER — Other Ambulatory Visit: Payer: Self-pay

## 2022-01-27 ENCOUNTER — Other Ambulatory Visit (HOSPITAL_COMMUNITY): Payer: Self-pay

## 2022-01-28 ENCOUNTER — Ambulatory Visit: Payer: 59 | Admitting: Internal Medicine

## 2022-01-28 ENCOUNTER — Encounter: Payer: Self-pay | Admitting: Internal Medicine

## 2022-01-28 ENCOUNTER — Other Ambulatory Visit: Payer: Self-pay

## 2022-01-28 ENCOUNTER — Other Ambulatory Visit (HOSPITAL_COMMUNITY): Payer: Self-pay

## 2022-01-28 VITALS — BP 130/84 | HR 85 | Temp 97.9°F | Ht 64.96 in | Wt 254.8 lb

## 2022-01-28 DIAGNOSIS — E782 Mixed hyperlipidemia: Secondary | ICD-10-CM | POA: Diagnosis not present

## 2022-01-28 DIAGNOSIS — E119 Type 2 diabetes mellitus without complications: Secondary | ICD-10-CM

## 2022-01-28 DIAGNOSIS — Z794 Long term (current) use of insulin: Secondary | ICD-10-CM | POA: Diagnosis not present

## 2022-01-28 LAB — BAYER DCA HB A1C WAIVED: HB A1C (BAYER DCA - WAIVED): 6.3 % — ABNORMAL HIGH (ref 4.8–5.6)

## 2022-01-28 MED ORDER — EZETIMIBE 10 MG PO TABS
10.0000 mg | ORAL_TABLET | Freq: Every day | ORAL | 3 refills | Status: DC
  Filled 2022-01-28 – 2022-04-08 (×2): qty 90, 90d supply, fill #0
  Filled 2022-07-06: qty 90, 90d supply, fill #1
  Filled 2022-09-25: qty 90, 90d supply, fill #2
  Filled 2022-12-24: qty 90, 90d supply, fill #3

## 2022-01-28 MED ORDER — GLUCOSE BLOOD VI STRP
ORAL_STRIP | 12 refills | Status: AC
  Filled 2022-01-28: qty 200, 90d supply, fill #0
  Filled 2022-08-28: qty 200, 90d supply, fill #1

## 2022-01-28 NOTE — Progress Notes (Signed)
? ?BP 130/84   Pulse 85   Temp 97.9 ?F (36.6 ?C) (Oral)   Ht 5' 4.96" (1.65 m)   Wt 254 lb 12.8 oz (115.6 kg)   LMP 05/01/2013   SpO2 97%   BMI 42.45 kg/m?   ? ?Subjective:  ? ? Patient ID: Cassandra Munoz, female    DOB: [birth year], 61 y.o.   MRN: [id number] ? ?Chief Complaint  ?Patient presents with  ? Diabetes  ? Stye  ?  Right eye, started last Thursday ?  ? ? ?HPI: ?Cassandra Munoz is a 61 y.o. female ? ?Diabetes ?She presents for her follow-up (a1c much improved) diabetic visit. She has type 2 diabetes mellitus. Pertinent negatives for diabetes include no blurred vision, no chest pain, no fatigue, no foot paresthesias, no foot ulcerations, no polydipsia, no polyphagia and no polyuria.  ?Hyperlipidemia ?This is a chronic (ldl at goal) problem. The problem is controlled. Pertinent negatives include no chest pain.  ? ?Chief Complaint  ?Patient presents with  ? Diabetes  ? Stye  ?  Right eye, started last Thursday ?  ? ? ?Relevant past medical, surgical, family and social history reviewed and updated as indicated. Interim medical history since our last visit reviewed. ?Allergies and medications reviewed and updated. ? ?Review of Systems  ?Constitutional:  Negative for fatigue.  ?Eyes:  Negative for blurred vision.  ?Cardiovascular:  Negative for chest pain.  ?Endocrine: Negative for polydipsia, polyphagia and polyuria.  ? ?Per HPI unless specifically indicated above ? ?   ?Objective:  ?  ?BP 130/84   Pulse 85   Temp 97.9 ?F (36.6 ?C) (Oral)   Ht 5' 4.96" (1.65 m)   Wt 254 lb 12.8 oz (115.6 kg)   LMP 05/01/2013   SpO2 97%   BMI 42.45 kg/m?   ?Wt Readings from Last 3 Encounters:  ?01/28/22 254 lb 12.8 oz (115.6 kg)  ?12/17/21 258 lb 9.6 oz (117.3 kg)  ?11/05/21 259 lb 3.2 oz (117.6 kg)  ?  ?Physical Exam ? ?Results for orders placed or performed in visit on 12/17/21  ?Bayer DCA Hb A1c Waived (STAT)  ?Result Value Ref Range  ? HB A1C (BAYER DCA - WAIVED) 6.3 (H) 4.8 - 5.6 %  ? ?   ? ? ?Current Outpatient  Medications:  ?  azelastine (ASTELIN) 0.1 % nasal spray, One to two spray each nostril  q 12 prn  drainage, Disp: 30 mL, Rfl: 1 ?  dapagliflozin propanediol (FARXIGA) 10 MG TABS tablet, Take 1 tablet (10 mg total) by mouth daily every morning, Disp: 90 tablet, Rfl: 3 ?  Insulin Pen Needle (UNIFINE PENTIPS) 32G X 4 MM MISC, USE DAILY AS INSTRUCTED, Disp: 100 each, Rfl: 1 ?  Lancets (FREESTYLE) lancets, Use to check blood sugar 2 times a day., Disp: 200 each, Rfl: 12 ?  Multiple Vitamin (MULTI-VITAMINS) TABS, Take by mouth., Disp: , Rfl:  ?  nystatin-triamcinolone (MYCOLOG II) cream, Apply 1 application topically 2 (two) times daily. Use for 10-14 days, Disp: 30 g, Rfl: 2 ?  ondansetron (ZOFRAN) 8 MG tablet, Take 1 tablet (8 mg total) by mouth every 8 (eight) hours as needed for nausea or vomiting., Disp: 20 tablet, Rfl: 1 ?  Risankizumab-rzaa (SKYRIZI) 150 MG/ML SOSY, Inject 150 mg under the skin every 12 weeks, Disp: 1 mL, Rfl: 4 ?  Semaglutide, 2 MG/DOSE, 8 MG/3ML SOPN, Inject 2 mg as directed once a week., Disp: 3 mL, Rfl: 5 ?  Vitamin D, Ergocalciferol, (DRISDOL)  1.25 MG (50000 UNIT) CAPS capsule, TAKE 1 CAPSULE BY MOUTH 2 TIMES A WEEK., Disp: 24 capsule, Rfl: 1 ?  ezetimibe (ZETIA) 10 MG tablet, Take 1 tablet (10 mg total) by mouth daily., Disp: 90 tablet, Rfl: 3 ?  glucose blood test strip, USE TO CHECK BLOOD SUGAR 2 TIMES A DAY. Freestyle lite, Disp: 200 strip, Rfl: 12  ? ? ?Assessment & Plan:  ?DM stopped tresiba  ?FSBS - 131 --- lowest 104  ?Is on ozempic 2 mg q weekly and  farxiga 10 mg daily. ?takes zofran as needed and gasex ?check HbA1c,  urine  microalbumin  diabetic diet plan given to pt  adviced regarding hypoglycemia and instructions given to pt today on how to prevent and treat the same if it were to occur. pt acknowledges the plan and voices understanding of the same.  exercise plan given and encouraged.   advice diabetic yearly podiatry, ophthalmology , nutritionist , dental check q 6 months. ?   ?HLD LDL at 132 is on zetia 10 mg. Failed statins, ldl at 132 recheck next visit. ?recheck FLP, check LFT's work on diet, SE of meds explained to pt. low fat and high fiber diet explained to pt. ? ? ? ?Problem List Items Addressed This Visit   ? ?  ? Endocrine  ? Diabetes mellitus without complication ([place]) - Primary  ?  ? Other  ? Hyperlipidemia  ? Relevant Medications  ? ezetimibe (ZETIA) 10 MG tablet  ?  ? ?No orders of the defined types were placed in this encounter. ?  ? ?Meds ordered this encounter  ?Medications  ? ezetimibe (ZETIA) 10 MG tablet  ?  Sig: Take 1 tablet (10 mg total) by mouth daily.  ?  Dispense:  90 tablet  ?  Refill:  3  ? glucose blood test strip  ?  Sig: USE TO CHECK BLOOD SUGAR 2 TIMES A DAY. Freestyle lite  ?  Dispense:  200 strip  ?  Refill:  12  ?  ? ?Follow up plan: ?No follow-ups on file. ? ? ? ?

## 2022-01-29 LAB — CBC WITH DIFFERENTIAL/PLATELET
Basophils Absolute: 0.1 10*3/uL (ref 0.0–0.2)
Basos: 1 %
EOS (ABSOLUTE): 0.3 10*3/uL (ref 0.0–0.4)
Eos: 4 %
Hematocrit: 49.9 % — ABNORMAL HIGH (ref 34.0–46.6)
Hemoglobin: 16.2 g/dL — ABNORMAL HIGH (ref 11.1–15.9)
Immature Grans (Abs): 0 10*3/uL (ref 0.0–0.1)
Immature Granulocytes: 0 %
Lymphocytes Absolute: 2 10*3/uL (ref 0.7–3.1)
Lymphs: 30 %
MCH: 28.5 pg (ref 26.6–33.0)
MCHC: 32.5 g/dL (ref 31.5–35.7)
MCV: 88 fL (ref 79–97)
Monocytes Absolute: 0.6 10*3/uL (ref 0.1–0.9)
Monocytes: 8 %
Neutrophils Absolute: 3.7 10*3/uL (ref 1.4–7.0)
Neutrophils: 57 %
Platelets: 238 10*3/uL (ref 150–450)
RBC: 5.68 x10E6/uL — ABNORMAL HIGH (ref 3.77–5.28)
RDW: 14 % (ref 11.7–15.4)
WBC: 6.6 10*3/uL (ref 3.4–10.8)

## 2022-01-29 LAB — COMPREHENSIVE METABOLIC PANEL
ALT: 47 IU/L — ABNORMAL HIGH (ref 0–32)
AST: 29 IU/L (ref 0–40)
Albumin/Globulin Ratio: 1.4 (ref 1.2–2.2)
Albumin: 4.4 g/dL (ref 3.8–4.9)
Alkaline Phosphatase: 59 IU/L (ref 44–121)
BUN/Creatinine Ratio: 14 (ref 12–28)
BUN: 13 mg/dL (ref 8–27)
Bilirubin Total: 0.3 mg/dL (ref 0.0–1.2)
CO2: 21 mmol/L (ref 20–29)
Calcium: 9.4 mg/dL (ref 8.7–10.3)
Chloride: 104 mmol/L (ref 96–106)
Creatinine, Ser: 0.94 mg/dL (ref 0.57–1.00)
Globulin, Total: 3.1 g/dL (ref 1.5–4.5)
Glucose: 130 mg/dL — ABNORMAL HIGH (ref 70–99)
Potassium: 4.2 mmol/L (ref 3.5–5.2)
Sodium: 140 mmol/L (ref 134–144)
Total Protein: 7.5 g/dL (ref 6.0–8.5)
eGFR: 69 mL/min/{1.73_m2} (ref >59)

## 2022-01-29 LAB — LIPID PANEL
Chol/HDL Ratio: 3.7 ratio (ref 0.0–4.4)
Cholesterol, Total: 190 mg/dL (ref 100–199)
HDL: 52 mg/dL (ref >39)
LDL Chol Calc (NIH): 119 mg/dL — ABNORMAL HIGH (ref 0–99)
Triglycerides: 108 mg/dL (ref 0–149)
VLDL Cholesterol Cal: 19 mg/dL (ref 5–40)

## 2022-01-31 ENCOUNTER — Other Ambulatory Visit (HOSPITAL_COMMUNITY): Payer: Self-pay

## 2022-02-27 ENCOUNTER — Other Ambulatory Visit: Payer: Self-pay

## 2022-02-27 DIAGNOSIS — H00022 Hordeolum internum right lower eyelid: Secondary | ICD-10-CM | POA: Diagnosis not present

## 2022-02-27 MED ORDER — NEOMYCIN-POLYMYXIN-DEXAMETH 3.5-10000-0.1 OP SUSP
OPHTHALMIC | 0 refills | Status: DC
  Filled 2022-02-27: qty 5, 18d supply, fill #0

## 2022-02-27 MED ORDER — AZITHROMYCIN 250 MG PO TABS
ORAL_TABLET | ORAL | 0 refills | Status: DC
Start: 2022-02-27 — End: 2022-04-15
  Filled 2022-02-27: qty 6, 5d supply, fill #0

## 2022-03-06 ENCOUNTER — Other Ambulatory Visit: Payer: Self-pay

## 2022-03-06 DIAGNOSIS — D225 Melanocytic nevi of trunk: Secondary | ICD-10-CM | POA: Diagnosis not present

## 2022-03-06 DIAGNOSIS — L718 Other rosacea: Secondary | ICD-10-CM | POA: Diagnosis not present

## 2022-03-06 DIAGNOSIS — L293 Anogenital pruritus, unspecified: Secondary | ICD-10-CM | POA: Diagnosis not present

## 2022-03-06 DIAGNOSIS — L298 Other pruritus: Secondary | ICD-10-CM | POA: Diagnosis not present

## 2022-03-06 DIAGNOSIS — L400 Psoriasis vulgaris: Secondary | ICD-10-CM | POA: Diagnosis not present

## 2022-03-06 MED ORDER — TRIAMCINOLONE ACETONIDE 0.147 MG/GM EX AERS
INHALATION_SPRAY | CUTANEOUS | 3 refills | Status: AC
  Filled 2022-03-06: qty 100, 30d supply, fill #0
  Filled 2022-08-06: qty 100, 30d supply, fill #1

## 2022-03-06 MED ORDER — PRAMOXINE HCL 1 % EX LOTN
TOPICAL_LOTION | CUTANEOUS | 0 refills | Status: AC
  Filled 2022-03-06: qty 237, 30d supply, fill #0
  Filled 2022-08-28: qty 237, fill #0

## 2022-03-07 ENCOUNTER — Other Ambulatory Visit: Payer: Self-pay

## 2022-03-13 ENCOUNTER — Other Ambulatory Visit (HOSPITAL_COMMUNITY): Payer: Self-pay

## 2022-04-03 ENCOUNTER — Other Ambulatory Visit: Payer: Self-pay | Admitting: Internal Medicine

## 2022-04-03 ENCOUNTER — Other Ambulatory Visit: Payer: 59

## 2022-04-03 DIAGNOSIS — E1165 Type 2 diabetes mellitus with hyperglycemia: Secondary | ICD-10-CM

## 2022-04-04 ENCOUNTER — Other Ambulatory Visit: Payer: 59

## 2022-04-07 ENCOUNTER — Telehealth: Payer: Self-pay

## 2022-04-07 NOTE — Telephone Encounter (Signed)
Copied from [place] [id number]. Topic: General - Other >> Apr 04, 2022 11:28 AM [place] B wrote: Reason for CRM:pt called in states was at [facility] trying to have labs drawn and can't because pharmacy is unable to get it in [place], also shows order cancellled, not sure why, Please call back.

## 2022-04-08 ENCOUNTER — Other Ambulatory Visit: Payer: Self-pay

## 2022-04-09 ENCOUNTER — Telehealth: Payer: Self-pay

## 2022-04-09 NOTE — Telephone Encounter (Signed)
LVM with patient to switch her appt. Tomorrow to a virtual since Dr Neomia Dear will not be in office

## 2022-04-10 ENCOUNTER — Other Ambulatory Visit: Payer: Self-pay

## 2022-04-10 ENCOUNTER — Ambulatory Visit: Payer: 59 | Admitting: Internal Medicine

## 2022-04-10 ENCOUNTER — Other Ambulatory Visit: Payer: 59

## 2022-04-10 DIAGNOSIS — Z794 Long term (current) use of insulin: Secondary | ICD-10-CM

## 2022-04-10 DIAGNOSIS — E1165 Type 2 diabetes mellitus with hyperglycemia: Secondary | ICD-10-CM | POA: Diagnosis not present

## 2022-04-10 LAB — BAYER DCA HB A1C WAIVED: HB A1C (BAYER DCA - WAIVED): 6.7 % — ABNORMAL HIGH (ref 4.8–5.6)

## 2022-04-11 LAB — COMP. METABOLIC PANEL (14)
ALT: 52 IU/L — ABNORMAL HIGH (ref 0–32)
AST: 30 IU/L (ref 0–40)
Albumin/Globulin Ratio: 1.7 (ref 1.2–2.2)
Albumin: 4.4 g/dL (ref 3.8–4.9)
Alkaline Phosphatase: 58 IU/L (ref 44–121)
BUN/Creatinine Ratio: 22 (ref 12–28)
BUN: 16 mg/dL (ref 8–27)
Bilirubin Total: 0.3 mg/dL (ref 0.0–1.2)
CO2: 20 mmol/L (ref 20–29)
Calcium: 9.3 mg/dL (ref 8.7–10.3)
Chloride: 104 mmol/L (ref 96–106)
Creatinine, Ser: 0.72 mg/dL (ref 0.57–1.00)
Globulin, Total: 2.6 g/dL (ref 1.5–4.5)
Glucose: 111 mg/dL — ABNORMAL HIGH (ref 70–99)
Potassium: 4.5 mmol/L (ref 3.5–5.2)
Sodium: 140 mmol/L (ref 134–144)
Total Protein: 7 g/dL (ref 6.0–8.5)
eGFR: 96 mL/min/{1.73_m2} (ref >59)

## 2022-04-11 LAB — LIPID PANEL
Chol/HDL Ratio: 4 ratio (ref 0.0–4.4)
Cholesterol, Total: 183 mg/dL (ref 100–199)
HDL: 46 mg/dL (ref >39)
LDL Chol Calc (NIH): 114 mg/dL — ABNORMAL HIGH (ref 0–99)
Triglycerides: 131 mg/dL (ref 0–149)
VLDL Cholesterol Cal: 23 mg/dL (ref 5–40)

## 2022-04-15 ENCOUNTER — Other Ambulatory Visit (HOSPITAL_COMMUNITY): Payer: Self-pay

## 2022-04-15 ENCOUNTER — Encounter: Payer: Self-pay | Admitting: Internal Medicine

## 2022-04-15 ENCOUNTER — Ambulatory Visit: Payer: 59 | Admitting: Internal Medicine

## 2022-04-15 ENCOUNTER — Other Ambulatory Visit: Payer: Self-pay

## 2022-04-15 VITALS — BP 132/82 | HR 87 | Temp 97.9°F | Wt 256.0 lb

## 2022-04-15 DIAGNOSIS — E782 Mixed hyperlipidemia: Secondary | ICD-10-CM

## 2022-04-15 DIAGNOSIS — E119 Type 2 diabetes mellitus without complications: Secondary | ICD-10-CM

## 2022-04-15 NOTE — Progress Notes (Signed)
BP 132/82   Pulse 87   Temp 97.9 F (36.6 C) (Oral)   Wt 256 lb (116.1 kg)   LMP 05/01/2013   SpO2 98%   BMI 42.65 kg/m    Subjective:    Patient ID: Cassandra Munoz, female    DOB: 1961-09-14, 61 y.o.   MRN: [id number]  Chief Complaint  Patient presents with  . Hyperlipidemia    Follow up   . Diabetes    HPI: Cassandra Munoz is a 61 y.o. female  Pt is here for a folllow up,   Hyperlipidemia Pertinent negatives include no chest pain.  Diabetes She presents for her follow-up (a1c at 6.7) diabetic visit. She has type 2 (had tried the [place] device and found that trigged her sugars -- was icecream, highest sugars got to was 201 --- postprandial) diabetes mellitus. Her disease course has been improving. Pertinent negatives for diabetes include no blurred vision, no chest pain, no fatigue, no foot paresthesias, no foot ulcerations, no polydipsia, no polyphagia, no polyuria, no visual change, no weakness and no weight loss. Symptoms are stable.    Chief Complaint  Patient presents with  . Hyperlipidemia    Follow up   . Diabetes    Relevant past medical, surgical, family and social history reviewed and updated as indicated. Interim medical history since our last visit reviewed. Allergies and medications reviewed and updated.  Review of Systems  Constitutional:  Negative for fatigue and weight loss.  Eyes:  Negative for blurred vision.  Cardiovascular:  Negative for chest pain.  Endocrine: Negative for polydipsia, polyphagia and polyuria.  Neurological:  Negative for weakness.    Per HPI unless specifically indicated above     Objective:    BP 132/82   Pulse 87   Temp 97.9 F (36.6 C) (Oral)   Wt 256 lb (116.1 kg)   LMP 05/01/2013   SpO2 98%   BMI 42.65 kg/m   Wt Readings from Last 3 Encounters:  04/15/22 256 lb (116.1 kg)  01/28/22 254 lb 12.8 oz (115.6 kg)  12/17/21 258 lb 9.6 oz (117.3 kg)    Physical Exam Vitals and nursing note reviewed.   Constitutional:      General: She is not in acute distress.    Appearance: Normal appearance. She is not ill-appearing or diaphoretic.  Eyes:     Conjunctiva/sclera: Conjunctivae normal.  Cardiovascular:     Rate and Rhythm: Normal rate and regular rhythm.     Heart sounds: No murmur heard. Pulmonary:     Effort: No respiratory distress.     Breath sounds: No stridor. No wheezing or rhonchi.  Abdominal:     General: Abdomen is flat. Bowel sounds are normal. There is no distension.     Palpations: Abdomen is soft. There is no mass.     Tenderness: There is no abdominal tenderness. There is no guarding.     Hernia: No hernia is present.  Skin:    General: Skin is warm and dry.     Coloration: Skin is not jaundiced.     Findings: No erythema.  Neurological:     Mental Status: She is alert.    Results for orders placed or performed in visit on 04/10/22  Bayer DCA Hb A1c Waived  Result Value Ref Range   HB A1C (BAYER DCA - WAIVED) 6.7 (H) 4.8 - 5.6 %        Current Outpatient Medications:  .  azelastine (ASTELIN) 0.1 % nasal  spray, One to two spray each nostril  q 12 prn  drainage, Disp: 30 mL, Rfl: 1 .  dapagliflozin propanediol (FARXIGA) 10 MG TABS tablet, Take 1 tablet (10 mg total) by mouth daily every morning, Disp: 90 tablet, Rfl: 3 .  ezetimibe (ZETIA) 10 MG tablet, Take 1 tablet (10 mg total) by mouth daily., Disp: 90 tablet, Rfl: 3 .  glucose blood test strip, USE TO CHECK BLOOD SUGAR 2 TIMES A DAY. Freestyle lite, Disp: 200 strip, Rfl: 12 .  Insulin Pen Needle (UNIFINE PENTIPS) 32G X 4 MM MISC, USE DAILY AS INSTRUCTED, Disp: 100 each, Rfl: 1 .  Lancets (FREESTYLE) lancets, Use to check blood sugar 2 times a day., Disp: 200 each, Rfl: 12 .  Multiple Vitamin (MULTI-VITAMINS) TABS, Take by mouth., Disp: , Rfl:  .  neomycin-polymyxin b-dexamethasone (MAXITROL) 3.5-10000-0.1 SUSP, Instill 1 drop into left eye four times a day, Disp: 5 mL, Rfl: 0 .  nystatin-triamcinolone  (MYCOLOG II) cream, Apply 1 application topically 2 (two) times daily. Use for 10-14 days, Disp: 30 g, Rfl: 2 .  ondansetron (ZOFRAN) 8 MG tablet, Take 1 tablet (8 mg total) by mouth every 8 (eight) hours as needed for nausea or vomiting., Disp: 20 tablet, Rfl: 1 .  pramoxine (SARNA SENSITIVE) 1 % LOTN, Apply to affected areas once or twice daily as needed, Disp: 237 mL, Rfl: 0 .  Risankizumab-rzaa (SKYRIZI) 150 MG/ML SOSY, Inject 150 mg under the skin every 12 weeks, Disp: 1 mL, Rfl: 4 .  Semaglutide, 2 MG/DOSE, 8 MG/3ML SOPN, Inject 2 mg as directed once a week., Disp: 3 mL, Rfl: 5 .  triamcinolone (KENALOG) 0.147 MG/GM topical spray, Apply topically once or twice daily until clear then as needed, Disp: 100 g, Rfl: 3    Assessment & Plan:  DM is on farxiga and ozempic 2 mg.  check HbA1c,  urine  microalbumin  diabetic diet plan given to pt  adviced regarding hypoglycemia and instructions given to pt today on how to prevent and treat the same if it were to occur. pt acknowledges the plan and voices understanding of the same.  exercise plan given and encouraged.   advice diabetic yearly podiatry, ophthalmology , nutritionist , dental check q 6 months   HLD recheck FLP, check LFT's work on diet, SE of meds explained to pt. low fat and high fiber diet explained to pt.    Problem List Items Addressed This Visit       Endocrine   Diabetes mellitus without complication ([place]) - Primary     Other   Hyperlipidemia     No orders of the defined types were placed in this encounter.    No orders of the defined types were placed in this encounter.    Follow up plan: Return in about 3 months (around 07/16/2022).

## 2022-04-16 ENCOUNTER — Other Ambulatory Visit: Payer: Self-pay

## 2022-04-18 ENCOUNTER — Other Ambulatory Visit (HOSPITAL_COMMUNITY): Payer: Self-pay

## 2022-04-24 ENCOUNTER — Other Ambulatory Visit: Payer: Self-pay

## 2022-05-01 LAB — HM DIABETES EYE EXAM

## 2022-05-14 ENCOUNTER — Other Ambulatory Visit (HOSPITAL_COMMUNITY): Payer: Self-pay

## 2022-05-14 ENCOUNTER — Telehealth: Payer: Self-pay | Admitting: Pharmacist

## 2022-05-14 MED ORDER — SKYRIZI 150 MG/ML ~~LOC~~ SOSY: PREFILLED_SYRINGE | SUBCUTANEOUS | 4 refills | Status: DC

## 2022-05-14 NOTE — Telephone Encounter (Signed)
Called patient to schedule an appointment for the [facility]. I was unable to reach the patient so I left a HIPAA-compliant message requesting that the patient return my call.   Luke Van Ausdall, PharmD, BCACP, CPP Clinical Pharmacist [facility] [phone]  

## 2022-06-09 ENCOUNTER — Inpatient Hospital Stay
Admission: RE | Admit: 2022-06-09 | Discharge: 2022-06-09 | Disposition: A | Discharge status: Home / Self Care | Payer: Self-pay | Source: Ambulatory Visit | Attending: *Deleted | Admitting: *Deleted

## 2022-06-09 ENCOUNTER — Other Ambulatory Visit: Payer: Self-pay | Admitting: *Deleted

## 2022-06-09 DIAGNOSIS — Z1231 Encounter for screening mammogram for malignant neoplasm of breast: Secondary | ICD-10-CM

## 2022-06-10 ENCOUNTER — Telehealth: Payer: Self-pay

## 2022-06-10 DIAGNOSIS — R928 Other abnormal and inconclusive findings on diagnostic imaging of breast: Secondary | ICD-10-CM

## 2022-06-10 NOTE — Telephone Encounter (Signed)
Patient made aware of results and verbalized understanding.  

## 2022-06-10 NOTE — Telephone Encounter (Signed)
Copied from [place] [id number]. Topic: General - Other >> Jun 10, 2022 10:42 AM Ludger Nutting wrote: Patient called in requesting an order be sent to have a bilateral mammogram and ultrasound done on her breast. Patient states a spot was found that needs further evaluation. Please follow up with patient.

## 2022-06-10 NOTE — Telephone Encounter (Signed)
Spoke with patient about her request for a mammogram and u/s due to a spot that was found on 05/27/22 from a mammogram that was preformed by HER scan in [place]. Also spoke with [facility] and that stated that the orders that they need are B/L diag mammo and B/L breast u/s. Orders have been placed and will be sent to covering provider for since patient's pcp is no longer at this clinic.

## 2022-06-25 ENCOUNTER — Telehealth: Payer: Self-pay | Admitting: Family

## 2022-06-25 ENCOUNTER — Ambulatory Visit
Admission: RE | Admit: 2022-06-25 | Discharge: 2022-06-25 | Disposition: A | Discharge status: Home / Self Care | Payer: 59 | Source: Ambulatory Visit | Attending: Nurse Practitioner | Admitting: Nurse Practitioner

## 2022-06-25 DIAGNOSIS — R928 Other abnormal and inconclusive findings on diagnostic imaging of breast: Secondary | ICD-10-CM

## 2022-06-25 DIAGNOSIS — N6313 Unspecified lump in the right breast, lower outer quadrant: Secondary | ICD-10-CM | POA: Diagnosis not present

## 2022-06-25 DIAGNOSIS — N6311 Unspecified lump in the right breast, upper outer quadrant: Secondary | ICD-10-CM | POA: Diagnosis not present

## 2022-06-25 NOTE — Telephone Encounter (Signed)
Pt requesting an order for a biopsy on rt breast sent to [facility]   Pt states she was advise by [facility] to request order from provider  Please assist pt further

## 2022-06-25 NOTE — Progress Notes (Signed)
Contacted via MyChart   Good evening Cassandra Munoz, your mammogram results have returned and there was an area to right breast which they are recommending biopsy to, it looks like they have discussed these findings with you and will get you scheduled.  If you have any questions or concerns please let us know.  Have a great day.:)

## 2022-06-26 ENCOUNTER — Other Ambulatory Visit: Payer: Self-pay | Admitting: Nurse Practitioner

## 2022-06-26 DIAGNOSIS — R928 Other abnormal and inconclusive findings on diagnostic imaging of breast: Secondary | ICD-10-CM

## 2022-06-26 DIAGNOSIS — N63 Unspecified lump in unspecified breast: Secondary | ICD-10-CM

## 2022-06-26 NOTE — Telephone Encounter (Signed)
Orders have been placed per [place]. Just need to be signed off on.

## 2022-06-26 NOTE — Telephone Encounter (Signed)
See previous imaging results in chart from yesterday.

## 2022-07-04 ENCOUNTER — Other Ambulatory Visit (HOSPITAL_COMMUNITY): Payer: Self-pay

## 2022-07-04 ENCOUNTER — Ambulatory Visit: Payer: 59 | Attending: Internal Medicine | Admitting: Pharmacist

## 2022-07-04 ENCOUNTER — Other Ambulatory Visit: Payer: 59

## 2022-07-04 DIAGNOSIS — Z79899 Other long term (current) drug therapy: Secondary | ICD-10-CM

## 2022-07-04 MED ORDER — SKYRIZI 150 MG/ML ~~LOC~~ SOSY
PREFILLED_SYRINGE | SUBCUTANEOUS | 4 refills | Status: DC
  Filled 2022-07-04: qty 1, 84d supply, fill #0
  Filled 2022-09-25 – 2022-10-01 (×2): qty 1, 84d supply, fill #1

## 2022-07-04 NOTE — Progress Notes (Signed)
   S: Patient presents for review of their specialty medication therapy.  Patient is currently taking Skyrizi for psoriasis. Patient is managed by Dr. Kellie Moor for this.   Adherence: confirmed  Efficacy: reports that this works well for her   Dosing: Plaque psoriasis, moderate to severe: SubQ: 150 mg every 12 weeks.  Screening: TB test: completed per patient  Monitoring: S/sx of infection: denies S/sx of hypersensitivity/injection site reaction: none   O:     Lab Results  Component Value Date   WBC 6.6 01/28/2022   HGB 16.2 (H) 01/28/2022   HCT 49.9 (H) 01/28/2022   MCV 88 01/28/2022   PLT 238 01/28/2022      Chemistry      Component Value Date/Time   NA 140 04/10/2022 1005   K 4.5 04/10/2022 1005   CL 104 04/10/2022 1005   CO2 20 04/10/2022 1005   BUN 16 04/10/2022 1005   CREATININE 0.72 04/10/2022 1005   CREATININE 0.76 11/23/2017 1049      Component Value Date/Time   CALCIUM 9.3 04/10/2022 1005   ALKPHOS 58 04/10/2022 1005   AST 30 04/10/2022 1005   ALT 52 (H) 04/10/2022 1005   BILITOT 0.3 04/10/2022 1005       A/P: 1. Medication review: Patient currently on [place] for psoriasis. Reviewed the medication with the patient, including the following: Orson Ape is a monoclonal antibody used in the treatment of psoriasis. Patient educated on purpose, proper use and potential adverse effects of Skyrizi. Possible adverse effects are infections, headache, and injection site reactions. Live vaccinations should be avoided while on therapy. SubQ: Administer the two consecutive injections subcutaneously at different anatomic locations, such as thighs, abdomen, or back of upper arms. Do not inject into areas where the skin is tender, bruised, red, hard, or affected by psoriasis. Intended for use under supervision of a health care professional; self-injection may occur after proper training (except back of upper arms). No recommendations for any changes at this time.  Benard Halsted, PharmD, [place], [place] [phone]

## 2022-07-05 ENCOUNTER — Other Ambulatory Visit (HOSPITAL_COMMUNITY): Payer: Self-pay

## 2022-07-06 ENCOUNTER — Other Ambulatory Visit: Payer: Self-pay

## 2022-07-07 ENCOUNTER — Other Ambulatory Visit: Payer: Self-pay

## 2022-07-08 DIAGNOSIS — Z760 Encounter for issue of repeat prescription: Secondary | ICD-10-CM | POA: Diagnosis not present

## 2022-07-10 ENCOUNTER — Other Ambulatory Visit (HOSPITAL_COMMUNITY): Payer: Self-pay

## 2022-07-11 ENCOUNTER — Other Ambulatory Visit (HOSPITAL_COMMUNITY): Payer: Self-pay

## 2022-07-14 ENCOUNTER — Ambulatory Visit
Admission: RE | Admit: 2022-07-14 | Discharge: 2022-07-14 | Disposition: A | Discharge status: Home / Self Care | Payer: 59 | Source: Ambulatory Visit | Attending: Nurse Practitioner | Admitting: Nurse Practitioner

## 2022-07-14 ENCOUNTER — Other Ambulatory Visit (HOSPITAL_COMMUNITY): Payer: Self-pay

## 2022-07-14 DIAGNOSIS — R928 Other abnormal and inconclusive findings on diagnostic imaging of breast: Secondary | ICD-10-CM | POA: Diagnosis not present

## 2022-07-14 DIAGNOSIS — N63 Unspecified lump in unspecified breast: Secondary | ICD-10-CM | POA: Diagnosis not present

## 2022-07-14 DIAGNOSIS — D4861 Neoplasm of uncertain behavior of right breast: Secondary | ICD-10-CM | POA: Diagnosis not present

## 2022-07-14 DIAGNOSIS — N6311 Unspecified lump in the right breast, upper outer quadrant: Secondary | ICD-10-CM | POA: Diagnosis not present

## 2022-07-14 HISTORY — PX: BREAST BIOPSY: SHX20

## 2022-07-15 ENCOUNTER — Other Ambulatory Visit (HOSPITAL_COMMUNITY): Payer: Self-pay

## 2022-07-16 ENCOUNTER — Encounter: Payer: Self-pay | Admitting: *Deleted

## 2022-07-16 LAB — SURGICAL PATHOLOGY

## 2022-07-16 NOTE — Progress Notes (Signed)
Referral recieved from [facility] for benign breast mass.  Referral faxed to [facility] for Dr. Peyton Najjar per patient request.   No further needs at this time.

## 2022-07-22 ENCOUNTER — Other Ambulatory Visit: Payer: Self-pay

## 2022-07-22 ENCOUNTER — Ambulatory Visit: Payer: 59 | Admitting: Unknown Physician Specialty

## 2022-07-22 ENCOUNTER — Telehealth: Payer: 59 | Admitting: Physician Assistant

## 2022-07-22 ENCOUNTER — Encounter: Payer: Self-pay | Admitting: Physician Assistant

## 2022-07-22 DIAGNOSIS — E785 Hyperlipidemia, unspecified: Secondary | ICD-10-CM

## 2022-07-22 DIAGNOSIS — E1169 Type 2 diabetes mellitus with other specified complication: Secondary | ICD-10-CM

## 2022-07-22 DIAGNOSIS — E119 Type 2 diabetes mellitus without complications: Secondary | ICD-10-CM

## 2022-07-22 DIAGNOSIS — Z7984 Long term (current) use of oral hypoglycemic drugs: Secondary | ICD-10-CM | POA: Diagnosis not present

## 2022-07-22 MED ORDER — DAPAGLIFLOZIN PROPANEDIOL 10 MG PO TABS
10.0000 mg | ORAL_TABLET | Freq: Every day | ORAL | 3 refills | Status: DC
  Filled 2022-07-22: qty 90, 90d supply, fill #0
  Filled 2022-10-21: qty 90, 90d supply, fill #1
  Filled 2023-01-30: qty 90, 90d supply, fill #2
  Filled 2023-05-02: qty 90, 90d supply, fill #3

## 2022-07-22 NOTE — Progress Notes (Signed)
Virtual Visit via Video Note  I connected with Cassandra Munoz on 07/22/22 at  1:00 PM EDT by a video enabled telemedicine application and verified that I am speaking with the correct person using two identifiers.  Today's Provider: Talitha Givens, MHS, PA-C Introduced myself to the patient as a PA-C and provided education on APPs in clinical practice.   Location: Patient: at home, [place] Mun[place], [place] Provider: at home, [place], [place]    I discussed the limitations of evaluation and management by telemedicine and the availability of in person appointments. The patient expressed understanding and agreed to proceed.   Chief Complaint  Patient presents with   Diabetes    Eye exam at [facility] vision from 05/01/22 has been requested      History of Present Illness:  Diabetes, Type 2 - Last A1c 6.7  - Medications: Farxiga 10 mg, Semaglutide 2 mg - Compliance: excellent  - Checking BG at home: Checking every other day. In AM 110s and throughout day usually does not get higher than 160  - Diet: Overall normal diet, tries to refrain from excess sugar and cut out sodas - Exercise: walking her dog and throughout the neighborhood, almost every day  - Eye exam: She has had diabetic eye exam - pt reports they did not find evidence of retinopathy- results requested  - Foot exam: Next visit  - Microalbumin: next visit  - Statin: On zetia  - PNA vaccine:  - Denies symptoms of hypoglycemia, polyuria, polydipsia, numbness extremities, foot ulcers/trauma  HYPERLIPIDEMIA Hyperlipidemia status: good compliance Satisfied with current treatment?  yes Side effects:  no Medication compliance: excellent compliance Past cholesterol meds: ezetimide (zetia) Supplements: none Aspirin:  no The 10-year ASCVD risk score (Arnett DK, et al., 2019) is: 7.7%   Values used to calculate the score:     Age: 39 years     Sex: Female     Is Non-Hispanic African American: No     Diabetic: Yes     Tobacco smoker: No      Systolic Blood Pressure: 774 mmHg     Is BP treated: No     HDL Cholesterol: 46 mg/dL     Total Cholesterol: 183 mg/dL Chest pain:  no Coronary artery disease:  no Family history CAD:  yes Family history early CAD:  no    Review of Systems  Eyes:  Negative for blurred vision and double vision.  Cardiovascular:  Negative for chest pain and palpitations.  Gastrointestinal:  Negative for diarrhea, nausea and vomiting.  Genitourinary:  Negative for dysuria and frequency.  Neurological:  Negative for dizziness, tingling and headaches.  Endo/Heme/Allergies:  Negative for polydipsia.     Observations/Objective:  Due to the nature of the virtual visit, physical exam and observations are limited. Able to obtain the following observations:   Alert, oriented, Appears comfortable, in no acute distress.  No scleral injection, no appreciated hoarseness, tachypnea, wheeze or strider. Able to maintain conversation without visible strain.  No cough appreciated during visit.    Assessment and Plan:  Problem List Items Addressed This Visit       Endocrine   Hyperlipidemia associated with type 2 diabetes mellitus ([place])    Chronic, historic condition She is taking Zetia and appears to be tolerating well Reviewed most recent cholesterol levels- ASCVD is 7.7% Recommend she engage with diet and exercise efforts to help provide more control Follow up in 6 months for monitoring and labs       Relevant  Medications   dapagliflozin propanediol (FARXIGA) 10 MG TABS tablet   Diabetes mellitus without complication ([facility]) - Primary    Chronic, historic condition Most recent A1c was 6.7  She is taking Farxiga 10 mg PO QD and Semaglutide 2 mg IM 1x week She reports she is tolerating well without concerns for SE Will place orders for A1c recheck today for monitoring- results to dictate further management Recommend follow up in 3 months       Relevant Medications   dapagliflozin propanediol  (FARXIGA) 10 MG TABS tablet   Other Relevant Orders   Bayer DCA Hb A1c Waived (STAT)    Follow Up Instructions:    I discussed the assessment and treatment plan with the patient. The patient was provided an opportunity to ask questions and all were answered. The patient agreed with the plan and demonstrated an understanding of the instructions.   The patient was advised to call back or seek an in-person evaluation if the symptoms worsen or if the condition fails to improve as anticipated.  I provided 10 minutes of non-face-to-face time during this encounter.  Return in about 3 months (around 10/21/2022) for Diabetes follow up.   I, [name], PA-C, have reviewed all documentation for this visit. The documentation on 07/22/22 for the exam, diagnosis, procedures, and orders are all accurate and complete.   Talitha Givens, MHS, PA-C [facility]

## 2022-07-22 NOTE — Assessment & Plan Note (Signed)
Chronic, historic condition Most recent A1c was 6.7  She is taking Farxiga 10 mg PO QD and Semaglutide 2 mg IM 1x week She reports she is tolerating well without concerns for SE Will place orders for A1c recheck today for monitoring- results to dictate further management Recommend follow up in 3 months

## 2022-07-22 NOTE — Assessment & Plan Note (Signed)
Chronic, historic condition She is taking Zetia and appears to be tolerating well Reviewed most recent cholesterol levels- ASCVD is 7.7% Recommend she engage with diet and exercise efforts to help provide more control Follow up in 6 months for monitoring and labs

## 2022-07-25 ENCOUNTER — Other Ambulatory Visit: Payer: Self-pay | Admitting: General Surgery

## 2022-07-25 ENCOUNTER — Other Ambulatory Visit: Payer: Self-pay

## 2022-07-25 ENCOUNTER — Ambulatory Visit: Payer: Self-pay | Admitting: General Surgery

## 2022-07-25 DIAGNOSIS — D241 Benign neoplasm of right breast: Secondary | ICD-10-CM | POA: Diagnosis not present

## 2022-07-25 NOTE — H&P (View-Only) (Signed)
PATIENT PROFILE: Cassandra Munoz is a 61 y.o. female who presents to the Clinic for consultation at the request of Dr. Adline Peals for evaluation of right breast fibroepithelial perforation with keratosis mass.  PCP:  Cassandra Pacas, MD  HISTORY OF PRESENT ILLNESS: [name] reports she got her routine yearly breast ultrasound and she was found with a 9 mm mass.  This led to diagnostic mammogram that confirmed the complex 9 mm mass on the right breast at the 9 o'clock position.  She had a core needle biopsy that shows fibroepithelial proliferation with sclerosis with histologic features of intraductal papilloma unable to differentiate if the lesion has atypia or not.  Also differential diagnosis of complex fibroadenoma or complex grossing lesion.  It was recommended to proceed with excisional biopsy to exclude atypia.  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  Family history of breast cancer: 3 sister from that side with breast cancer Family history of other cancers: Father with colon, prostate.  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  GENERAL REVIEW OF SYSTEMS:   General ROS: negative for - chills, fatigue, fever, weight gain or weight loss Allergy and Immunology ROS: negative for - hives  Hematological and Lymphatic ROS: negative for - bleeding problems or bruising, negative for palpable nodes Endocrine ROS: negative for - heat or cold intolerance, hair changes Respiratory ROS: negative for - cough, shortness of breath or wheezing Cardiovascular ROS: no chest pain or palpitations GI ROS: negative for nausea, vomiting, abdominal pain, diarrhea,  constipation Musculoskeletal ROS: negative for - joint swelling or muscle pain Neurological ROS: negative for - confusion, syncope Dermatological ROS: negative for pruritus and rash Psychiatric: negative for anxiety, depression, difficulty sleeping and memory loss  MEDICATIONS: Current Outpatient Medications  Medication Sig Dispense Refill   azelastine (ASTELIN) 137 mcg nasal spray Place into one nostril as needed     dapagliflozin propanediol (FARXIGA) 10 mg tablet Take 10 mg by mouth once daily     ezetimibe (ZETIA) 10 mg tablet Take 1 tablet by mouth once daily     nystatin-triamcinolone cream Apply topically once daily as needed.     ondansetron (ZOFRAN) 8 MG tablet Take 8 mg by mouth every 8 (eight) hours as needed     semaglutide (OZEMPIC) 2 mg/dose (8 mg/3 mL) pen injector Inject 2 mg subcutaneously once a week     SKYRIZI 150 mg/mL subcutaneous inj syringe 1 shot every 12 weeks     triamcinolone (KENALOG) 0.147 mg/gram topical spray Apply topically as needed     apremilast (OTEZLA) 30 mg tablet Take by mouth 2 (two) times daily. (Patient not taking: Reported on 07/25/2022)     canagliflozin (INVOKANA) 100 mg tablet TAKE 1 TABLET BY MOUTH DAILY (Patient not taking: Reported on 07/25/2022)     ergocalciferol, vitamin D2, 50,000 unit capsule Take by mouth twice a week. (Patient not taking: Reported on 07/25/2022)     fluconazole (DIFLUCAN) 150 MG tablet Take by mouth once daily as needed. (Patient not taking: Reported on 07/25/2022)     insulin GLARGINE (LANTUS SOLOSTAR, BASAGLAR KWIKPEN) 100 unit/mL pen injector Inject 20 Units subcutaneously nightly. (Patient not taking: Reported on 07/25/2022)     metFORMIN (GLUCOPHAGE-XR) 500  MG XR tablet TAKE 4 TABLETS (2,000 MG TOTAL) BY MOUTH DAILY WITH SUPPER. (Patient not taking: Reported on 07/25/2022)     MULTIVITAMIN ORAL Take by mouth once daily. (Patient not taking: Reported on 07/25/2022)     No current facility-administered medications for this  visit.    ALLERGIES: Clobetasol and Codeine  PAST MEDICAL HISTORY: Past Medical History:  Diagnosis Date   Diabetes mellitus type 2 in obese (CMS-HCC)    Psoriasis     PAST SURGICAL HISTORY: Past Surgical History:  Procedure Laterality Date   ankle surgery Right 2012   lipoma   ABDOMINAL HYSTERECTOMY  2016   COLONOSCOPY  04/03/2016   [facility] (Father/Sister): CBF 03/2021     FAMILY HISTORY: Family History  Problem Relation Age of Onset   No Known Problems Mother    Colon cancer Father    Prostate cancer Father    Myocardial Infarction (Heart attack) Father    Colon cancer Sister    Bladder Cancer Brother    Diabetes Brother    Pacemaker Brother        after having flu     SOCIAL HISTORY: Social History   Socioeconomic History   Marital status: Married  Tobacco Use   Smoking status: Never    Passive exposure: Never   Smokeless tobacco: Never  Vaping Use   Vaping Use: Never used  Substance and Sexual Activity   Alcohol use: Never   Drug use: Never    PHYSICAL EXAM: Vitals:   07/25/22 0945  BP: (!) 148/91  Pulse: 81   Body mass index is 42.43 kg/m. Weight: (!) 115.7 kg (255 lb)   GENERAL: Alert, active, oriented x3  HEENT: Pupils equal reactive to light. Extraocular movements are intact. Sclera clear. Palpebral conjunctiva normal red color.Pharynx clear.  NECK: Supple with no palpable mass and no adenopathy.  LUNGS: Sound clear with no rales rhonchi or wheezes.  HEART: Regular rhythm S1 and S2 without murmur.  BREAST: breasts appear normal, no suspicious masses, no skin or nipple changes or axillary nodes.  ABDOMEN: Soft and depressible, nontender with no palpable mass, no hepatomegaly.  EXTREMITIES: Well-developed well-nourished symmetrical with no dependent edema.  NEUROLOGICAL: Awake alert oriented, facial expression symmetrical, moving all extremities.  REVIEW OF DATA: I have reviewed the following data today: No visits with results  within 3 Month(s) from this visit.  Latest known visit with results is:  Orders Only on 09/28/2006  Component Date Value   Gram Stain Report 09/28/2006                     Value:GRAM STAIN:                         4+ GRAM POSITIVE COCCI IN CLUSTERS                         RARE WHITE BLOOD CELLS   REPORT 09/28/2006                     Value:* CULTURE FINAL REPORT *                         4+ STAPHYLOCOCCUS AUREUS , METHICILLIN SUSCEPTIBLE   MICROCE 09/28/2006 [id number]    MICROCE 09/28/2006 [id number]    CULTPOSNEG 09/28/2006 POS    Organism 09/28/2006 [id number]      ASSESSMENT: Ms. Delia is  a 61 y.o. female presenting for consultation for fibroepithelial proliferation with sclerosis and intraductal papilloma.    Patient was oriented again about the pathology results.  She was oriented about the results of fibroepithelial peripheral lesion with sclerosis.  She was also oriented about the focal atypical ductal hyperplasia, the histologic features of intraductal papilloma and the fact that he was unable to exclude atypia.  With these findings excisional biopsy of the lesion is recommended.  Surgical alternatives were discussed with patient including excisional biopsy. Surgical technique and post operative care was discussed with patient. Risk of surgery was discussed with patient including but not limited to: wound infection, seroma, hematoma, brachial plexopathy, mondor's disease (thrombosis of small veins of breast), chronic wound pain, breast lymphedema, altered sensation to the nipple and cosmesis among others.   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  Right breast radiofrequency tag guided excisional biopsy (19125) 2.  CBC, CMP 3.  Avoid taking aspirin 5 days before the procedure 4.  Contact us if you have any concern  Patient verbalized understanding, all questions were answered, and were agreeable with the plan outlined above.     Herbert Pun,  MD  Electronically signed by Herbert Pun, MD

## 2022-07-25 NOTE — H&P (Signed)
PATIENT PROFILE: Drema Eddington is a 61 y.o. female who presents to the Clinic for consultation at the request of Dr. Adline Peals for evaluation of right breast fibroepithelial perforation with keratosis mass.  PCP:  Sharlyne Pacas, MD  HISTORY OF PRESENT ILLNESS: Ms. Hanaway reports she got her routine yearly breast ultrasound and she was found with a 9 mm mass.  This led to diagnostic mammogram that confirmed the complex 9 mm mass on the right breast at the 9 o'clock position.  She had a core needle biopsy that shows fibroepithelial proliferation with sclerosis with histologic features of intraductal papilloma unable to differentiate if the lesion has atypia or not.  Also differential diagnosis of complex fibroadenoma or complex grossing lesion.  It was recommended to proceed with excisional biopsy to exclude atypia.  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  Family history of breast cancer: 3 sister from that side with breast cancer Family history of other cancers: Father with colon, prostate.  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  GENERAL REVIEW OF SYSTEMS:   General ROS: negative for - chills, fatigue, fever, weight gain or weight loss Allergy and Immunology ROS: negative for - hives  Hematological and Lymphatic ROS: negative for - bleeding problems or bruising, negative for palpable nodes Endocrine ROS: negative for - heat or cold intolerance, hair changes Respiratory ROS: negative for - cough, shortness of breath or wheezing Cardiovascular ROS: no chest pain or palpitations GI ROS: negative for nausea, vomiting, abdominal pain, diarrhea,  constipation Musculoskeletal ROS: negative for - joint swelling or muscle pain Neurological ROS: negative for - confusion, syncope Dermatological ROS: negative for pruritus and rash Psychiatric: negative for anxiety, depression, difficulty sleeping and memory loss  MEDICATIONS: Current Outpatient Medications  Medication Sig Dispense Refill   azelastine (ASTELIN) 137 mcg nasal spray Place into one nostril as needed     dapagliflozin propanediol (FARXIGA) 10 mg tablet Take 10 mg by mouth once daily     ezetimibe (ZETIA) 10 mg tablet Take 1 tablet by mouth once daily     nystatin-triamcinolone cream Apply topically once daily as needed.     ondansetron (ZOFRAN) 8 MG tablet Take 8 mg by mouth every 8 (eight) hours as needed     semaglutide (OZEMPIC) 2 mg/dose (8 mg/3 mL) pen injector Inject 2 mg subcutaneously once a week     SKYRIZI 150 mg/mL subcutaneous inj syringe 1 shot every 12 weeks     triamcinolone (KENALOG) 0.147 mg/gram topical spray Apply topically as needed     apremilast (OTEZLA) 30 mg tablet Take by mouth 2 (two) times daily. (Patient not taking: Reported on 07/25/2022)     canagliflozin (INVOKANA) 100 mg tablet TAKE 1 TABLET BY MOUTH DAILY (Patient not taking: Reported on 07/25/2022)     ergocalciferol, vitamin D2, 50,000 unit capsule Take by mouth twice a week. (Patient not taking: Reported on 07/25/2022)     fluconazole (DIFLUCAN) 150 MG tablet Take by mouth once daily as needed. (Patient not taking: Reported on 07/25/2022)     insulin GLARGINE (LANTUS SOLOSTAR, BASAGLAR KWIKPEN) 100 unit/mL pen injector Inject 20 Units subcutaneously nightly. (Patient not taking: Reported on 07/25/2022)     metFORMIN (GLUCOPHAGE-XR) 500  MG XR tablet TAKE 4 TABLETS (2,000 MG TOTAL) BY MOUTH DAILY WITH SUPPER. (Patient not taking: Reported on 07/25/2022)     MULTIVITAMIN ORAL Take by mouth once daily. (Patient not taking: Reported on 07/25/2022)     No current facility-administered medications for this  visit.    ALLERGIES: Clobetasol and Codeine  PAST MEDICAL HISTORY: Past Medical History:  Diagnosis Date   Diabetes mellitus type 2 in obese (CMS-HCC)    Psoriasis     PAST SURGICAL HISTORY: Past Surgical History:  Procedure Laterality Date   ankle surgery Right 2012   lipoma   ABDOMINAL HYSTERECTOMY  2016   COLONOSCOPY  04/03/2016   [facility] (Father/Sister): CBF 03/2021     FAMILY HISTORY: Family History  Problem Relation Age of Onset   No Known Problems Mother    Colon cancer Father    Prostate cancer Father    Myocardial Infarction (Heart attack) Father    Colon cancer Sister    Bladder Cancer Brother    Diabetes Brother    Pacemaker Brother        after having flu     SOCIAL HISTORY: Social History   Socioeconomic History   Marital status: Married  Tobacco Use   Smoking status: Never    Passive exposure: Never   Smokeless tobacco: Never  Vaping Use   Vaping Use: Never used  Substance and Sexual Activity   Alcohol use: Never   Drug use: Never    PHYSICAL EXAM: Vitals:   07/25/22 0945  BP: (!) 148/91  Pulse: 81   Body mass index is 42.43 kg/m. Weight: (!) 115.7 kg (255 lb)   GENERAL: Alert, active, oriented x3  HEENT: Pupils equal reactive to light. Extraocular movements are intact. Sclera clear. Palpebral conjunctiva normal red color.Pharynx clear.  NECK: Supple with no palpable mass and no adenopathy.  LUNGS: Sound clear with no rales rhonchi or wheezes.  HEART: Regular rhythm S1 and S2 without murmur.  BREAST: breasts appear normal, no suspicious masses, no skin or nipple changes or axillary nodes.  ABDOMEN: Soft and depressible, nontender with no palpable mass, no hepatomegaly.  EXTREMITIES: Well-developed well-nourished symmetrical with no dependent edema.  NEUROLOGICAL: Awake alert oriented, facial expression symmetrical, moving all extremities.  REVIEW OF DATA: I have reviewed the following data today: No visits with results  within 3 Month(s) from this visit.  Latest known visit with results is:  Orders Only on 09/28/2006  Component Date Value   Gram Stain Report 09/28/2006                     Value:GRAM STAIN:                         4+ GRAM POSITIVE COCCI IN CLUSTERS                         RARE WHITE BLOOD CELLS   REPORT 09/28/2006                     Value:* CULTURE FINAL REPORT *                         4+ STAPHYLOCOCCUS AUREUS , METHICILLIN SUSCEPTIBLE   MICROCE 09/28/2006 [id number]    MICROCE 09/28/2006 [id number]    CULTPOSNEG 09/28/2006 POS    Organism 09/28/2006 [id number]      ASSESSMENT: Ms. Tout is  a 61 y.o. female presenting for consultation for fibroepithelial proliferation with sclerosis and intraductal papilloma.    Patient was oriented again about the pathology results.  She was oriented about the results of fibroepithelial peripheral lesion with sclerosis.  She was also oriented about the focal atypical ductal hyperplasia, the histologic features of intraductal papilloma and the fact that he was unable to exclude atypia.  With these findings excisional biopsy of the lesion is recommended.  Surgical alternatives were discussed with patient including excisional biopsy. Surgical technique and post operative care was discussed with patient. Risk of surgery was discussed with patient including but not limited to: wound infection, seroma, hematoma, brachial plexopathy, mondor's disease (thrombosis of small veins of breast), chronic wound pain, breast lymphedema, altered sensation to the nipple and cosmesis among others.   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  Right breast radiofrequency tag guided excisional biopsy (19125) 2.  CBC, CMP 3.  Avoid taking aspirin 5 days before the procedure 4.  Contact us if you have any concern  Patient verbalized understanding, all questions were answered, and were agreeable with the plan outlined above.     Herbert Pun,  MD  Electronically signed by Herbert Pun, MD

## 2022-07-29 ENCOUNTER — Encounter: Payer: Self-pay | Admitting: Nurse Practitioner

## 2022-07-29 ENCOUNTER — Other Ambulatory Visit: Payer: Self-pay | Admitting: Physician Assistant

## 2022-07-29 ENCOUNTER — Telehealth: Payer: Self-pay | Admitting: Nurse Practitioner

## 2022-07-29 ENCOUNTER — Other Ambulatory Visit: Payer: Self-pay

## 2022-07-29 DIAGNOSIS — E119 Type 2 diabetes mellitus without complications: Secondary | ICD-10-CM

## 2022-07-29 MED FILL — Semaglutide Soln Pen-inj 2 MG/DOSE (8 MG/3ML): SUBCUTANEOUS | 28 days supply | Qty: 3 | Fill #0 | Status: AC

## 2022-07-29 NOTE — Telephone Encounter (Signed)
Requested Prescriptions  Pending Prescriptions Disp Refills  . OZEMPIC, 2 MG/DOSE, 8 MG/3ML SOPN [Pharmacy Med Name: Semaglutide, 2 MG/DOSE, 8 MG/3ML Solution Pen-injector] 3 mL 3    Sig: Inject 2 mg as directed once a week.     Endocrinology:  Diabetes - GLP-1 Receptor Agonists - semaglutide Failed - 07/29/2022 11:21 AM      Failed - HBA1C in normal range and within 180 days    Hemoglobin A1C  Date Value Ref Range Status  11/18/2018 6.6  Final   HB A1C (BAYER DCA - WAIVED)  Date Value Ref Range Status  04/10/2022 6.7 (H) 4.8 - 5.6 % Final    Comment:             Prediabetes: 5.7 - 6.4          Diabetes: >6.4          Glycemic control for adults with diabetes: <7.0          Passed - Cr in normal range and within 360 days    Creat  Date Value Ref Range Status  11/23/2017 0.76 0.50 - 1.05 mg/dL Final    Comment:    For patients >17 years of age, the reference limit for Creatinine is approximately 13% higher for people identified as African-American. .    Creatinine, Ser  Date Value Ref Range Status  04/10/2022 0.72 0.57 - 1.00 mg/dL Final   Creatinine,U  Date Value Ref Range Status  11/23/2020 80.5 mg/dL Final         Passed - Valid encounter within last 6 months    Recent Outpatient Visits          1 week ago Diabetes mellitus without complication ([place])   Moody, Erin E, PA-C   3 weeks ago Encounter for medication review   [place], Stephen L, RPH-CPP   3 months ago Diabetes mellitus without complication ([place])   [facility] Vigg, Avanti, MD   6 months ago Diabetes mellitus without complication ([place])   [facility] Vigg, Avanti, MD   7 months ago Diabetes mellitus without complication ([place])   Kenneth, MD      Future Appointments            In 2 months Johnson, Barb Merino, DO [place], [facility]

## 2022-07-29 NOTE — Telephone Encounter (Signed)
Copied from [place] [id number]. Topic: General - Other >> Jul 28, 2022  3:56 PM Sabas Sous wrote: Reason for CRM: Pt called regarding her ozempic request that was sent from the pharmacy last week, she also needs lab orders for her appt this Thursday

## 2022-07-29 NOTE — Telephone Encounter (Signed)
Spoke with patient to let her know order was placed for labs, pt verbalized understanding. Pt states she will call pharmacy for ozempic

## 2022-07-30 ENCOUNTER — Other Ambulatory Visit: Payer: Self-pay

## 2022-07-31 ENCOUNTER — Other Ambulatory Visit: Payer: 59

## 2022-07-31 ENCOUNTER — Telehealth: Payer: Self-pay

## 2022-07-31 ENCOUNTER — Other Ambulatory Visit: Payer: Self-pay | Admitting: Physician Assistant

## 2022-07-31 DIAGNOSIS — E119 Type 2 diabetes mellitus without complications: Secondary | ICD-10-CM

## 2022-07-31 NOTE — Telephone Encounter (Signed)
Called patient and informed her that when she was in earlier today for her lab work they had a problem with the sample that she gave to the lab. Asked if she could come back in for a redraw. Patient was notably upset that this happened. 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 Lab was re ordered and patient was made aware.

## 2022-08-01 ENCOUNTER — Other Ambulatory Visit
Admission: RE | Admit: 2022-08-01 | Discharge: 2022-08-01 | Disposition: A | Discharge status: Home / Self Care | Payer: 59 | Attending: Physician Assistant | Admitting: Physician Assistant

## 2022-08-01 DIAGNOSIS — E119 Type 2 diabetes mellitus without complications: Secondary | ICD-10-CM | POA: Insufficient documentation

## 2022-08-01 LAB — CBC WITH DIFFERENTIAL/PLATELET
Abs Immature Granulocytes: 0.02 10*3/uL (ref 0.00–0.07)
Basophils Absolute: 0.1 10*3/uL (ref 0.0–0.1)
Basophils Relative: 1 %
Eosinophils Absolute: 0.3 10*3/uL (ref 0.0–0.5)
Eosinophils Relative: 4 %
HCT: 46 % (ref 36.0–46.0)
Hemoglobin: 15.3 g/dL — ABNORMAL HIGH (ref 12.0–15.0)
Immature Granulocytes: 0 %
Lymphocytes Relative: 28 %
Lymphs Abs: 1.9 10*3/uL (ref 0.7–4.0)
MCH: 28.3 pg (ref 26.0–34.0)
MCHC: 33.3 g/dL (ref 30.0–36.0)
MCV: 85 fL (ref 80.0–100.0)
Monocytes Absolute: 0.6 10*3/uL (ref 0.1–1.0)
Monocytes Relative: 9 %
Neutro Abs: 3.9 10*3/uL (ref 1.7–7.7)
Neutrophils Relative %: 58 %
Platelets: 240 10*3/uL (ref 150–400)
RBC: 5.41 MIL/uL — ABNORMAL HIGH (ref 3.87–5.11)
RDW: 14 % (ref 11.5–15.5)
WBC: 6.8 10*3/uL (ref 4.0–10.5)
nRBC: 0 % (ref 0.0–0.2)

## 2022-08-01 LAB — COMPREHENSIVE METABOLIC PANEL
ALT: 62 U/L — ABNORMAL HIGH (ref 0–44)
AST: 37 U/L (ref 15–41)
Albumin: 4.1 g/dL (ref 3.5–5.0)
Alkaline Phosphatase: 45 U/L (ref 38–126)
Anion gap: 3 — ABNORMAL LOW (ref 5–15)
BUN: 17 mg/dL (ref 8–23)
CO2: 26 mmol/L (ref 22–32)
Calcium: 9 mg/dL (ref 8.9–10.3)
Chloride: 107 mmol/L (ref 98–111)
Creatinine, Ser: 0.75 mg/dL (ref 0.44–1.00)
GFR, Estimated: >60 mL/min (ref >60)
Glucose, Bld: 126 mg/dL — ABNORMAL HIGH (ref 70–99)
Potassium: 4 mmol/L (ref 3.5–5.1)
Sodium: 136 mmol/L (ref 135–145)
Total Bilirubin: 0.5 mg/dL (ref 0.3–1.2)
Total Protein: 7.7 g/dL (ref 6.5–8.1)

## 2022-08-01 LAB — HEMOGLOBIN A1C
Hgb A1c MFr Bld: 6.8 % — ABNORMAL HIGH (ref 4.8–5.6)
Mean Plasma Glucose: 148.46 mg/dL

## 2022-08-01 NOTE — Addendum Note (Signed)
Addended by: Antonieta Iba C on: 08/01/2022 08:18 AM   Modules accepted: Orders

## 2022-08-07 ENCOUNTER — Other Ambulatory Visit: Payer: Self-pay

## 2022-08-08 ENCOUNTER — Other Ambulatory Visit: Payer: Self-pay

## 2022-08-11 ENCOUNTER — Encounter
Admission: RE | Admit: 2022-08-11 | Discharge: 2022-08-11 | Disposition: A | Discharge status: Home / Self Care | Payer: 59 | Source: Ambulatory Visit | Attending: General Surgery | Admitting: General Surgery

## 2022-08-11 VITALS — Ht 65.0 in | Wt 252.0 lb

## 2022-08-11 DIAGNOSIS — Z794 Long term (current) use of insulin: Secondary | ICD-10-CM

## 2022-08-11 DIAGNOSIS — E119 Type 2 diabetes mellitus without complications: Secondary | ICD-10-CM

## 2022-08-11 NOTE — Patient Instructions (Addendum)
Your procedure is scheduled on: Wednesday August 20, 2022. Report to Day Surgery inside [facility] 2nd floor, stop by registration desk before getting on elevator.  To find out your arrival time please call [phone] between 1PM - 3PM on Tuesday August 19, 2022.  Remember: Instructions that are not followed completely may result in serious medical risk,  up to and including death, or upon the discretion of your surgeon and anesthesiologist your  surgery may need to be rescheduled.     _X__ 1. Do not eat food or drink fluids after midnight the night before your procedure.                 No chewing gum or hard candies.   __X__2.  On the morning of surgery brush your teeth with toothpaste and water, you                may rinse your mouth with mouthwash if you wish.  Do not swallow any toothpaste or mouthwash.     _X__ 3.  No Alcohol for 24 hours before or after surgery.   _X__ 4.  Do Not Smoke or use e-cigarettes For 24 Hours Prior to Your Surgery.                 Do not use any chewable tobacco products for at least 6 hours prior to                 Surgery.  _X__  5.  Do not use any recreational drugs (marijuana, cocaine, heroin, ecstasy, MDMA or other)                For at least one week prior to your surgery.  Combination of these drugs with anesthesia                May have life threatening results.  ____  6.  Bring all medications with you on the day of surgery if instructed.   __X__  7.  Notify your doctor if there is any change in your medical condition      (cold, fever, infections).     Do not wear jewelry, make-up, hairpins, clips or nail polish. Do not wear lotions, powders, or perfumes or deodorant. Do not shave 48 hours prior to surgery. Men may shave face and neck. Do not bring valuables to the hospital.    [facility] is not responsible for any belongings or valuables.  Contacts, dentures or bridgework may not be worn into  surgery. Leave your suitcase in the car. After surgery it may be brought to your room. For patients admitted to the hospital, discharge time is determined by your treatment team.   Patients discharged the day of surgery will not be allowed to drive home.   Make arrangements for someone to be with you for the first 24 hours of your Same Day Discharge.   __X__ Take these medicines the morning of surgery with A SIP OF WATER:    1. None   2.   3.   4.  5.  6.  ____ Fleet Enema (as directed)   __X__ Use CHG Soap (or wipes) as directed  ____ Use Benzoyl Peroxide Gel as instructed  ____ Use inhalers on the day of surgery  __X__ Stop dapagliflozin propanediol (FARXIGA) 10 MG 3 days prior to surgery take last dose 08/16/22)    __X__ Stop Semaglutide, 2 MG/DOSE, (OZEMPIC, 2 MG/DOSE,) 8 MG/3ML  1 week before  surgery. (Do not take it the Saturday before your procedure)  ____ Call your PCP, cardiologist, or Pulmonologist if taking Coumadin/Plavix/aspirin and ask when to stop before your surgery.   __X__ One Week prior to surgery- Stop Anti-inflammatories such as Ibuprofen, Aleve, Advil, Motrin, meloxicam (MOBIC), diclofenac, etodolac, ketorolac, Toradol, Daypro, piroxicam, Goody's or BC powders. OK TO USE TYLENOL IF NEEDED   __X__ Stop supplements until after surgery.    ____ Bring C-Pap to the hospital.    If you have any questions regarding your pre-procedure instructions,  Please call Pre-admit Testing at [phone]

## 2022-08-13 NOTE — Progress Notes (Signed)
  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 Herbert Pun, MD. In review of her medication reconciliation it was noted that patient is on a prescribed GLP-1 medication. Per guidelines issued by the American Society of Anesthesiologists (ASA), it is recommended that these medications be held for 7 days prior to the patient undergoing any type of elective surgical procedure. The patient is taking the following GLP-1 medication:  '[x]'$  SEMAGLUTIDE   '[]'$  EXENATIDE  '[]'$  LIRAGLUTIDE   '[]'$  LIXISENATIDE  '[]'$  DULAGLUTIDE     '[]'$  OTHER GLP-1 medication: _______________  Reached out to prescribing provider (Mecum, PA-C) to make them aware of the guidelines from anesthesia. Given that this patient takes the prescribed GLP-1 medication for her  diabetes diagnosis, rather than for weight loss, recommendations from the prescribing provider were solicited. Prescribing provider made aware of the following so that informed decision/POC can be developed for this patient that may be taking medications belonging to these drug classes:  Oral GLP-1 medications will be held 1 day prior to surgery.  Injectable GLP-1 medications will be held 7 days prior to surgery.  Metformin is routinely held 48 hours prior to surgery due to renal concerns, potential need for contrasted imaging perioperatively, and the potential for tissue hypoxia leading to drug induced lactic acidosis.  All SGLT2i  medications are held 72 hours prior to surgery as they can be associated with the increased potential for developing euglycemic diabetic ketoacidosis (EDKA).   Impression and Plan:  SYMANTHA Munoz is on a prescribed GLP-1 medication, which induces the known side effect of decreased gastric emptying. Efforts are bring made to mitigate the risk of perioperative hyperglycemic events, as elevated blood glucose levels have been found to contribute to intra/postoperative complications. Additionally, hyperglycemic extremes can potentially necessitate the postponing of a patient's elective case in order to better optimize perioperative glycemic control, again with the aforementioned guidelines in place. With this in mind, recommendations have been sought from the prescribing provider, who has cleared patient to proceed with holding the prescribed GLP-1 as per the guidelines from the ASA.   Provider recommending: no further recommendations received from the prescribing provider.  Copy of signed clearance and recommendations placed on patient's chart for inclusion in their medical record and for review by the surgical/anesthetic team on the day of her procedure.   Honor Loh, MSN, APRN, FNP-C, CEN [facility]  Peri-operative Services Nurse Practitioner Phone: [phone] 08/13/22 2:20 PM  NOTE: This note has been prepared using Dragon dictation software. Despite my best ability to proofread, there is always the potential that unintentional transcriptional errors may still occur from this process.

## 2022-08-19 ENCOUNTER — Ambulatory Visit
Admission: RE | Admit: 2022-08-19 | Discharge: 2022-08-19 | Disposition: A | Discharge status: Home / Self Care | Payer: 59 | Source: Ambulatory Visit | Attending: General Surgery | Admitting: General Surgery

## 2022-08-19 DIAGNOSIS — D241 Benign neoplasm of right breast: Secondary | ICD-10-CM | POA: Insufficient documentation

## 2022-08-19 DIAGNOSIS — R928 Other abnormal and inconclusive findings on diagnostic imaging of breast: Secondary | ICD-10-CM | POA: Diagnosis not present

## 2022-08-20 ENCOUNTER — Ambulatory Visit
Admission: RE | Admit: 2022-08-20 | Discharge: 2022-08-20 | Disposition: A | Discharge status: Home / Self Care | Payer: 59 | Source: Ambulatory Visit | Attending: General Surgery | Admitting: General Surgery

## 2022-08-20 ENCOUNTER — Other Ambulatory Visit: Payer: Self-pay

## 2022-08-20 ENCOUNTER — Ambulatory Visit: Payer: 59 | Admitting: Urgent Care

## 2022-08-20 ENCOUNTER — Encounter
Admission: RE | Disposition: A | Discharge status: Home / Self Care | Payer: Self-pay | Source: Home / Self Care | Attending: General Surgery

## 2022-08-20 ENCOUNTER — Ambulatory Visit
Admission: RE | Admit: 2022-08-20 | Discharge: 2022-08-20 | Disposition: A | Discharge status: Home / Self Care | Payer: 59 | Attending: General Surgery | Admitting: General Surgery

## 2022-08-20 ENCOUNTER — Encounter: Payer: Self-pay | Admitting: General Surgery

## 2022-08-20 DIAGNOSIS — E119 Type 2 diabetes mellitus without complications: Secondary | ICD-10-CM

## 2022-08-20 DIAGNOSIS — Z800 Family history of malignant neoplasm of digestive organs: Secondary | ICD-10-CM | POA: Insufficient documentation

## 2022-08-20 DIAGNOSIS — D241 Benign neoplasm of right breast: Secondary | ICD-10-CM | POA: Insufficient documentation

## 2022-08-20 DIAGNOSIS — Z803 Family history of malignant neoplasm of breast: Secondary | ICD-10-CM | POA: Insufficient documentation

## 2022-08-20 DIAGNOSIS — N6091 Unspecified benign mammary dysplasia of right breast: Secondary | ICD-10-CM | POA: Diagnosis not present

## 2022-08-20 DIAGNOSIS — Z794 Long term (current) use of insulin: Secondary | ICD-10-CM | POA: Diagnosis not present

## 2022-08-20 DIAGNOSIS — Z8052 Family history of malignant neoplasm of bladder: Secondary | ICD-10-CM | POA: Insufficient documentation

## 2022-08-20 DIAGNOSIS — E109 Type 1 diabetes mellitus without complications: Secondary | ICD-10-CM | POA: Diagnosis not present

## 2022-08-20 DIAGNOSIS — Z6841 Body Mass Index (BMI) 40.0 and over, adult: Secondary | ICD-10-CM | POA: Diagnosis not present

## 2022-08-20 DIAGNOSIS — Z8042 Family history of malignant neoplasm of prostate: Secondary | ICD-10-CM | POA: Insufficient documentation

## 2022-08-20 DIAGNOSIS — E7800 Pure hypercholesterolemia, unspecified: Secondary | ICD-10-CM | POA: Diagnosis not present

## 2022-08-20 DIAGNOSIS — R928 Other abnormal and inconclusive findings on diagnostic imaging of breast: Secondary | ICD-10-CM | POA: Diagnosis not present

## 2022-08-20 HISTORY — PX: BREAST BIOPSY WITH RADIO FREQUENCY LOCALIZER: SHX6895

## 2022-08-20 LAB — GLUCOSE, CAPILLARY
Glucose-Capillary: 147 mg/dL — ABNORMAL HIGH (ref 70–99)
Glucose-Capillary: 158 mg/dL — ABNORMAL HIGH (ref 70–99)

## 2022-08-20 SURGERY — BREAST BIOPSY WITH RADIO FREQUENCY LOCALIZER
Anesthesia: General | Site: Breast | Laterality: Right

## 2022-08-20 MED ORDER — FENTANYL CITRATE (PF) 100 MCG/2ML IJ SOLN
INTRAMUSCULAR | Status: AC
  Filled 2022-08-20: qty 2

## 2022-08-20 MED ORDER — BUPIVACAINE-EPINEPHRINE (PF) 0.5% -1:200000 IJ SOLN
INTRAMUSCULAR | Status: AC
  Filled 2022-08-20: qty 30

## 2022-08-20 MED ORDER — PHENYLEPHRINE HCL (PRESSORS) 10 MG/ML IV SOLN
INTRAVENOUS | Status: DC | PRN
  Administered 2022-08-20: 160 ug via INTRAVENOUS
  Administered 2022-08-20: 240 ug via INTRAVENOUS
  Administered 2022-08-20 (×2): 80 ug via INTRAVENOUS

## 2022-08-20 MED ORDER — CHLORHEXIDINE GLUCONATE 0.12 % MT SOLN: 15.0000 mL | Freq: Once | OROMUCOSAL | Status: AC

## 2022-08-20 MED ORDER — LIDOCAINE HCL (CARDIAC) PF 100 MG/5ML IV SOSY
PREFILLED_SYRINGE | INTRAVENOUS | Status: DC | PRN
  Administered 2022-08-20: 80 mg via INTRAVENOUS

## 2022-08-20 MED ORDER — LACTATED RINGERS IV SOLN: INTRAVENOUS | Status: DC | PRN

## 2022-08-20 MED ORDER — LIDOCAINE HCL (PF) 2 % IJ SOLN
INTRAMUSCULAR | Status: AC
  Filled 2022-08-20: qty 5

## 2022-08-20 MED ORDER — PROPOFOL 1000 MG/100ML IV EMUL
INTRAVENOUS | Status: AC
  Filled 2022-08-20: qty 100

## 2022-08-20 MED ORDER — ONDANSETRON HCL 4 MG/2ML IJ SOLN: 4.0000 mg | Freq: Once | INTRAMUSCULAR | Status: DC | PRN

## 2022-08-20 MED ORDER — FENTANYL CITRATE (PF) 100 MCG/2ML IJ SOLN
INTRAMUSCULAR | Status: DC | PRN
  Administered 2022-08-20 (×2): 50 ug via INTRAVENOUS

## 2022-08-20 MED ORDER — SODIUM CHLORIDE 0.9 % IV SOLN: INTRAVENOUS | Status: DC

## 2022-08-20 MED ORDER — PHENYLEPHRINE HCL-NACL 20-0.9 MG/250ML-% IV SOLN
INTRAVENOUS | Status: AC
  Filled 2022-08-20: qty 250

## 2022-08-20 MED ORDER — FENTANYL CITRATE (PF) 100 MCG/2ML IJ SOLN: 25.0000 ug | INTRAMUSCULAR | Status: DC | PRN

## 2022-08-20 MED ORDER — PROPOFOL 10 MG/ML IV BOLUS
INTRAVENOUS | Status: DC | PRN
  Administered 2022-08-20: 200 mg via INTRAVENOUS

## 2022-08-20 MED ORDER — CHLORHEXIDINE GLUCONATE 0.12 % MT SOLN
OROMUCOSAL | Status: AC
  Administered 2022-08-20: 15 mL via OROMUCOSAL
  Filled 2022-08-20: qty 15

## 2022-08-20 MED ORDER — CEFAZOLIN SODIUM-DEXTROSE 2-4 GM/100ML-% IV SOLN
2.0000 g | INTRAVENOUS | Status: AC
  Administered 2022-08-20: 2 g via INTRAVENOUS

## 2022-08-20 MED ORDER — SUGAMMADEX SODIUM 500 MG/5ML IV SOLN
INTRAVENOUS | Status: DC | PRN
  Administered 2022-08-20: 250 mg via INTRAVENOUS

## 2022-08-20 MED ORDER — STERILE WATER FOR IRRIGATION IR SOLN
Status: DC | PRN
  Administered 2022-08-20: 500 mL

## 2022-08-20 MED ORDER — SEVOFLURANE IN SOLN
RESPIRATORY_TRACT | Status: AC
  Filled 2022-08-20: qty 250

## 2022-08-20 MED ORDER — ACETAMINOPHEN 500 MG PO TABS
ORAL_TABLET | ORAL | Status: AC
  Administered 2022-08-20: 1000 mg via ORAL
  Filled 2022-08-20: qty 2

## 2022-08-20 MED ORDER — DEXAMETHASONE SODIUM PHOSPHATE 10 MG/ML IJ SOLN
INTRAMUSCULAR | Status: AC
  Filled 2022-08-20: qty 1

## 2022-08-20 MED ORDER — PHENYLEPHRINE HCL-NACL 20-0.9 MG/250ML-% IV SOLN
INTRAVENOUS | Status: DC | PRN
  Administered 2022-08-20: 50 ug/min via INTRAVENOUS

## 2022-08-20 MED ORDER — FAMOTIDINE 20 MG PO TABS
ORAL_TABLET | ORAL | Status: AC
  Filled 2022-08-20: qty 1

## 2022-08-20 MED ORDER — EPHEDRINE SULFATE (PRESSORS) 50 MG/ML IJ SOLN
INTRAMUSCULAR | Status: DC | PRN
  Administered 2022-08-20: 5 mg via INTRAVENOUS

## 2022-08-20 MED ORDER — PROPOFOL 10 MG/ML IV BOLUS
INTRAVENOUS | Status: AC
  Filled 2022-08-20: qty 20

## 2022-08-20 MED ORDER — SUCCINYLCHOLINE CHLORIDE 200 MG/10ML IV SOSY
PREFILLED_SYRINGE | INTRAVENOUS | Status: DC | PRN
  Administered 2022-08-20: 100 mg via INTRAVENOUS

## 2022-08-20 MED ORDER — FAMOTIDINE 20 MG PO TABS: 20.0000 mg | ORAL_TABLET | Freq: Once | ORAL | Status: AC

## 2022-08-20 MED ORDER — ONDANSETRON HCL 4 MG/2ML IJ SOLN
INTRAMUSCULAR | Status: AC
  Filled 2022-08-20: qty 2

## 2022-08-20 MED ORDER — HYDROCODONE-ACETAMINOPHEN 5-325 MG PO TABS
1.0000 | ORAL_TABLET | ORAL | 0 refills | Status: AC | PRN
  Filled 2022-08-20: qty 16, 3d supply, fill #0

## 2022-08-20 MED ORDER — FAMOTIDINE 20 MG PO TABS
ORAL_TABLET | ORAL | Status: AC
  Administered 2022-08-20: 20 mg via ORAL
  Filled 2022-08-20: qty 1

## 2022-08-20 MED ORDER — CEFAZOLIN SODIUM-DEXTROSE 2-4 GM/100ML-% IV SOLN
INTRAVENOUS | Status: AC
  Filled 2022-08-20: qty 100

## 2022-08-20 MED ORDER — MIDAZOLAM HCL 2 MG/2ML IJ SOLN
INTRAMUSCULAR | Status: AC
  Filled 2022-08-20: qty 2

## 2022-08-20 MED ORDER — SODIUM CHLORIDE 0.9 % IV SOLN: INTRAVENOUS | Status: DC | PRN

## 2022-08-20 MED ORDER — MIDAZOLAM HCL 2 MG/2ML IJ SOLN
INTRAMUSCULAR | Status: DC | PRN
  Administered 2022-08-20: 2 mg via INTRAVENOUS

## 2022-08-20 MED ORDER — ONDANSETRON HCL 4 MG/2ML IJ SOLN
INTRAMUSCULAR | Status: DC | PRN
  Administered 2022-08-20: 4 mg via INTRAVENOUS

## 2022-08-20 MED ORDER — ACETAMINOPHEN 500 MG PO TABS: 1000.0000 mg | ORAL_TABLET | Freq: Once | ORAL | Status: AC

## 2022-08-20 MED ORDER — ACETAMINOPHEN 10 MG/ML IV SOLN
INTRAVENOUS | Status: AC
  Filled 2022-08-20: qty 100

## 2022-08-20 MED ORDER — ORAL CARE MOUTH RINSE: 15.0000 mL | Freq: Once | OROMUCOSAL | Status: AC

## 2022-08-20 MED ORDER — DEXAMETHASONE SODIUM PHOSPHATE 10 MG/ML IJ SOLN
INTRAMUSCULAR | Status: DC | PRN
  Administered 2022-08-20: 5 mg via INTRAVENOUS

## 2022-08-20 MED ORDER — KETAMINE HCL 50 MG/5ML IJ SOSY
PREFILLED_SYRINGE | INTRAMUSCULAR | Status: AC
  Filled 2022-08-20: qty 5

## 2022-08-20 MED ORDER — BUPIVACAINE-EPINEPHRINE (PF) 0.5% -1:200000 IJ SOLN
INTRAMUSCULAR | Status: DC | PRN
  Administered 2022-08-20: 20 mL via PERINEURAL

## 2022-08-20 MED ORDER — ROCURONIUM BROMIDE 100 MG/10ML IV SOLN
INTRAVENOUS | Status: DC | PRN
  Administered 2022-08-20: 40 mg via INTRAVENOUS

## 2022-08-20 SURGICAL SUPPLY — 41 items
BLADE SURG 15 STRL LF DISP TIS (BLADE) ×1 IMPLANT
BLADE SURG 15 STRL SS (BLADE) ×1
CHLORAPREP W/TINT 26 (MISCELLANEOUS) ×1 IMPLANT
CNTNR SPEC 2.5X3XGRAD LEK (MISCELLANEOUS) ×1
CONT SPEC 4OZ STER OR WHT (MISCELLANEOUS) ×1
CONTAINER SPEC 2.5X3XGRAD LEK (MISCELLANEOUS) ×1 IMPLANT
DERMABOND ADVANCED .7 DNX12 (GAUZE/BANDAGES/DRESSINGS) ×1 IMPLANT
DEVICE DUBIN SPECIMEN MAMMOGRA (MISCELLANEOUS) ×1 IMPLANT
DRAPE LAPAROTOMY TRNSV 106X77 (MISCELLANEOUS) ×1 IMPLANT
ELECT CAUTERY BLADE TIP 2.5 (TIP) ×1
ELECT REM PT RETURN 9FT ADLT (ELECTROSURGICAL) ×1
ELECTRODE CAUTERY BLDE TIP 2.5 (TIP) ×1 IMPLANT
ELECTRODE REM PT RTRN 9FT ADLT (ELECTROSURGICAL) ×1 IMPLANT
GAUZE 4X4 16PLY ~~LOC~~+RFID DBL (SPONGE) ×1 IMPLANT
GLOVE BIO SURGEON STRL SZ 6.5 (GLOVE) ×1 IMPLANT
GLOVE BIOGEL PI IND STRL 6.5 (GLOVE) ×1 IMPLANT
GOWN STRL REUS W/ TWL LRG LVL3 (GOWN DISPOSABLE) ×3 IMPLANT
GOWN STRL REUS W/TWL LRG LVL3 (GOWN DISPOSABLE) ×3
KIT MARKER MARGIN INK (KITS) IMPLANT
KIT TURNOVER KIT A (KITS) ×1 IMPLANT
LABEL OR SOLS (LABEL) ×1 IMPLANT
MANIFOLD NEPTUNE II (INSTRUMENTS) ×1 IMPLANT
MARKER MARGIN CORRECT CLIP (MARKER) IMPLANT
NDL HYPO 25X1 1.5 SAFETY (NEEDLE) ×1 IMPLANT
NEEDLE HYPO 25X1 1.5 SAFETY (NEEDLE) ×1 IMPLANT
PACK BASIN MINOR ARMC (MISCELLANEOUS) ×1 IMPLANT
RETRACTOR RING XSMALL (MISCELLANEOUS) IMPLANT
RTRCTR WOUND ALEXIS 13CM XS SH (MISCELLANEOUS) ×1
SET LOCALIZER 20 PROBE US (MISCELLANEOUS) ×1 IMPLANT
SUT MNCRL 4-0 (SUTURE) ×1
SUT MNCRL 4-0 27XMFL (SUTURE) ×1
SUT SILK 2 0 SH (SUTURE) ×1 IMPLANT
SUT VIC AB 3-0 SH 27 (SUTURE) ×1
SUT VIC AB 3-0 SH 27X BRD (SUTURE) ×1 IMPLANT
SUTURE MNCRL 4-0 27XMF (SUTURE) ×1 IMPLANT
SYR 10ML LL (SYRINGE) ×1 IMPLANT
SYR BULB IRRIG 60ML STRL (SYRINGE) ×1 IMPLANT
TRAP FLUID SMOKE EVACUATOR (MISCELLANEOUS) ×1 IMPLANT
TRAP NEPTUNE SPECIMEN COLLECT (MISCELLANEOUS) ×1 IMPLANT
WATER STERILE IRR 1000ML POUR (IV SOLUTION) ×1 IMPLANT
WATER STERILE IRR 500ML POUR (IV SOLUTION) ×1 IMPLANT

## 2022-08-20 NOTE — Anesthesia Procedure Notes (Signed)
Procedure Name: Intubation Date/Time: 08/20/2022 7:56 AM  Performed by: Beverely Low, CRNAPre-anesthesia Checklist: Patient identified, Patient being monitored, Timeout performed, Emergency Drugs available and Suction available Patient Re-evaluated:Patient Re-evaluated prior to induction Oxygen Delivery Method: Circle system utilized Preoxygenation: Pre-oxygenation with 100% oxygen Induction Type: IV induction Ventilation: Mask ventilation without difficulty Laryngoscope Size: 3 and McGraph Grade View: Grade IV Tube type: Oral Tube size: 7.0 mm Number of attempts: 1 Airway Equipment and Method: Stylet Placement Confirmation: ETT inserted through vocal cords under direct vision, positive ETCO2 and breath sounds checked- equal and bilateral Secured at: 21 cm Tube secured with: Tape Dental Injury: Teeth and Oropharynx as per pre-operative assessment

## 2022-08-20 NOTE — Anesthesia Postprocedure Evaluation (Signed)
Anesthesia Post Note  Patient: Cassandra Munoz  Procedure(s) Performed: BREAST BIOPSY WITH RADIO FREQUENCY LOCALIZER (Right: Breast)  Patient location during evaluation: PACU Anesthesia Type: General Level of consciousness: awake and alert Pain management: pain level controlled Vital Signs Assessment: post-procedure vital signs reviewed and stable Respiratory status: spontaneous breathing, nonlabored ventilation, respiratory function stable and patient connected to nasal cannula oxygen Cardiovascular status: blood pressure returned to baseline and stable Postop Assessment: no apparent nausea or vomiting Anesthetic complications: no   No notable events documented.   Last Vitals:  Vitals:   08/20/22 0857 08/20/22 0900  BP: (!) 151/81 137/71  Pulse: (!) 102 (!) 103  Resp: 19 (!) 27  Temp: (!) 36.1 C   SpO2: 97% 97%    Last Pain:  Vitals:   08/20/22 0857  TempSrc:   PainSc: [place] Effie Janoski

## 2022-08-20 NOTE — Interval H&P Note (Signed)
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  The various methods of treatment have been discussed with the patient and family. After consideration of risks, benefits and other options for treatment, the patient has consented to  Procedure(s): BREAST BIOPSY WITH RADIO FREQUENCY LOCALIZER (Right) as a surgical intervention.  The patient's history has been reviewed, patient examined, no change in status, stable for surgery.  I have reviewed the patient's chart and labs.  Questions were answered to the patient's satisfaction.     Herbert Pun

## 2022-08-20 NOTE — Op Note (Signed)
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   Procedure: Right radiofrequency tag-localized excisional biopsy                     Anesthesia: GETA  Surgeon: Dr. Windell Moment  Wound Classification: Clean  Indications: Patient is a 61 y.o. female with a nonpalpable right breast mass noted on mammography with core biopsy demonstrating atypical ductal hyperplasia requires radiofrequency tag-localized excisional biopsy to rule out malignancy  Findings: 1. Specimen mammography shows marker and tag on specimen 2. No other palpable mass or lymph node identified.   Description of procedure: Preoperative radiofrequency tag localization was performed by radiology. Localization studies were reviewed. The patient was taken to the operating room and placed supine on the operating table, and after general anesthesia the right chest and axilla were prepped and draped in the usual sterile fashion. A time-out was completed verifying correct patient, procedure, site, positioning, and implant(s) and/or special equipment prior to beginning this procedure.  By comparing the localization studies and interrogation with [facility] device, the probable trajectory and location of the mass was visualized. A circumareolar skin incision was planned in such a way as to minimize the amount of dissection to reach the mass.  The skin incision was made. Flaps were raised and the location of the tag was confirmed with [facility] device confirmed. Dissection was then taken down circumferentially, taking care to include the entire localizing tag and a wide margin of grossly normal tissue. The specimen and entire localizing tag were removed. The specimen was oriented and sent to radiology with the localization studies. Confirmation was received that the entire target lesion had been resected. The wound was irrigated. Hemostasis was checked. The wound was closed with interrupted  sutures of 3-0 Vicryl and a subcuticular suture of Monocryl 3-0. No attempt was made to close the dead space.   The patient tolerated the procedure well and was taken to the postanesthesia care unit in stable condition.    Specimen: Right Breast excisional biopsy                      Complications: None  Estimated Blood Loss: 3 mL

## 2022-08-20 NOTE — Anesthesia Preprocedure Evaluation (Signed)
Anesthesia Evaluation  Patient identified by MRN, date of birth, ID band Patient awake    Reviewed: Allergy & Precautions, H&P , NPO status , Patient's Chart, lab work & pertinent test results, reviewed documented beta blocker date and time   Airway Mallampati: II  TM Distance: >3 FB Neck ROM: full    Dental  (+) Teeth Intact   Pulmonary neg pulmonary ROS,    Pulmonary exam normal        Cardiovascular Exercise Tolerance: Good negative cardio ROS Normal cardiovascular exam Rate:Normal     Neuro/Psych  Neuromuscular disease negative psych ROS   GI/Hepatic negative GI ROS, Neg liver ROS,   Endo/Other  diabetes, Well Controlled, Type 1Morbid obesity  Renal/GU negative Renal ROS  negative genitourinary   Musculoskeletal   Abdominal   Peds  Hematology negative hematology ROS (+)   Anesthesia Other Findings   Reproductive/Obstetrics negative OB ROS                             Anesthesia Physical Anesthesia Plan  ASA: 3  Anesthesia Plan: General LMA   Post-op Pain Management:    Induction:   PONV Risk Score and Plan: 4 or greater  Airway Management Planned:   Additional Equipment:   Intra-op Plan:   Post-operative Plan:   Informed Consent: I have reviewed the patients History and Physical, chart, labs and discussed the procedure including the risks, benefits and alternatives for the proposed anesthesia with the patient or authorized representative who has indicated his/her understanding and acceptance.       Plan Discussed with: CRNA  Anesthesia Plan Comments:         Anesthesia Quick Evaluation

## 2022-08-20 NOTE — Discharge Instructions (Addendum)
  Diet: Resume home heart healthy regular diet.   Activity: Increase activity as tolerated. Light activity and walking are encouraged. Do not drive or drink alcohol if taking narcotic pain medications.  Wound care: May shower with soapy water and pat dry (do not rub incisions), but no baths or submerging incision underwater until follow-up. (no swimming)   Medications: Resume all home medications. For mild to moderate pain: acetaminophen (Tylenol) or ibuprofen (if no kidney disease). Combining Tylenol with alcohol can substantially increase your risk of causing liver disease. Narcotic pain medications, if prescribed, can be used for severe pain, though may cause nausea, constipation, and drowsiness. Do not combine Tylenol and Norco within a 6 hour period as Norco contains Tylenol. If you do not need the narcotic pain medication, you do not need to fill the prescription.  Call office ([phone]) at any time if any questions, worsening pain, fevers/chills, bleeding, drainage from incision site, or other concerns.   AMBULATORY SURGERY  DISCHARGE INSTRUCTIONS   The drugs that you were given will stay in your system until tomorrow so for the next 24 hours you should not:  Drive an automobile Make any legal decisions Drink any alcoholic beverage   You may resume regular meals tomorrow.  Today it is better to start with liquids and gradually work up to solid foods.  You may eat anything you prefer, but it is better to start with liquids, then soup and crackers, and gradually work up to solid foods.   Please notify your doctor immediately if you have any unusual bleeding, trouble breathing, redness and pain at the surgery site, drainage, fever, or pain not relieved by medication.    Additional Instructions:        Please contact your physician with any problems or Same Day Surgery at [phone], Monday through Friday 6 am to 4 pm, or [place] [facility] [place] Main number at  [phone]. 

## 2022-08-20 NOTE — Transfer of Care (Signed)
Immediate Anesthesia Transfer of Care Note  Patient: Cassandra Munoz  Procedure(s) Performed: BREAST BIOPSY WITH RADIO FREQUENCY LOCALIZER (Right: Breast)  Patient Location: PACU  Anesthesia Type:General  Level of Consciousness: drowsy  Airway & Oxygen Therapy: Patient Spontanous Breathing and Patient connected to face mask oxygen  Post-op Assessment: Report given to RN and Post -op Vital signs reviewed and stable  Post vital signs: Reviewed and stable  Last Vitals:  Vitals Value Taken Time  BP 151/81 08/20/22 0857  Temp 36.1 C 08/20/22 0857  Pulse 99 08/20/22 0858  Resp 25 08/20/22 0858  SpO2 99 % 08/20/22 0858  Vitals shown include unvalidated device data.  Last Pain:  Vitals:   08/20/22 0633  TempSrc: Temporal  PainSc: 0-No pain         Complications: No notable events documented.

## 2022-08-21 ENCOUNTER — Encounter: Payer: Self-pay | Admitting: General Surgery

## 2022-08-21 LAB — SURGICAL PATHOLOGY

## 2022-08-25 ENCOUNTER — Other Ambulatory Visit (HOSPITAL_COMMUNITY): Payer: Self-pay

## 2022-08-28 ENCOUNTER — Other Ambulatory Visit: Payer: Self-pay

## 2022-08-28 ENCOUNTER — Inpatient Hospital Stay: Payer: 59 | Attending: Oncology | Admitting: Oncology

## 2022-08-28 ENCOUNTER — Other Ambulatory Visit: Payer: Self-pay | Admitting: Internal Medicine

## 2022-08-28 ENCOUNTER — Encounter: Payer: Self-pay | Admitting: Oncology

## 2022-08-28 VITALS — BP 136/74 | HR 90 | Temp 99.0°F | Resp 18 | Wt 255.6 lb

## 2022-08-28 DIAGNOSIS — E559 Vitamin D deficiency, unspecified: Secondary | ICD-10-CM | POA: Diagnosis not present

## 2022-08-28 DIAGNOSIS — N6099 Unspecified benign mammary dysplasia of unspecified breast: Secondary | ICD-10-CM | POA: Insufficient documentation

## 2022-08-28 DIAGNOSIS — D751 Secondary polycythemia: Secondary | ICD-10-CM | POA: Diagnosis not present

## 2022-08-28 DIAGNOSIS — Z7984 Long term (current) use of oral hypoglycemic drugs: Secondary | ICD-10-CM | POA: Diagnosis not present

## 2022-08-28 DIAGNOSIS — E782 Mixed hyperlipidemia: Secondary | ICD-10-CM | POA: Diagnosis not present

## 2022-08-28 DIAGNOSIS — Z800 Family history of malignant neoplasm of digestive organs: Secondary | ICD-10-CM | POA: Diagnosis not present

## 2022-08-28 DIAGNOSIS — E119 Type 2 diabetes mellitus without complications: Secondary | ICD-10-CM | POA: Diagnosis not present

## 2022-08-28 DIAGNOSIS — Z79899 Other long term (current) drug therapy: Secondary | ICD-10-CM | POA: Insufficient documentation

## 2022-08-28 DIAGNOSIS — Z794 Long term (current) use of insulin: Secondary | ICD-10-CM | POA: Insufficient documentation

## 2022-08-28 DIAGNOSIS — Z8042 Family history of malignant neoplasm of prostate: Secondary | ICD-10-CM | POA: Insufficient documentation

## 2022-08-28 DIAGNOSIS — Z803 Family history of malignant neoplasm of breast: Secondary | ICD-10-CM | POA: Diagnosis not present

## 2022-08-28 DIAGNOSIS — N6091 Unspecified benign mammary dysplasia of right breast: Secondary | ICD-10-CM | POA: Insufficient documentation

## 2022-08-28 DIAGNOSIS — Z9189 Other specified personal risk factors, not elsewhere classified: Secondary | ICD-10-CM | POA: Diagnosis not present

## 2022-08-28 MED FILL — Semaglutide Soln Pen-inj 2 MG/DOSE (8 MG/3ML): SUBCUTANEOUS | 28 days supply | Qty: 3 | Fill #1 | Status: AC

## 2022-08-28 NOTE — Assessment & Plan Note (Signed)
Refer patient to establish care with genetic counselor.

## 2022-08-28 NOTE — Progress Notes (Addendum)
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 There is an indeterminate 7 mm mass in the right breast at 9 o'clock. 2.  No evidence of right axillary lymphadenopathy. 3.  No evidence of left breast malignancy.  07/14/22 patient underwent right breast mass biopsy  Pathology showed fibroepithelial proliferation with sclerosis. Negative for ductal carcinoma in situ and malignancy.  The histologic features favor an intraductal papilloma with sclerosis, apocrine metaplasia, and focal atypical ductal hyperplasia, but the differential diagnosis may also include a complex fibroadenoma or complex sclerosing lesion   Patient established care with Dr.Cintron and underwent excision.    08/20/22  Right breast excision pathology showed benign mammary parenchyma displaying residual dense sclerosis with associated usual ductal hyperplasia, likely representing residual sclerosed intraductal papilloma with post biopsy changes.  Background benign mammary parenchyma with mild stromal fibrosis, fibrocystic and apocrine changes and focal usual ductal hyperplasia. Negative for residual atypical proliferative breast disease.   Patient was referred to oncology for further evaluation.   + Family history of breast cancer and colin cancer,  + history of TAH +BSO in  2016.   Menarche 11-12 yo History of OCP use x 5 years,  Denies HRT Denies any other breast biopsy.      MEDICAL HISTORY:  Past Medical History:  Diagnosis Date   Atypical mole 02/08/2020   Left forearm. Melanocytic proliferation of uncertain malignant potential, base involved.   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   Psoriasis    Sciatic pain 2015   Vitamin D deficiency     SURGICAL HISTORY: Past Surgical History:  Procedure Laterality Date   ABDOMINAL HYSTERECTOMY N/A 05/07/2015   Procedure: HYSTERECTOMY ABDOMINAL;  Surgeon: Brayton Mars, MD;  Location: [facility] ORS;  Service: Gynecology;  Laterality: N/A;   ANKLE ARTHROSCOPY Right    BREAST BIOPSY Right 07/14/2022   US biopsy/ ribbon clip/ path pending   BREAST BIOPSY WITH RADIO FREQUENCY LOCALIZER Right 08/20/2022   Procedure: BREAST BIOPSY WITH RADIO FREQUENCY LOCALIZER;  Surgeon: Herbert Pun, MD;  Location: [facility] ORS;  Service: General;  Laterality: Right;   COLONOSCOPY WITH PROPOFOL N/A 04/03/2016   Procedure: COLONOSCOPY WITH PROPOFOL;  Surgeon: Manya Silvas, MD;  Location: [facility] ENDOSCOPY;  Service: Endoscopy;  Laterality: N/A;   ROBOTIC ASSISTED LAPAROSCOPIC VAGINAL HYSTERECTOMY WITH FIBROID REMOVAL     Fibroid removal   TONSILLECTOMY AND ADENOIDECTOMY     as a chaild    SOCIAL HISTORY: Social History   Socioeconomic History   Marital status: Married    Spouse name: Not on file   Number of children: Not on file   Years of education: Not on file   Highest education level: Not on file  Occupational History   Not on file  Tobacco Use   Smoking status: Never  Smokeless tobacco: Never  Vaping Use   Vaping Use: Never used  Substance and Sexual Activity   Alcohol use: No    Alcohol/week: 0.0 standard drinks of alcohol    Comment: maybe 1 drink/month - socially    Drug use: No   Sexual activity: Yes    Birth control/protection: None  Other Topics Concern   Not on file  Social History Narrative   [facility]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   Occasional alcohol use   1 miscarriage   Last PAP: 2018 (Normal [place])   Mammogram screening: 2013 (Normal/[place])      Social Determinants of Health   Financial Resource Strain: Not on file  Food Insecurity: Not on file  Transportation Needs: Not on file  Physical Activity: Not on file  Stress: Not on file  Social Connections: Not on file  Intimate Partner Violence: Not on file    FAMILY HISTORY: Family History  Problem Relation Age of Onset   Diabetes Mother    Hypertension Mother    Colon cancer Father    Heart disease Father    Hypertension Father    Cancer Father    Prostate cancer Father    Colon cancer Sister    Cancer Sister    Hypertension Brother    Hyperlipidemia Brother    Diabetes Brother    Cancer Brother    Bladder Cancer Brother    Heart failure Brother        had pace maker put in   Breast cancer Paternal Aunt        times 3   Breast cancer Paternal Aunt    Breast cancer Paternal Aunt     ALLERGIES:  is allergic to clobetasol.  MEDICATIONS:  Current Outpatient Medications  Medication Sig Dispense Refill   azelastine (ASTELIN) 0.1 % nasal spray One to two spray each nostril  q 12 prn  drainage 30 mL 1   dapagliflozin propanediol (FARXIGA) 10 MG TABS tablet Take 1 tablet (10 mg total) by mouth daily every morning 90 tablet 3   ezetimibe (ZETIA) 10 MG tablet Take 1 tablet (10 mg total) by mouth daily. 90 tablet 3   glucose blood test strip USE TO CHECK BLOOD SUGAR 2 TIMES A DAY. Freestyle lite 200 strip 12   Insulin Pen Needle (UNIFINE PENTIPS) 32G X 4 MM MISC USE DAILY AS INSTRUCTED 100 each 1   Lancets (FREESTYLE) lancets Use to check blood sugar 2 times a day. 200 each 12   Multiple Vitamin (MULTI-VITAMINS) TABS Take by mouth.     ondansetron (ZOFRAN) 8 MG tablet Take 1 tablet (8 mg  total) by mouth every 8 (eight) hours as needed for nausea or vomiting. 20 tablet 1   pramoxine (SARNA SENSITIVE) 1 % LOTN Apply to affected areas once or twice daily as needed 237 mL 0   Risankizumab-rzaa (SKYRIZI) 150 MG/ML SOSY Inject 150 mg SQ every 12 weeks 1 mL 4   Semaglutide, 2 MG/DOSE, (OZEMPIC, 2 MG/DOSE,) 8 MG/3ML SOPN Inject 2 mg into the skin once a week. 3 mL 3   triamcinolone (KENALOG) 0.147 MG/GM topical spray Apply topically once or twice daily until clear then as needed 100 g 3   vitamin B-12 (CYANOCOBALAMIN) 100 MCG tablet Take 100 mcg by mouth daily.     nystatin-triamcinolone (MYCOLOG II) cream Apply 1 application topically 2 (two) times daily. Use for 10-14 days (Patient not taking: Reported on 08/11/2022) 30 g 2   No current facility-administered medications for  this visit.    Review of Systems  Constitutional:  Negative for appetite change, chills, fatigue and fever.  HENT:   Negative for hearing loss and voice change.   Eyes:  Negative for eye problems.  Respiratory:  Negative for chest tightness and cough.   Cardiovascular:  Negative for chest pain.  Gastrointestinal:  Negative for abdominal distention, abdominal pain and blood in stool.  Endocrine: Negative for hot flashes.  Genitourinary:  Negative for difficulty urinating and frequency.   Musculoskeletal:  Negative for arthralgias.  Skin:  Negative for itching and rash.  Neurological:  Negative for extremity weakness.  Hematological:  Negative for adenopathy.  Psychiatric/Behavioral:  Negative for confusion.    PHYSICAL EXAMINATION: ECOG PERFORMANCE STATUS: 0 - Asymptomatic Vitals:   08/28/22 1500  BP: 136/74  Pulse: 90  Resp: 18  Temp: 99 F (37.2 C)   Filed Weights   08/28/22 1500  Weight: 255 lb 9.6 oz (115.9 kg)    Physical Exam Constitutional:      General: She is not in acute distress. HENT:     Head: Normocephalic and atraumatic.  Eyes:     General: No scleral  icterus. Cardiovascular:     Rate and Rhythm: Normal rate and regular rhythm.     Heart sounds: Normal heart sounds.  Pulmonary:     Effort: Pulmonary effort is normal. No respiratory distress.     Breath sounds: No wheezing.  Abdominal:     General: Bowel sounds are normal. There is no distension.     Palpations: Abdomen is soft.  Musculoskeletal:        General: No deformity. Normal range of motion.     Cervical back: Normal range of motion and neck supple.  Skin:    General: Skin is warm and dry.     Findings: No erythema or rash.  Neurological:     Mental Status: She is alert and oriented to person, place, and time. Mental status is at baseline.     Cranial Nerves: No cranial nerve deficit.     Coordination: Coordination normal.  Psychiatric:        Mood and Affect: Mood normal.     LABORATORY DATA:  I have reviewed the data as listed    Latest Ref Rng & Units 08/01/2022    8:27 AM 01/28/2022    8:47 AM 11/05/2021   10:10 AM  CBC  WBC 4.0 - 10.5 K/uL 6.8  6.6  6.0   Hemoglobin 12.0 - 15.0 g/dL 15.3  16.2  16.0   Hematocrit 36.0 - 46.0 % 46.0  49.9  48.0   Platelets 150 - 400 K/uL 240  238  253       Latest Ref Rng & Units 08/01/2022    8:27 AM 04/10/2022   10:05 AM 01/28/2022    8:47 AM  CMP  Glucose 70 - 99 mg/dL 126  111  130   BUN 8 - 23 mg/dL _0 Creatinine 0.44 - 1.00 mg/dL 0.75  0.72  0.94   Sodium 135 - 145 mmol/L 136  140  140   Potassium 3.5 - 5.1 mmol/L 4.0  4.5  4.2   Chloride 98 - 111 mmol/L 107  104  104   CO2 22 - 32 mmol/L _1 Calcium 8.9 - 10.3 mg/dL 9.0  9.3  9.4   Total Protein 6.5 - 8.1 g/dL 7.7  7.0  7.5  Total Bilirubin 0.3 - 1.2 mg/dL 0.5  0.3  0.3   Alkaline Phos 38 - 126 U/L 45  58  59   AST 15 - 41 U/L 37  30  29   ALT 0 - 44 U/L 62  52  47       RADIOGRAPHIC STUDIES: I have personally reviewed the radiological images as listed and agreed with the findings in the report. MM Breast Surgical Specimen  Result Date:  08/20/2022 CLINICAL DATA:  Status post Savi Scout localized RIGHT breast lumpectomy. EXAM: SPECIMEN RADIOGRAPH OF THE RIGHT BREAST COMPARISON:  Previous exam(s). FINDINGS: Status post excision of the RIGHT breast. The [facility] reflector and RIBBON shaped clip are present within the specimen. IMPRESSION: Specimen radiograph of the RIGHT breast. Electronically Signed   By: Valentino Saxon M.D.   On: 08/20/2022 08:58  MM RT RADIO FREQUENCY TAG LOC MAMMO GUIDE  Result Date: 08/19/2022 CLINICAL DATA:  61 year old with biopsy-proven fibroepithelial proliferation with sclerosis, possibly representing an intraductal papilloma with sclerosis and focal atypical ductal hyperplasia involving the outer RIGHT breast; differential diagnosis per pathology included a complex fibroadenoma or complex sclerosing lesion. Excisional biopsy was recommended for definitive diagnosis. SAVI SCOUT radiofrequency device localization is performed in anticipation of the excisional biopsy. EXAM: MAMMOGRAPHIC GUIDED RADIOFREQUENCY DEVICE LOCALIZATION OF THE RIGHT BREAST COMPARISON:  Previous exam(s). FINDINGS: Patient presents for radiofrequency device localization prior to RIGHT breast excisional biopsy. I met with the patient and we discussed the procedure of radiofrequency device localization including benefits and alternatives. We discussed the high likelihood of a successful procedure. We discussed the risks of the procedure including infection, bleeding, tissue injury and further surgery. Informed, written consent was given. The usual time-out protocol was performed immediately prior to the procedure. Using mammographic guidance, sterile technique with chlorhexidine as skin antisepsis, 1% lidocaine as local anesthesia, a SAVI SCOUT radiofrequency device was used to localize the ribbon shaped tissue marking clip associated with the mass in the outer RIGHT breast using a lateral approach. The follow-up mammogram images confirm that  the RF device is appropriately positioned immediately adjacent to the clip/mass. The images are marked for Dr. Windell Moment. Follow-up survey of the patient confirms the presence of the RF device. The patient tolerated the procedure well with no apparent immediate complications. She was released from the [facility]. IMPRESSION: SAVI SCOUT radiofrequency device localization of the RIGHT breast. No apparent complications. Electronically Signed   By: Evangeline Dakin M.D.   On: 08/19/2022 16:15  [place] RT BREAST BX W LOC DEV 1ST LESION IMG BX SPEC US GUIDE  Addendum Date: 07/16/2022   ADDENDUM REPORT: 07/16/2022 12:50 ADDENDUM: PATHOLOGY revealed: A. BREAST, RIGHT AT 9:00, 7 CM FROM NIPPLE; ULTRASOUND-GUIDED CORE NEEDLE BIOPSY: - FIBROEPITHELIAL PROLIFERATION WITH SCLEROSIS. - NEGATIVE FOR DUCTAL CARCINOMA IN SITU AND MALIGNANCY. Comment: The histologic features favor an intraductal papilloma with sclerosis, apocrine metaplasia, and focal atypical ductal hyperplasia, but the differential diagnosis may also include a complex fibroadenoma or complex sclerosing lesion. Definitive classification and exclusion of atypia is dependent upon the final resection. Pathology results are CONCORDANT with imaging findings, per Dr. Valentino Saxon with excision recommended. Pathology results and recommendations were discussed with patient via telephone on 07/17/2022. Patient reported biopsy site doing well with no adverse symptoms, and only slight tenderness at the site. Post biopsy care instructions were reviewed, questions were answered and my direct phone number was provided. Patient was instructed to call [facility] for any additional questions or concerns  related to biopsy site. RECOMMENDATIONS: Surgical consultation for excision. Request for surgical consultation relayed to Casper Harrison RN at [facility] by Electa Sniff RN on 07/17/2022. Pathology results reported by Electa Sniff RN on  07/16/2022. Electronically Signed   By: Valentino Saxon M.D.   On: 07/16/2022 12:50   Result Date: 07/16/2022 CLINICAL DATA:  Indeterminate RIGHT breast mass EXAM: ULTRASOUND GUIDED RIGHT BREAST CORE NEEDLE BIOPSY COMPARISON:  Previous exam(s). PROCEDURE: I met with the patient and we discussed the procedure of ultrasound-guided biopsy, including benefits and alternatives. We discussed the high likelihood of a successful procedure. We discussed the risks of the procedure, including infection, bleeding, tissue injury, clip migration, and inadequate sampling. Informed written consent was given. The usual time-out protocol was performed immediately prior to the procedure. Lesion quadrant: Upper outer quadrant Using sterile technique and 1% Lidocaine as local anesthetic, under direct ultrasound visualization, a 14 gauge spring-loaded device was used to perform biopsy of a mass at 9 o'clock using an inferior approach. At the conclusion of the procedure a RIBBON shaped tissue marker clip was deployed into the biopsy cavity. Follow up 2 view mammogram was performed and dictated separately. IMPRESSION: Ultrasound guided biopsy of a RIGHT breast mass. No apparent complications. Electronically Signed: By: Valentino Saxon M.D. On: 07/14/2022 09:14   MM CLIP PLACEMENT RIGHT  Result Date: 07/14/2022 CLINICAL DATA:  Status post ultrasound-guided biopsy EXAM: 3D DIAGNOSTIC RIGHT MAMMOGRAM POST ULTRASOUND BIOPSY COMPARISON:  Previous exam(s). FINDINGS: 3D Mammographic images were obtained following ultrasound guided biopsy of a mass at 9 o'clock. The RIBBON biopsy marking clip is in expected position at the site of biopsy. Biopsy marking clip falls at the site of mammographic concern. IMPRESSION: Appropriate positioning of the RIBBON shaped biopsy marking clip at the site of biopsy in the outer breast. Final Assessment: Post Procedure Mammograms for Marker Placement Electronically Signed   By: Valentino Saxon M.D.    On: 07/14/2022 09:13  MM DIAG BREAST TOMO BILATERAL  Result Date: 06/25/2022 CLINICAL DATA:  61 year old female presenting for evaluation of a mass seen in the right breast at 9 o'clock, 7 cm from the nipple on a HERSCAN exam done in an outside facility. EXAM: DIGITAL DIAGNOSTIC BILATERAL MAMMOGRAM WITH TOMOSYNTHESIS; ULTRASOUND RIGHT BREAST LIMITED TECHNIQUE: Bilateral digital diagnostic mammography and breast tomosynthesis was performed.; Targeted ultrasound examination of the right breast was performed COMPARISON:  Previous exam(s). ACR Breast Density Category b: There are scattered areas of fibroglandular density. FINDINGS: The patient has multiple bilateral oval circumscribed masses. There is one mass in the lateral mid to anterior depth of the right breast which has an internal calcification. Mammographically, the mass measures approximately 5 mm. No other suspicious calcifications, masses or areas of distortion are seen in the bilateral breasts. Ultrasound targeted to the right breast at 9 o'clock, 7 cm from the nipple demonstrates an irregular hypoechoic mass with indistinct margins measuring 7 x 4 x 5 mm. Ultrasound of the right axilla demonstrates multiple normal-appearing lymph nodes. IMPRESSION: 1. There is an indeterminate 7 mm mass in the right breast at 9 o'clock. 2.  No evidence of right axillary lymphadenopathy. 3.  No evidence of left breast malignancy. RECOMMENDATION: 1. Ultrasound-guided biopsy is recommended for the right breast mass at 9 o'clock. Close attention to the post biopsy mammogram is recommended to ensure that the clip corresponds with the mass with the calcification. If it does not, the patient is aware that stereotactic biopsy would next be recommended. I have discussed  the findings and recommendations with the patient. If applicable, a reminder letter will be sent to the patient regarding the next appointment. BI-RADS CATEGORY  4: Suspicious. Electronically Signed   By:  Ammie Ferrier M.D.   On: 06/25/2022 14:45  US BREAST LTD UNI RIGHT INC AXILLA  Result Date: 06/25/2022 CLINICAL DATA:  61 year old female presenting for evaluation of a mass seen in the right breast at 9 o'clock, 7 cm from the nipple on a HERSCAN exam done in an outside facility. EXAM: DIGITAL DIAGNOSTIC BILATERAL MAMMOGRAM WITH TOMOSYNTHESIS; ULTRASOUND RIGHT BREAST LIMITED TECHNIQUE: Bilateral digital diagnostic mammography and breast tomosynthesis was performed.; Targeted ultrasound examination of the right breast was performed COMPARISON:  Previous exam(s). ACR Breast Density Category b: There are scattered areas of fibroglandular density. FINDINGS: The patient has multiple bilateral oval circumscribed masses. There is one mass in the lateral mid to anterior depth of the right breast which has an internal calcification. Mammographically, the mass measures approximately 5 mm. No other suspicious calcifications, masses or areas of distortion are seen in the bilateral breasts. Ultrasound targeted to the right breast at 9 o'clock, 7 cm from the nipple demonstrates an irregular hypoechoic mass with indistinct margins measuring 7 x 4 x 5 mm. Ultrasound of the right axilla demonstrates multiple normal-appearing lymph nodes. IMPRESSION: 1. There is an indeterminate 7 mm mass in the right breast at 9 o'clock. 2.  No evidence of right axillary lymphadenopathy. 3.  No evidence of left breast malignancy. RECOMMENDATION: 1. Ultrasound-guided biopsy is recommended for the right breast mass at 9 o'clock. Close attention to the post biopsy mammogram is recommended to ensure that the clip corresponds with the mass with the calcification. If it does not, the patient is aware that stereotactic biopsy would next be recommended. I have discussed the findings and recommendations with the patient. If applicable, a reminder letter will be sent to the patient regarding the next appointment. BI-RADS CATEGORY  4: Suspicious.  Electronically Signed   By: Ammie Ferrier M.D.   On: 06/25/2022 14:45  MM Outside Films Mammo  Result Date: 06/09/2022 This examination belongs to an outside facility and is stored here for comparison purposes only.  Contact the originating outside institution for any associated report or interpretation.      ASSESSMENT & PLAN:   Family history of breast cancer Refer patient to establish care with genetic counselor.    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  I would like to further review her case on tumor board.   We discussed about the option of active surveillance vs chemo prevention with aromatase inhibitor if focal ADH is confirmed. Consider DEXA for evaluation of bone density.   Will further discuss with patient after tumor board discussion.   Erythrocytosis Chronic, she is non smoker.  Recommend erythrocytosis work up in the future.    Addendum Patient case was presented on breast cancer multidisciplinary tumor board.  Confirmed with Dr. Reuel Derby that there is a very small focal site of atypical ductal hyperplasia. I called patient on 09/14/2022 and spoke with patient.  Patient has an option of doing chemoprevention or to continue active surveillance.  Patient hung up on me. I called back and not able to reach her.  Orders Placed This Encounter  Procedures   Ambulatory referral to Genetics    Referral Priority:   Routine  Referral Type:   Consultation    Referral Reason:   Specialty Services Required    Number of Visits Requested:   1   Follow up to be determined.   All questions were answered. The patient knows to call the clinic with any problems, questions or concerns.  Jon Billings, NP   Thank you for this kind referral and the opportunity to  participate in the care of this patient. A copy of today's note is routed to referring provider   Earlie Server, MD, PhD [facility] Hematology Oncology 08/28/2022

## 2022-08-28 NOTE — Assessment & Plan Note (Addendum)
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  I would like to further review her case on tumor board.   We discussed about the option of active surveillance vs chemo prevention with aromatase inhibitor if focal ADH is confirmed. Consider DEXA for evaluation of bone density.   Will further discuss with patient after tumor board discussion.

## 2022-08-28 NOTE — Assessment & Plan Note (Signed)
Chronic, she is non smoker.  Recommend erythrocytosis work up in the future.

## 2022-09-08 ENCOUNTER — Inpatient Hospital Stay: Payer: 59

## 2022-09-14 ENCOUNTER — Encounter: Payer: Self-pay | Admitting: Oncology

## 2022-09-15 ENCOUNTER — Encounter: Payer: Self-pay | Admitting: Nurse Practitioner

## 2022-09-16 NOTE — Telephone Encounter (Addendum)
Please schedule pt for MD only (in person or Mychart)- pt preference

## 2022-09-24 ENCOUNTER — Encounter: Payer: 59 | Admitting: Licensed Clinical Social Worker

## 2022-09-25 ENCOUNTER — Inpatient Hospital Stay (HOSPITAL_BASED_OUTPATIENT_CLINIC_OR_DEPARTMENT_OTHER): Payer: 59 | Admitting: Licensed Clinical Social Worker

## 2022-09-25 ENCOUNTER — Encounter: Payer: Self-pay | Admitting: Licensed Clinical Social Worker

## 2022-09-25 ENCOUNTER — Encounter: Payer: Self-pay | Admitting: Oncology

## 2022-09-25 ENCOUNTER — Other Ambulatory Visit (HOSPITAL_COMMUNITY): Payer: Self-pay

## 2022-09-25 ENCOUNTER — Inpatient Hospital Stay: Payer: 59

## 2022-09-25 ENCOUNTER — Other Ambulatory Visit: Payer: Self-pay

## 2022-09-25 ENCOUNTER — Inpatient Hospital Stay (HOSPITAL_BASED_OUTPATIENT_CLINIC_OR_DEPARTMENT_OTHER): Payer: 59 | Admitting: Oncology

## 2022-09-25 VITALS — BP 148/85 | HR 82 | Temp 97.6°F | Resp 18

## 2022-09-25 DIAGNOSIS — Z794 Long term (current) use of insulin: Secondary | ICD-10-CM | POA: Diagnosis not present

## 2022-09-25 DIAGNOSIS — Z803 Family history of malignant neoplasm of breast: Secondary | ICD-10-CM

## 2022-09-25 DIAGNOSIS — Z800 Family history of malignant neoplasm of digestive organs: Secondary | ICD-10-CM

## 2022-09-25 DIAGNOSIS — E559 Vitamin D deficiency, unspecified: Secondary | ICD-10-CM | POA: Diagnosis not present

## 2022-09-25 DIAGNOSIS — Z9189 Other specified personal risk factors, not elsewhere classified: Secondary | ICD-10-CM

## 2022-09-25 DIAGNOSIS — Z7984 Long term (current) use of oral hypoglycemic drugs: Secondary | ICD-10-CM | POA: Diagnosis not present

## 2022-09-25 DIAGNOSIS — Z8052 Family history of malignant neoplasm of bladder: Secondary | ICD-10-CM | POA: Diagnosis not present

## 2022-09-25 DIAGNOSIS — N6099 Unspecified benign mammary dysplasia of unspecified breast: Secondary | ICD-10-CM

## 2022-09-25 DIAGNOSIS — Z79899 Other long term (current) drug therapy: Secondary | ICD-10-CM | POA: Diagnosis not present

## 2022-09-25 DIAGNOSIS — N6091 Unspecified benign mammary dysplasia of right breast: Secondary | ICD-10-CM | POA: Diagnosis not present

## 2022-09-25 DIAGNOSIS — E119 Type 2 diabetes mellitus without complications: Secondary | ICD-10-CM | POA: Diagnosis not present

## 2022-09-25 DIAGNOSIS — L400 Psoriasis vulgaris: Secondary | ICD-10-CM | POA: Diagnosis not present

## 2022-09-25 DIAGNOSIS — D751 Secondary polycythemia: Secondary | ICD-10-CM | POA: Diagnosis not present

## 2022-09-25 DIAGNOSIS — E782 Mixed hyperlipidemia: Secondary | ICD-10-CM | POA: Diagnosis not present

## 2022-09-25 NOTE — Progress Notes (Addendum)
Hematology/Oncology Consult note [phone] [phone]         Patient Care Team: Holdsworth, Karen, NP as PCP - General (Nurse Practitioner) Gherghe, Cristina, MD as Consulting Physician (Endocrinology) Shambley, Melody N, CNM (Inactive) as Midwife (Obstetrics and Gynecology)  ASSESSMENT & PLAN:   Atypical ductal hyperplasia of breast Initial right breast biopsy showed proliferation with sclerosis, favor intraductal papilloma with sclerosis with atypical ductal hyperplasia. 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  Case was discussed on breast tumor board and pathologist confirms a very small area of ADH.  We discussed about the option of active surveillance vs chemo prevention with aromatase inhibitor.  Patient elected to proceed with surveillance only.  Recommend life time annual screening mammogram and physical examination.  She will follow up with Dr. Cintron for surveillance.   Family history of breast cancer established care with genetic counselor and genetic testing is pending.    Follow up as needed.  All questions were answered. The patient knows to call the clinic with any problems, questions or concerns.   , MD, PhD [facility] 09/25/2022    CHIEF COMPLAINTS/REASON FOR VISIT:  atypical ductal hyperplasia  HISTORY OF PRESENTING ILLNESS:   Cassandra Munoz is a  61 y.o.  female with PMH listed below was seen in consultation at the request of  Holdsworth, Karen, NP  for evaluation of atypical ductal hyperplasia  06/25/22 bilateral diagnostic mammogram and ultrasound showed  1. There is an indeterminate 7 mm mass in the right breast at 9 o'clock. 2.  No evidence of right axillary lymphadenopathy. 3.  No evidence of left breast malignancy.  07/14/22 patient underwent right breast mass biopsy  Pathology showed fibroepithelial proliferation with sclerosis. Negative for ductal  carcinoma in situ and malignancy.  The histologic features favor an intraductal papilloma with sclerosis, apocrine metaplasia, and focal atypical ductal hyperplasia, but the differential diagnosis may also include a complex fibroadenoma or complex sclerosing lesion   Patient established care with Dr.Cintron and underwent excision.    08/20/22  Right breast excision pathology showed benign mammary parenchyma displaying residual dense sclerosis with associated usual ductal hyperplasia, likely representing residual sclerosed intraductal papilloma with post biopsy changes.  Background benign mammary parenchyma with mild stromal fibrosis, fibrocystic and apocrine changes and focal usual ductal hyperplasia. Negative for residual atypical proliferative breast disease.   Patient was referred to oncology for further evaluation.   + Family history of breast cancer and colin cancer,  + history of TAH +BSO in 2016.   Menarche 11-12 yo History of OCP use x 5 years,  Denies HRT Denies any other breast biopsy.   INTERVAL HISTORY Cassandra Munoz is a 61 y.o. female who has above history reviewed by me today presents for follow up visit for atypical ductal hyperplasia.  She has no new complaints. Genetic testing is pending    MEDICAL HISTORY:  Past Medical History:  Diagnosis Date   Atypical mole 02/08/2020   Left forearm. Melanocytic proliferation of uncertain malignant potential, base involved.   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   Psoriasis    Sciatic pain 2015   Vitamin D deficiency     SURGICAL HISTORY: Past Surgical History:  Procedure Laterality Date   ABDOMINAL HYSTERECTOMY N/A 05/07/2015   Procedure: HYSTERECTOMY ABDOMINAL;  Surgeon: Martin A Defrancesco, MD;  Location: [facility] ORS;  Service: Gynecology;    Laterality: N/A;   ANKLE ARTHROSCOPY Right    BREAST  BIOPSY Right 07/14/2022   us biopsy/ ribbon clip/ path pending   BREAST BIOPSY WITH RADIO FREQUENCY LOCALIZER Right 08/20/2022   Procedure: BREAST BIOPSY WITH RADIO FREQUENCY LOCALIZER;  Surgeon: Cintron-Diaz, Edgardo, MD;  Location: [facility] ORS;  Service: General;  Laterality: Right;   COLONOSCOPY WITH PROPOFOL N/A 04/03/2016   Procedure: COLONOSCOPY WITH PROPOFOL;  Surgeon: Robert T Elliott, MD;  Location: [facility] ENDOSCOPY;  Service: Endoscopy;  Laterality: N/A;   ROBOTIC ASSISTED LAPAROSCOPIC VAGINAL HYSTERECTOMY WITH FIBROID REMOVAL     Fibroid removal   TONSILLECTOMY AND ADENOIDECTOMY     as a chaild    SOCIAL HISTORY: Social History   Socioeconomic History   Marital status: Married    Spouse name: Not on file   Number of children: Not on file   Years of education: Not on file   Highest education level: Not on file  Occupational History   Not on file  Tobacco Use   Smoking status: Never   Smokeless tobacco: Never  Vaping Use   Vaping Use: Never used  Substance and Sexual Activity   Alcohol use: No    Alcohol/week: 0.0 standard drinks of alcohol    Comment: maybe 1 drink/month - socially   Drug use: No   Sexual activity: Yes    Birth control/protection: None  Other Topics Concern   Not on file  Social History Narrative   [facility]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   Occasional alcohol use   1 miscarriage   Last PAP: 2018 ([facility])   Mammogram screening: 2013 (Normal/[place])      Social Determinants of Health   Financial Resource Strain: Not on file  Food Insecurity: Not on file  Transportation Needs: Not on file  Physical Activity: Not on file  Stress: Not on file  Social Connections: Not on file  Intimate Partner Violence: Not on file    FAMILY HISTORY: Family History  Problem Relation Age of Onset   Diabetes Mother    Hypertension Mother    Colon cancer Father 65   Heart disease Father    Hypertension Father    Cancer Father    Prostate cancer Father        dx 70s    Colon cancer Sister    Hypertension Brother    Hyperlipidemia Brother    Diabetes Brother    Bladder Cancer Brother        dx 60s   Heart failure Brother        had pace maker put in   Breast cancer Paternal Aunt        dx 70s   Breast cancer Paternal Aunt        dx 70s   Breast cancer Paternal Aunt    Cancer Cousin        either breast or colon   Cancer Cousin        unk type   Breast cancer Cousin     ALLERGIES:  is allergic to clobetasol.  MEDICATIONS:  Current Outpatient Medications  Medication Sig Dispense Refill   azelastine (ASTELIN) 0.1 % nasal spray One to two spray each nostril  q 12 prn  drainage 30 mL 1   dapagliflozin propanediol (FARXIGA) 10 MG TABS tablet Take 1 tablet (10 mg total) by mouth daily every morning 90 tablet 3   ezetimibe (ZETIA) 10 MG tablet Take 1 tablet (10 mg total) by mouth daily. 90 tablet 3     glucose blood test strip USE TO CHECK BLOOD SUGAR 2 TIMES A DAY. Freestyle lite 200 strip 12   Lancets (FREESTYLE) lancets Use to check blood sugar 2 times a day. 200 each 12   Multiple Vitamin (MULTI-VITAMINS) TABS Take by mouth.     nystatin-triamcinolone (MYCOLOG II) cream Apply 1 application topically 2 (two) times daily. Use for 10-14 days 30 g 2   ondansetron (ZOFRAN) 8 MG tablet Take 1 tablet (8 mg total) by mouth every 8 (eight) hours as needed for nausea or vomiting. 20 tablet 1   pramoxine (SARNA SENSITIVE) 1 % LOTN Apply to affected areas once or twice daily as needed 237 mL 0   Risankizumab-rzaa (SKYRIZI) 150 MG/ML SOSY Inject 150 mg SQ every 12 weeks 1 mL 4   Semaglutide, 2 MG/DOSE, (OZEMPIC, 2 MG/DOSE,) 8 MG/3ML SOPN Inject 2 mg into the skin once a week. 3 mL 3   triamcinolone (KENALOG) 0.147 MG/GM topical spray Apply topically once or twice daily until clear then as needed 100 g 3   vitamin B-12 (CYANOCOBALAMIN) 100 MCG tablet Take 100 mcg by mouth daily.     No current facility-administered medications for this visit.    Review of  Systems  Constitutional:  Negative for appetite change, chills, fatigue and fever.  HENT:   Negative for hearing loss and voice change.   Eyes:  Negative for eye problems.  Respiratory:  Negative for chest tightness and cough.   Cardiovascular:  Negative for chest pain.  Gastrointestinal:  Negative for abdominal distention, abdominal pain and blood in stool.  Endocrine: Negative for hot flashes.  Genitourinary:  Negative for difficulty urinating and frequency.   Musculoskeletal:  Negative for arthralgias.  Skin:  Negative for itching and rash.  Neurological:  Negative for extremity weakness.  Hematological:  Negative for adenopathy.  Psychiatric/Behavioral:  Negative for confusion.    PHYSICAL EXAMINATION: ECOG PERFORMANCE STATUS: 0 - Asymptomatic Vitals:   09/25/22 1126  BP: (!) 148/85  Pulse: 82  Resp: 18  Temp: 97.6 F (36.4 C)   Filed Weights    Physical Exam Constitutional:      General: She is not in acute distress. HENT:     Head: Normocephalic and atraumatic.  Eyes:     General: No scleral icterus. Cardiovascular:     Rate and Rhythm: Normal rate and regular rhythm.     Heart sounds: Normal heart sounds.  Pulmonary:     Effort: Pulmonary effort is normal. No respiratory distress.     Breath sounds: No wheezing.  Abdominal:     General: Bowel sounds are normal. There is no distension.     Palpations: Abdomen is soft.  Musculoskeletal:        General: No deformity. Normal range of motion.     Cervical back: Normal range of motion and neck supple.  Skin:    General: Skin is warm and dry.     Findings: No erythema or rash.  Neurological:     Mental Status: She is alert and oriented to person, place, and time. Mental status is at baseline.     Cranial Nerves: No cranial nerve deficit.     Coordination: Coordination normal.  Psychiatric:        Mood and Affect: Mood normal.     LABORATORY DATA:  I have reviewed the data as listed    Latest Ref Rng &  Units 08/01/2022    8:27 AM 01/28/2022    8:47 AM 11/05/2021  10:10 AM  CBC  WBC 4.0 - 10.5 K/uL 6.8  6.6  6.0   Hemoglobin 12.0 - 15.0 g/dL 15.3  16.2  16.0   Hematocrit 36.0 - 46.0 % 46.0  49.9  48.0   Platelets 150 - 400 K/uL 240  238  253       Latest Ref Rng & Units 08/01/2022    8:27 AM 04/10/2022   10:05 AM 01/28/2022    8:47 AM  CMP  Glucose 70 - 99 mg/dL 126  111  130   BUN 8 - 23 mg/dL 17  16  13   Creatinine 0.44 - 1.00 mg/dL 0.75  0.72  0.94   Sodium 135 - 145 mmol/L 136  140  140   Potassium 3.5 - 5.1 mmol/L 4.0  4.5  4.2   Chloride 98 - 111 mmol/L 107  104  104   CO2 22 - 32 mmol/L 26  20  21   Calcium 8.9 - 10.3 mg/dL 9.0  9.3  9.4   Total Protein 6.5 - 8.1 g/dL 7.7  7.0  7.5   Total Bilirubin 0.3 - 1.2 mg/dL 0.5  0.3  0.3   Alkaline Phos 38 - 126 U/L 45  58  59   AST 15 - 41 U/L 37  30  29   ALT 0 - 44 U/L 62  52  47       RADIOGRAPHIC STUDIES: I have personally reviewed the radiological images as listed and agreed with the findings in the report. MM Breast Surgical Specimen  Result Date: 08/20/2022 CLINICAL DATA:  Status post Savi Scout localized RIGHT breast lumpectomy. EXAM: SPECIMEN RADIOGRAPH OF THE RIGHT BREAST COMPARISON:  Previous exam(s). FINDINGS: Status post excision of the RIGHT breast. The Savi Scout reflector and RIBBON shaped clip are present within the specimen. IMPRESSION: Specimen radiograph of the RIGHT breast. Electronically Signed   By: Stephanie  Peacock M.D.   On: 08/20/2022 08:58  MM RT RADIO FREQUENCY TAG LOC MAMMO GUIDE  Result Date: 08/19/2022 CLINICAL DATA:  61-year-old with biopsy-proven fibroepithelial proliferation with sclerosis, possibly representing an intraductal papilloma with sclerosis and focal atypical ductal hyperplasia involving the outer RIGHT breast; differential diagnosis per pathology included a complex fibroadenoma or complex sclerosing lesion. Excisional biopsy was recommended for definitive diagnosis. SAVI SCOUT  radiofrequency device localization is performed in anticipation of the excisional biopsy. EXAM: MAMMOGRAPHIC GUIDED RADIOFREQUENCY DEVICE LOCALIZATION OF THE RIGHT BREAST COMPARISON:  Previous exam(s). FINDINGS: Patient presents for radiofrequency device localization prior to RIGHT breast excisional biopsy. I met with the patient and we discussed the procedure of radiofrequency device localization including benefits and alternatives. We discussed the high likelihood of a successful procedure. We discussed the risks of the procedure including infection, bleeding, tissue injury and further surgery. Informed, written consent was given. The usual time-out protocol was performed immediately prior to the procedure. Using mammographic guidance, sterile technique with chlorhexidine as skin antisepsis, 1% lidocaine as local anesthesia, a SAVI SCOUT radiofrequency device was used to localize the ribbon shaped tissue marking clip associated with the mass in the outer RIGHT breast using a lateral approach. The follow-up mammogram images confirm that the RF device is appropriately positioned immediately adjacent to the clip/mass. The images are marked for Dr. Cintron-Diaz. Follow-up survey of the patient confirms the presence of the RF device. The patient tolerated the procedure well with no apparent immediate complications. She was released from the [facility]. IMPRESSION: SAVI SCOUT radiofrequency device localization of the   RIGHT breast. No apparent complications. Electronically Signed   By: Thomas  Lawrence M.D.   On: 08/19/2022 16:15  US RT BREAST BX W LOC DEV 1ST LESION IMG BX SPEC US GUIDE  Addendum Date: 07/16/2022   ADDENDUM REPORT: 07/16/2022 12:50 ADDENDUM: PATHOLOGY revealed: A. BREAST, RIGHT AT 9:00, 7 CM FROM NIPPLE; ULTRASOUND-GUIDED CORE NEEDLE BIOPSY: - FIBROEPITHELIAL PROLIFERATION WITH SCLEROSIS. - NEGATIVE FOR DUCTAL CARCINOMA IN SITU AND MALIGNANCY. Comment: The histologic features favor an  intraductal papilloma with sclerosis, apocrine metaplasia, and focal atypical ductal hyperplasia, but the differential diagnosis may also include a complex fibroadenoma or complex sclerosing lesion. Definitive classification and exclusion of atypia is dependent upon the final resection. Pathology results are CONCORDANT with imaging findings, per Dr. Stephanie Peacock with excision recommended. Pathology results and recommendations were discussed with patient via telephone on 07/17/2022. Patient reported biopsy site doing well with no adverse symptoms, and only slight tenderness at the site. Post biopsy care instructions were reviewed, questions were answered and my direct phone number was provided. Patient was instructed to call [facility] for any additional questions or concerns related to biopsy site. RECOMMENDATIONS: Surgical consultation for excision. Request for surgical consultation relayed to Ana Moore RN at [facility] by Linda Nash RN on 07/17/2022. Pathology results reported by Linda Nash RN on 07/16/2022. Electronically Signed   By: Stephanie  Peacock M.D.   On: 07/16/2022 12:50   Result Date: 07/16/2022 CLINICAL DATA:  Indeterminate RIGHT breast mass EXAM: ULTRASOUND GUIDED RIGHT BREAST CORE NEEDLE BIOPSY COMPARISON:  Previous exam(s). PROCEDURE: I met with the patient and we discussed the procedure of ultrasound-guided biopsy, including benefits and alternatives. We discussed the high likelihood of a successful procedure. We discussed the risks of the procedure, including infection, bleeding, tissue injury, clip migration, and inadequate sampling. Informed written consent was given. The usual time-out protocol was performed immediately prior to the procedure. Lesion quadrant: Upper outer quadrant Using sterile technique and 1% Lidocaine as local anesthetic, under direct ultrasound visualization, a 14 gauge spring-loaded device was used to perform biopsy of a mass at 9  o'clock using an inferior approach. At the conclusion of the procedure a RIBBON shaped tissue marker clip was deployed into the biopsy cavity. Follow up 2 view mammogram was performed and dictated separately. IMPRESSION: Ultrasound guided biopsy of a RIGHT breast mass. No apparent complications. Electronically Signed: By: Stephanie  Peacock M.D. On: 07/14/2022 09:14   MM CLIP PLACEMENT RIGHT  Result Date: 07/14/2022 CLINICAL DATA:  Status post ultrasound-guided biopsy EXAM: 3D DIAGNOSTIC RIGHT MAMMOGRAM POST ULTRASOUND BIOPSY COMPARISON:  Previous exam(s). FINDINGS: 3D Mammographic images were obtained following ultrasound guided biopsy of a mass at 9 o'clock. The RIBBON biopsy marking clip is in expected position at the site of biopsy. Biopsy marking clip falls at the site of mammographic concern. IMPRESSION: Appropriate positioning of the RIBBON shaped biopsy marking clip at the site of biopsy in the outer breast. Final Assessment: Post Procedure Mammograms for Marker Placement Electronically Signed   By: Stephanie  Peacock M.D.   On: 07/14/2022 09:13     

## 2022-09-25 NOTE — Assessment & Plan Note (Signed)
established care with genetic counselor and genetic testing is pending.

## 2022-09-25 NOTE — Progress Notes (Signed)
REFERRING PROVIDER: Earlie Server, MD [place],  [place] [address]  PRIMARY PROVIDER:  Jon Billings, NP  PRIMARY REASON FOR VISIT:  1. Family history of breast cancer   2. At high risk for breast cancer   3. Family history of colon cancer   4. Family history of bladder cancer      HISTORY OF PRESENT ILLNESS:   Cassandra Munoz, a 61 y.o. female, was seen for a [place] cancer genetics consultation at the request of Dr. Tasia Catchings due to a family history of cancer.  Cassandra Munoz presents to clinic today to discuss the possibility of a hereditary predisposition to cancer, genetic testing, and to further clarify her future cancer risks, as well as potential cancer risks for family members.   CANCER HISTORY:  Cassandra Munoz is a 61 y.o. female with no personal history of cancer.    RISK FACTORS:  Menarche was at age 64-13  OCP use for approximately 4 years.  Ovaries intact: no.  Hysterectomy: yes.  Menopausal status: postmenopausal.  HRT use: 0 years. Colonoscopy: yes; normal. Mammogram within the last year: yes. Number of breast biopsies:1 - ADH   Past Medical History:  Diagnosis Date   Atypical mole 02/08/2020   Left forearm. Melanocytic proliferation of uncertain malignant potential, base involved.   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   Psoriasis    Sciatic pain 2015   Vitamin D deficiency     Past Surgical History:  Procedure Laterality Date   ABDOMINAL HYSTERECTOMY N/A 05/07/2015   Procedure: HYSTERECTOMY ABDOMINAL;  Surgeon: Brayton Mars, MD;  Location: [facility] ORS;  Service: Gynecology;  Laterality: N/A;   ANKLE ARTHROSCOPY Right    BREAST BIOPSY Right 07/14/2022   US biopsy/ ribbon clip/ path pending   BREAST BIOPSY WITH RADIO FREQUENCY LOCALIZER Right 08/20/2022   Procedure: BREAST BIOPSY WITH RADIO FREQUENCY LOCALIZER;   Surgeon: Herbert Pun, MD;  Location: [facility] ORS;  Service: General;  Laterality: Right;   COLONOSCOPY WITH PROPOFOL N/A 04/03/2016   Procedure: COLONOSCOPY WITH PROPOFOL;  Surgeon: Manya Silvas, MD;  Location: [facility] ENDOSCOPY;  Service: Endoscopy;  Laterality: N/A;   ROBOTIC ASSISTED LAPAROSCOPIC VAGINAL HYSTERECTOMY WITH FIBROID REMOVAL     Fibroid removal   TONSILLECTOMY AND ADENOIDECTOMY     as a chaild    FAMILY HISTORY:  We obtained a detailed, 4-generation family history.  Significant diagnoses are listed below: Family History  Problem Relation Age of Onset   Diabetes Mother    Hypertension Mother    Colon cancer Father 27   Heart disease Father    Hypertension Father    Cancer Father    Prostate cancer Father        dx 67s   Colon cancer Sister    Hypertension Brother    Hyperlipidemia Brother    Diabetes Brother    Bladder Cancer Brother        dx 28s   Heart failure Brother        had pace maker put in   Breast cancer Paternal Aunt        dx 38s   Breast cancer Paternal Aunt        dx 47s   Breast cancer Paternal Aunt    Cancer Cousin        either breast or colon   Cancer Cousin  unk type   Breast cancer Cousin    Cassandra Munoz has 1 sister and 2 brothers. One brother had early stage bladder cancer in his 30s and is living at 21. Her sister has intellectual disability and was diagnosed with colon cancer, early stage, unsure at what age.   Cassandra Munoz's mother passed last year at 81. No known cancers on this side of the family.  Cassandra Munoz's father had colon cancer initially diagnosed at 7, prostate cancer diagnosed in his 49s, and then passed of metastatic colon cancer in his 90s. Patient had 3 paternal aunts and 3 paternal uncles. All three aunts had breast cancer, two diagnosed in their 69s and both passed of the cancer. The other aunt had a double mastectomy and passed of an aneurysm. An uncle possibly had cancer. Another uncle had 3  children with cancer, one had either breast or colon, another had breast, and the other cousin is a female, patient unsure what type of cancer he passed from.   Cassandra Munoz is unaware of previous family history of genetic testing for hereditary cancer risks. There is no reported Ashkenazi Jewish ancestry. There is no known consanguinity.    GENETIC COUNSELING ASSESSMENT: Cassandra Munoz is a 61 y.o. female with a family history of breast and colon cancer which is somewhat suggestive of a hereditary cancer syndrome and predisposition to cancer. We, therefore, discussed and recommended the following at today's visit.   DISCUSSION: We discussed that approximately 10% of breast cancer is hereditary. Most cases of hereditary breast cancer are associated with BRCA1/BRCA2 genes, although there are other genes associated with hereditary cancer as well. Cancers and risks are gene specific. We discussed that testing is beneficial for several reasons including knowing about cancer risks, identifying potential screening and risk-reduction options that may be appropriate, and to understand if other family members could be at risk for cancer and allow them to undergo genetic testing.   We reviewed the characteristics, features and inheritance patterns of hereditary cancer syndromes. We also discussed genetic testing, including the appropriate family members to test, the process of testing, insurance coverage and turn-around-time for results. We discussed the implications of a negative, positive and/or variant of uncertain significant result. We recommended Cassandra Munoz pursue genetic testing for the [facility] CancerNext-Expanded+RNA gene panel.   Based on Cassandra Munoz's family history of cancer, she meets medical criteria for genetic testing. Despite that she meets criteria, she may still have an out of pocket cost. We discussed that if her out of pocket cost for testing is over $100, the laboratory will call and confirm  whether she wants to proceed with testing.  If the out of pocket cost of testing is less than $100 she will be billed by the genetic testing laboratory.   PLAN: After considering the risks, benefits, and limitations, Cassandra Munoz provided informed consent to pursue genetic testing and the blood sample was sent to [facility] for analysis of the CancerNext-Expanded+RNA panel. Results should be available within approximately 2-3 weeks' time, at which point they will be disclosed by telephone to Cassandra Munoz, as will any additional recommendations warranted by these results. Cassandra Munoz will receive a summary of her genetic counseling visit and a copy of her results once available. This information will also be available in Epic.   Cassandra Munoz's questions were answered to her satisfaction today. Our contact information was provided should additional questions or concerns arise. Thank you for the referral and allowing [place] to share in the  care of your patient.   Faith Rogue, MS, [facility] Genetic Counselor [email or web address] Phone: [phone]  The patient was seen for a total of 30 minutes in face-to-face genetic counseling.  Dr. Grayland Ormond was available for discussion regarding this case.   _______________________________________________________________________ For Office Staff:  Number of people involved in session: 1 Was an Intern/ student involved with case: no

## 2022-09-25 NOTE — Assessment & Plan Note (Addendum)
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  Case was discussed on breast tumor board and pathologist confirms a very small area of ADH.  We discussed about the option of active surveillance vs chemo prevention with aromatase inhibitor.  Patient elected to proceed with surveillance only.  Recommend life time annual screening mammogram and physical examination.  She will follow up with Dr. Peyton Najjar for surveillance.

## 2022-10-01 ENCOUNTER — Other Ambulatory Visit (HOSPITAL_COMMUNITY): Payer: Self-pay

## 2022-10-02 ENCOUNTER — Other Ambulatory Visit (HOSPITAL_COMMUNITY): Payer: Self-pay

## 2022-10-03 ENCOUNTER — Other Ambulatory Visit: Payer: Self-pay

## 2022-10-06 ENCOUNTER — Other Ambulatory Visit (HOSPITAL_COMMUNITY): Payer: Self-pay

## 2022-10-07 ENCOUNTER — Other Ambulatory Visit: Payer: Self-pay

## 2022-10-07 MED FILL — Semaglutide Soln Pen-inj 2 MG/DOSE (8 MG/3ML): SUBCUTANEOUS | 28 days supply | Qty: 3 | Fill #2 | Status: AC

## 2022-10-08 ENCOUNTER — Other Ambulatory Visit: Payer: Self-pay

## 2022-10-13 ENCOUNTER — Ambulatory Visit: Payer: Self-pay | Admitting: Licensed Clinical Social Worker

## 2022-10-13 ENCOUNTER — Encounter: Payer: Self-pay | Admitting: Licensed Clinical Social Worker

## 2022-10-13 ENCOUNTER — Other Ambulatory Visit (HOSPITAL_COMMUNITY): Payer: Self-pay

## 2022-10-13 ENCOUNTER — Telehealth: Payer: Self-pay | Admitting: Licensed Clinical Social Worker

## 2022-10-13 DIAGNOSIS — Z1379 Encounter for other screening for genetic and chromosomal anomalies: Secondary | ICD-10-CM

## 2022-10-13 MED ORDER — BIMZELX 160 MG/ML ~~LOC~~ SOAJ: SUBCUTANEOUS | 5 refills | Status: DC

## 2022-10-13 MED ORDER — BIMZELX 160 MG/ML ~~LOC~~ SOAJ
SUBCUTANEOUS | 4 refills | Status: DC
  Filled 2022-10-31: qty 2, 28d supply, fill #0
  Filled 2022-11-20 – 2022-11-25 (×2): qty 2, 28d supply, fill #1

## 2022-10-13 NOTE — Telephone Encounter (Signed)
I contacted Ms. Alberg to discuss her genetic testing results. No pathogenic variants were identified in the 77 genes analyzed. Detailed clinic note to follow.   The test report has been scanned into EPIC and is located under the Molecular Pathology section of the Results Review tab.  A portion of the result report is included below for reference.      Faith Rogue, MS, [facility] Genetic Counselor [email or web address] Phone: [phone]

## 2022-10-13 NOTE — Progress Notes (Signed)
HPI:   Cassandra Munoz was previously seen in the [place] clinic due to a family history of breast cancer and concerns regarding a hereditary predisposition to cancer. Please refer to our prior cancer genetics clinic note for more information regarding our discussion, assessment and recommendations, at the time. Cassandra Munoz's recent genetic test results were disclosed to her, as were recommendations warranted by these results. These results and recommendations are discussed in more detail below.  CANCER HISTORY:  Oncology History   No history exists.    FAMILY HISTORY:  We obtained a detailed, 4-generation family history.  Significant diagnoses are listed below: Family History  Problem Relation Age of Onset   Diabetes Mother    Hypertension Mother    Colon cancer Father 61   Heart disease Father    Hypertension Father    Cancer Father    Prostate cancer Father        dx 33s   Colon cancer Sister    Hypertension Brother    Hyperlipidemia Brother    Diabetes Brother    Bladder Cancer Brother        dx 80s   Heart failure Brother        had pace maker put in   Breast cancer Paternal Aunt        dx 18s   Breast cancer Paternal Aunt        dx 86s   Breast cancer Paternal Aunt    Cancer Cousin        either breast or colon   Cancer Cousin        unk type   Breast cancer Cousin    Cassandra Munoz has 1 sister and 2 brothers. One brother had early stage bladder cancer in his 32s and is living at 64. Her sister has intellectual disability and was diagnosed with colon cancer, early stage, unsure at what age.    Cassandra Munoz's mother passed last year at 79. No known cancers on this side of the family.   Cassandra Munoz's father had colon cancer initially diagnosed at 15, prostate cancer diagnosed in his 19s, and then passed of metastatic colon cancer in his 20s. Patient had 3 paternal aunts and 3 paternal uncles. All three aunts had breast cancer, two diagnosed in their  15s and both passed of the cancer. The other aunt had a double mastectomy and passed of an aneurysm. An uncle possibly had cancer. Another uncle had 3 children with cancer, one had either breast or colon, another had breast, and the other cousin is a female, patient unsure what type of cancer he passed from.    Cassandra Munoz is unaware of previous family history of genetic testing for hereditary cancer risks. There is no reported Ashkenazi Jewish ancestry. There is no known consanguinity.      GENETIC TEST RESULTS:  The [employer] CancerNext-Expanded+RNA Panel found no pathogenic mutations.   The CancerNext-Expanded + RNAinsight gene panel offered by [employer] and includes sequencing and rearrangement analysis for the following 77 genes: IP, ALK, APC*, ATM*, AXIN2, BAP1, BARD1, BLM, BMPR1A, BRCA1*, BRCA2*, BRIP1*, CDC73, CDH1*,CDK4, CDKN1B, CDKN2A, CHEK2*, CTNNA1, DICER1, FANCC, FH, FLCN, GALNT12, KIF1B, LZTR1, MAX, MEN1, MET, MLH1*, MSH2*, MSH3, MSH6*, MUTYH*, NBN, NF1*, NF2, NTHL1, PALB2*, PHOX2B, PMS2*, POT1, PRKAR1A, PTCH1, PTEN*, RAD51C*, RAD51D*,RB1, RECQL, RET, SDHA, SDHAF2, SDHB, SDHC, SDHD, SMAD4, SMARCA4, SMARCB1, SMARCE1, STK11, SUFU, TMEM127, TP53*,TSC1, TSC2, VHL and XRCC2 (sequencing and deletion/duplication); EGFR, EGLN1, HOXB13, KIT, MITF, PDGFRA, POLD1 and POLE (sequencing  only); EPCAM and GREM1 (deletion/duplication only).   The test report has been scanned into EPIC and is located under the Molecular Pathology section of the Results Review tab.  A portion of the result report is included below for reference. Genetic testing reported out on 10/10/2022.      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  At this time, it is unknown if this variant is associated with an increased risk for cancer or if it is benign, but most uncertain variants are reclassified to benign. It should not be used to make medical management decisions.  With time, we suspect the laboratory will determine the significance of this variant, if any. If the laboratory reclassifies this variant, we will attempt to contact Cassandra Munoz to discuss it further.   Even though a pathogenic variant was not identified, possible explanations for the cancer in the family may include: There may be no hereditary risk for cancer in the family. The cancers in Ms. Connaughton and/or her family may be sporadic/familial or due to other genetic and environmental factors. There may be a gene mutation in one of these genes that current testing methods cannot detect but that chance is small. There could be another gene that has not yet been discovered, or that we have not yet tested, that is responsible for the cancer diagnoses in the family.  It is also possible there is a hereditary cause for the cancer in the family that Cassandra Munoz did not inherit.  Therefore, it is important to remain in touch with cancer genetics in the future so that we can continue to offer Cassandra Munoz the most up to date genetic testing.    ADDITIONAL GENETIC TESTING:  We discussed with Ms. Suder that her genetic testing was fairly extensive.  If there are additional relevant genes identified to increase cancer risk that can be analyzed in the future, we would be happy to discuss and coordinate this testing at that time.    CANCER SCREENING RECOMMENDATIONS:  Cassandra Munoz's test result is considered negative (normal).  This means that we have not identified a hereditary cause for her family history of cancer at this time.   An individual's cancer risk and medical management are not determined by genetic test results alone. Overall cancer risk assessment incorporates additional factors, including personal medical history, family history, and any available genetic information that may result in a personalized plan for cancer prevention and surveillance. Therefore, it is recommended she continue to  follow the cancer management and screening guidelines provided by her oncology and primary healthcare provider. Cassandra Munoz is followed by Dr. Yu/Dr. Windell Moment for high risk breast cancer.   RECOMMENDATIONS FOR FAMILY MEMBERS:   Individuals in this family might be at some increased risk of developing cancer, over the general population risk, due to the family history of cancer.  Individuals in the family should notify their providers of the family history of cancer. We recommend women in this family have a yearly mammogram beginning at age 48, or 51 years younger than the earliest onset of cancer, an annual clinical breast exam, and perform monthly breast self-exams.  Family members should have colonoscopies by at age 66, or earlier, as recommended by their providers. Other members of the family may still carry a pathogenic variant in one of these genes that Ms. Calico did not inherit. Based on the family history, we recommend her paternal relatives, especially those who have had  cancer, have genetic counseling and testing. Ms. Christley will let us know if we can be of any assistance in coordinating genetic counseling and/or testing for this family member.   We do not recommend familial testing for the ATM variant of uncertain significance (VUS).  FOLLOW-UP:  Lastly, we discussed with Ms. Franzel that cancer genetics is a rapidly advancing field and it is possible that new genetic tests will be appropriate for her and/or her family members in the future. We encouraged her to remain in contact with cancer genetics on an annual basis so we can update her personal and family histories and let her know of advances in cancer genetics that may benefit this family.   Our contact number was provided. Ms. Mccutcheon's questions were answered to her satisfaction, and she knows she is welcome to call us at anytime with additional questions or concerns.    Faith Rogue, MS, [facility] Genetic  Counselor [email or web address] Phone: [phone]

## 2022-10-14 ENCOUNTER — Other Ambulatory Visit (HOSPITAL_COMMUNITY): Payer: Self-pay

## 2022-10-16 ENCOUNTER — Other Ambulatory Visit (HOSPITAL_COMMUNITY): Payer: Self-pay

## 2022-10-17 ENCOUNTER — Other Ambulatory Visit (HOSPITAL_COMMUNITY): Payer: Self-pay

## 2022-10-21 ENCOUNTER — Ambulatory Visit: Payer: 59 | Admitting: Family Medicine

## 2022-10-21 ENCOUNTER — Other Ambulatory Visit (HOSPITAL_COMMUNITY): Payer: Self-pay

## 2022-10-22 ENCOUNTER — Other Ambulatory Visit (HOSPITAL_COMMUNITY): Payer: Self-pay

## 2022-10-24 ENCOUNTER — Ambulatory Visit: Payer: 59 | Admitting: Family Medicine

## 2022-10-29 ENCOUNTER — Other Ambulatory Visit (HOSPITAL_COMMUNITY): Payer: Self-pay

## 2022-10-31 ENCOUNTER — Other Ambulatory Visit (HOSPITAL_COMMUNITY): Payer: Self-pay

## 2022-11-05 ENCOUNTER — Other Ambulatory Visit: Payer: Self-pay

## 2022-11-07 ENCOUNTER — Encounter: Payer: Self-pay | Admitting: Nurse Practitioner

## 2022-11-20 ENCOUNTER — Other Ambulatory Visit (HOSPITAL_COMMUNITY): Payer: Self-pay

## 2022-11-24 ENCOUNTER — Other Ambulatory Visit (HOSPITAL_COMMUNITY): Payer: Self-pay

## 2022-11-24 ENCOUNTER — Other Ambulatory Visit: Payer: Self-pay

## 2022-11-25 ENCOUNTER — Other Ambulatory Visit: Payer: Self-pay

## 2022-11-25 ENCOUNTER — Other Ambulatory Visit (HOSPITAL_COMMUNITY): Payer: Self-pay

## 2022-11-25 NOTE — Progress Notes (Unsigned)
New patient visit   Patient: Cassandra Munoz   DOB: 1961-01-31   62 y.o. Female  MRN: [id number] Visit Date: 11/26/2022  Today's healthcare provider: Gwyneth Sprout, FNP   No chief complaint on file.  Subjective    Cassandra Munoz is a 62 y.o. female who presents today as a new patient to establish care.  HPI  ***  Past Medical History:  Diagnosis Date   Atypical mole 02/08/2020   Left forearm. Melanocytic proliferation of uncertain malignant potential, base involved.   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   Psoriasis    Sciatic pain 2015   Vitamin D deficiency    Past Surgical History:  Procedure Laterality Date   ABDOMINAL HYSTERECTOMY N/A 05/07/2015   Procedure: HYSTERECTOMY ABDOMINAL;  Surgeon: Brayton Mars, MD;  Location: [facility] ORS;  Service: Gynecology;  Laterality: N/A;   ANKLE ARTHROSCOPY Right    BREAST BIOPSY Right 07/14/2022   US biopsy/ ribbon clip/ path pending   BREAST BIOPSY WITH RADIO FREQUENCY LOCALIZER Right 08/20/2022   Procedure: BREAST BIOPSY WITH RADIO FREQUENCY LOCALIZER;  Surgeon: Herbert Pun, MD;  Location: [facility] ORS;  Service: General;  Laterality: Right;   COLONOSCOPY WITH PROPOFOL N/A 04/03/2016   Procedure: COLONOSCOPY WITH PROPOFOL;  Surgeon: Manya Silvas, MD;  Location: [facility] ENDOSCOPY;  Service: Endoscopy;  Laterality: N/A;   ROBOTIC ASSISTED LAPAROSCOPIC VAGINAL HYSTERECTOMY WITH FIBROID REMOVAL     Fibroid removal   TONSILLECTOMY AND ADENOIDECTOMY     as a chaild   Family Status  Relation Name Status   Mother  Deceased   Father  Deceased   Sister  Alive   Brother  Alive   Brother  Alive   Pat Aunt  Alive   Pat Aunt  Alive   Pat Monument   Cousin paternal Deceased   Cousin paternal Deceased   Cousin paternal 47   Family History  Problem Relation Age of Onset   Diabetes Mother     Hypertension Mother    Colon cancer Father 71   Heart disease Father    Hypertension Father    Cancer Father    Prostate cancer Father        dx 62s   Colon cancer Sister    Hypertension Brother    Hyperlipidemia Brother    Diabetes Brother    Bladder Cancer Brother        dx 66s   Heart failure Brother        had pace maker put in   Breast cancer Paternal Aunt        dx 64s   Breast cancer Paternal Aunt        dx 76s   Breast cancer Paternal Aunt    Cancer Cousin        either breast or colon   Cancer Cousin        unk type   Breast cancer Cousin    Social History   Socioeconomic History   Marital status: Married    Spouse name: Not on file   Number of children: Not on file   Years of education: Not on file   Highest education level: Not on file  Occupational History   Not on file  Tobacco Use   Smoking status: Never   Smokeless tobacco: Never  Vaping Use   Vaping Use: Never  used  Substance and Sexual Activity   Alcohol use: No    Alcohol/week: 0.0 standard drinks of alcohol    Comment: maybe 1 drink/month - socially   Drug use: No   Sexual activity: Yes    Birth control/protection: None  Other Topics Concern   Not on file  Social History Narrative   [facility]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   Occasional alcohol use   1 miscarriage   Last PAP: 2018 (Normal [place])   Mammogram screening: 2013 (Normal/[place])      Social Determinants of Health   Financial Resource Strain: Not on file  Food Insecurity: Not on file  Transportation Needs: Not on file  Physical Activity: Not on file  Stress: Not on file  Social Connections: Not on file   Outpatient Medications Prior to Visit  Medication Sig   azelastine (ASTELIN) 0.1 % nasal spray One to two spray each nostril  q 12 prn  drainage   Bimekizumab-bkzx (BIMZELX) 160 MG/ML SOAJ Inject '320mg'$  SQ every 8 weeks ( maintenance dose )   Bimekizumab-bkzx (BIMZELX) 160 MG/ML SOAJ Inject '320mg'$  SQ every 4 weeks at weeks 0, 4, 8, 12, and  16   dapagliflozin propanediol (FARXIGA) 10 MG TABS tablet Take 1 tablet (10 mg total) by mouth daily every morning   ezetimibe (ZETIA) 10 MG tablet Take 1 tablet (10 mg total) by mouth daily.   glucose blood test strip USE TO CHECK BLOOD SUGAR 2 TIMES A DAY. Freestyle lite   Lancets (FREESTYLE) lancets Use to check blood sugar 2 times a day.   Multiple Vitamin (MULTI-VITAMINS) TABS Take by mouth.   nystatin-triamcinolone (MYCOLOG II) cream Apply 1 application topically 2 (two) times daily. Use for 10-14 days   ondansetron (ZOFRAN) 8 MG tablet Take 1 tablet (8 mg total) by mouth every 8 (eight) hours as needed for nausea or vomiting.   pramoxine (SARNA SENSITIVE) 1 % LOTN Apply to affected areas once or twice daily as needed   Risankizumab-rzaa (SKYRIZI) 150 MG/ML SOSY Inject 150 mg SQ every 12 weeks   Semaglutide, 2 MG/DOSE, (OZEMPIC, 2 MG/DOSE,) 8 MG/3ML SOPN Inject 2 mg into the skin once a week.   triamcinolone (KENALOG) 0.147 MG/GM topical spray Apply topically once or twice daily until clear then as needed   vitamin B-12 (CYANOCOBALAMIN) 100 MCG tablet Take 100 mcg by mouth daily.   No facility-administered medications prior to visit.   Allergies  Allergen Reactions   Clobetasol Rash    Ointment used for psorasis    Immunization History  Administered Date(s) Administered   Influenza,inj,Quad PF,6+ Mos 07/26/2019   Influenza-Unspecified 07/26/2019, 08/05/2021, 07/28/2022    Health Maintenance  Topic Date Due   COVID-19 Vaccine (1) Never done   FOOT EXAM  Never done   DTaP/Tdap/Td (1 - Tdap) Never done   Zoster Vaccines- Shingrix (1 of 2) Never done   PAP SMEAR-Modifier  04/12/2021   Diabetic kidney evaluation - Urine ACR  11/05/2022   HEMOGLOBIN A1C  01/31/2023   OPHTHALMOLOGY EXAM  05/02/2023   Diabetic kidney evaluation - eGFR measurement  08/02/2023   MAMMOGRAM  06/25/2024   COLONOSCOPY (Pts 45-88yr Insurance coverage will need to be confirmed)  04/03/2026    INFLUENZA VACCINE  Completed   Hepatitis C Screening  Completed   HIV Screening  Completed   HPV VACCINES  Aged Out    Patient Care Team: HJon Billings NP as PCP - General (Nurse Practitioner) GPhilemon Kingdom MD as Consulting Physician (Endocrinology) SJasper  Melody N, CNM (Inactive) as Midwife (Obstetrics and Gynecology)  Review of Systems  {Labs  Heme  Chem  Endocrine  Serology  Results Review (optional):23779}   Objective    LMP 05/01/2013  {Show previous vital signs (optional):23777}  Physical Exam ***  Depression Screen    07/22/2022    1:05 PM 04/15/2022    1:12 PM 01/28/2022    8:36 AM 12/17/2021    9:36 AM  PHQ 2/9 Scores  PHQ - 2 Score 0 0 0 0  PHQ- 9 Score 0 0 0 0   No results found for any visits on 11/26/22.  Assessment & Plan     ***  No follow-ups on file.     {provider attestation***:1}   Gwyneth Sprout, [place] [phone] (phone) [phone] (fax)  [place]

## 2022-11-26 ENCOUNTER — Ambulatory Visit: Payer: 59 | Admitting: Family Medicine

## 2022-11-26 ENCOUNTER — Other Ambulatory Visit: Payer: Self-pay

## 2022-11-26 ENCOUNTER — Encounter: Payer: Self-pay | Admitting: Family Medicine

## 2022-11-26 VITALS — BP 128/63 | HR 76 | Temp 98.3°F | Wt 253.2 lb

## 2022-11-26 DIAGNOSIS — E559 Vitamin D deficiency, unspecified: Secondary | ICD-10-CM

## 2022-11-26 DIAGNOSIS — I152 Hypertension secondary to endocrine disorders: Secondary | ICD-10-CM | POA: Diagnosis not present

## 2022-11-26 DIAGNOSIS — E785 Hyperlipidemia, unspecified: Secondary | ICD-10-CM | POA: Diagnosis not present

## 2022-11-26 DIAGNOSIS — E1159 Type 2 diabetes mellitus with other circulatory complications: Secondary | ICD-10-CM | POA: Diagnosis not present

## 2022-11-26 DIAGNOSIS — Z1211 Encounter for screening for malignant neoplasm of colon: Secondary | ICD-10-CM | POA: Diagnosis not present

## 2022-11-26 DIAGNOSIS — Z5320 Procedure and treatment not carried out because of patient's decision for unspecified reasons: Secondary | ICD-10-CM | POA: Insufficient documentation

## 2022-11-26 DIAGNOSIS — E1169 Type 2 diabetes mellitus with other specified complication: Secondary | ICD-10-CM

## 2022-11-26 DIAGNOSIS — N6099 Unspecified benign mammary dysplasia of unspecified breast: Secondary | ICD-10-CM

## 2022-11-26 DIAGNOSIS — E1165 Type 2 diabetes mellitus with hyperglycemia: Secondary | ICD-10-CM | POA: Diagnosis not present

## 2022-11-26 DIAGNOSIS — Z794 Long term (current) use of insulin: Secondary | ICD-10-CM | POA: Diagnosis not present

## 2022-11-26 HISTORY — DX: Type 2 diabetes mellitus with other circulatory complications: E11.59

## 2022-11-26 MED FILL — Semaglutide Soln Pen-inj 2 MG/DOSE (8 MG/3ML): SUBCUTANEOUS | 28 days supply | Qty: 3 | Fill #3 | Status: CN

## 2022-11-26 MED FILL — Semaglutide Soln Pen-inj 2 MG/DOSE (8 MG/3ML): SUBCUTANEOUS | 28 days supply | Qty: 3 | Fill #3 | Status: AC

## 2022-11-26 NOTE — Assessment & Plan Note (Signed)
Chronic, previously stopped supplementation Last labs 17

## 2022-11-26 NOTE — Assessment & Plan Note (Signed)
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  Case was discussed on breast tumor board and pathologist confirms a very small area of ADH.  We discussed about the option of active surveillance vs chemo prevention with aromatase inhibitor.  Patient elected to proceed with surveillance only.  Recommend life time annual screening mammogram and physical examination.  She will follow up with Dr. Peyton Najjar for surveillance.   No further concerns today

## 2022-11-26 NOTE — Assessment & Plan Note (Addendum)
Chronic, unchanged with use of ozempic 2 mg Body mass index is 42.13 kg/m. Continue to recommend balanced, lower carb meals. Smaller meal size, adding snacks. Choosing water as drink of choice and increasing purposeful exercise.

## 2022-11-26 NOTE — Assessment & Plan Note (Signed)
Chronic, previously elevated Patient declines use of statin LDL goal of 55-70 with DM Currently on Zetia 10; repeat LP

## 2022-11-26 NOTE — Assessment & Plan Note (Signed)
Chronic, improved Previously on long acting insulin; currently on Farxiga 10 with use of Ozempic 2 mg. Recently increased from 1 to 2 mg of Ozempic by previous provider. Discussed next steps if needed to assist with lifestyle management ex change to mounjaro if indicated  Foot exam completed; UA completed today

## 2022-11-26 NOTE — Assessment & Plan Note (Signed)
Chronic, borderline Continue to emphasize lifestyle management to assist Goal <130/<80 discussed for best prevention of ASCVD risk Not currently on medications

## 2022-11-27 ENCOUNTER — Other Ambulatory Visit: Payer: Self-pay | Admitting: Family Medicine

## 2022-11-27 ENCOUNTER — Other Ambulatory Visit: Payer: Self-pay

## 2022-11-27 MED ORDER — BEMPEDOIC ACID 180 MG PO TABS
180.0000 mg | ORAL_TABLET | Freq: Every day | ORAL | 1 refills | Status: DC
  Filled 2022-11-27 – 2022-12-18 (×3): qty 30, 30d supply, fill #0

## 2022-11-27 NOTE — Progress Notes (Signed)
Cholesterol has increased from previous sampling. I continue to recommend diet low in saturated fat and regular exercise - 30 min at least 5 times per week. Could consider possible addition of another non-statin medication to assist lifestyle changes.  A1c has also increased; goal is 7%. Continue to recommend balanced, lower carb meals. Smaller meal size, adding snacks. Choosing water as drink of choice and increasing purposeful exercise. Can try change to [facility] as previously discussed.  Stable elevation in blood cells. Continue to monitor.   Vit D remains borderline low. Continue to recommend 2000-5000 IU/daily OTC for bone health.  Urine pending.

## 2022-11-28 LAB — LIPID PANEL
Chol/HDL Ratio: 4.3 ratio (ref 0.0–4.4)
Cholesterol, Total: 210 mg/dL — ABNORMAL HIGH (ref 100–199)
HDL: 49 mg/dL (ref >39)
LDL Chol Calc (NIH): 135 mg/dL — ABNORMAL HIGH (ref 0–99)
Triglycerides: 147 mg/dL (ref 0–149)
VLDL Cholesterol Cal: 26 mg/dL (ref 5–40)

## 2022-11-28 LAB — BASIC METABOLIC PANEL
BUN/Creatinine Ratio: 20 (ref 12–28)
BUN: 15 mg/dL (ref 8–27)
CO2: 22 mmol/L (ref 20–29)
Calcium: 9.5 mg/dL (ref 8.7–10.3)
Chloride: 101 mmol/L (ref 96–106)
Creatinine, Ser: 0.76 mg/dL (ref 0.57–1.00)
Glucose: 122 mg/dL — ABNORMAL HIGH (ref 70–99)
Potassium: 4.7 mmol/L (ref 3.5–5.2)
Sodium: 139 mmol/L (ref 134–144)
eGFR: 89 mL/min/{1.73_m2} (ref >59)

## 2022-11-28 LAB — CBC
Hematocrit: 47.7 % — ABNORMAL HIGH (ref 34.0–46.6)
Hemoglobin: 16 g/dL — ABNORMAL HIGH (ref 11.1–15.9)
MCH: 28.8 pg (ref 26.6–33.0)
MCHC: 33.5 g/dL (ref 31.5–35.7)
MCV: 86 fL (ref 79–97)
Platelets: 255 10*3/uL (ref 150–450)
RBC: 5.56 x10E6/uL — ABNORMAL HIGH (ref 3.77–5.28)
RDW: 13.3 % (ref 11.7–15.4)
WBC: 6.2 10*3/uL (ref 3.4–10.8)

## 2022-11-28 LAB — MICROALBUMIN / CREATININE URINE RATIO
Creatinine, Urine: 109.7 mg/dL
Microalb/Creat Ratio: 13 mg/g creat (ref 0–29)
Microalbumin, Urine: 14.7 ug/mL

## 2022-11-28 LAB — VITAMIN D 25 HYDROXY (VIT D DEFICIENCY, FRACTURES): Vit D, 25-Hydroxy: 32.6 ng/mL (ref 30.0–100.0)

## 2022-11-28 LAB — HEMOGLOBIN A1C
Est. average glucose Bld gHb Est-mCnc: 160 mg/dL
Hgb A1c MFr Bld: 7.2 % — ABNORMAL HIGH (ref 4.8–5.6)

## 2022-12-01 ENCOUNTER — Other Ambulatory Visit: Payer: Self-pay | Admitting: Family Medicine

## 2022-12-01 NOTE — Progress Notes (Signed)
Urine micro stable. Improved from previously. Continue to recommend balanced, lower carb meals. Smaller meal size, adding snacks. Choosing water as drink of choice and increasing purposeful exercise.

## 2022-12-03 ENCOUNTER — Telehealth: Payer: Self-pay

## 2022-12-03 NOTE — Telephone Encounter (Signed)
-----   Message from Gwyneth Sprout, FNP sent at 12/01/2022  4:54 PM EST ----- Regarding: FW: HLD treatment Please let patient know our hands are tied with current options for secondary treatment without "failing a statin"  ----- Message ----- From: Germaine Pomfret, [facility] Sent: 12/01/2022   2:08 PM EST To: Gwyneth Sprout, FNP Subject: RE: HLD treatment                              Unfortunately if patient is uncontrolled on Zetia then she would likely need to fail a statin before she would qualify for bempedoic acid or a PCKS9 inhibitor. You could try fenofibrate but it may not be worth it given the minimal impact and lack of CV reduction data. I would continue to emphasize the importance of statins for lowering CV risk and see if she changes her mind in the future.   Thanks, Junius Argyle, PharmD, Para [place], CPP  Clinical Pharmacist Practitioner  [facility] [phone]  ----- Message ----- From: Gwyneth Sprout, FNP Sent: 12/01/2022  10:50 AM EST To: Germaine Pomfret, RPH Subject: HLD treatment                                  GM,  I wanted to place patient on bempedoic acid; however, it is a "step level" drug. She is refusing statin. She is on zetia.  Thoughts on next steps?  Thanks

## 2022-12-03 NOTE — Telephone Encounter (Signed)
-----   Message from Gwyneth Sprout, FNP sent at 12/01/2022  4:54 PM EST ----- Regarding: FW: HLD treatment Please let patient know our hands are tied with current options for secondary treatment without "failing a statin"  ----- Message ----- From: Germaine Pomfret, [facility] Sent: 12/01/2022   2:08 PM EST To: Gwyneth Sprout, FNP Subject: RE: HLD treatment                              Unfortunately if patient is uncontrolled on Zetia then she would likely need to fail a statin before she would qualify for bempedoic acid or a PCKS9 inhibitor. You could try fenofibrate but it may not be worth it given the minimal impact and lack of CV reduction data. I would continue to emphasize the importance of statins for lowering CV risk and see if she changes her mind in the future.   Thanks, Junius Argyle, PharmD, Para [place], CPP  Clinical Pharmacist Practitioner  [facility] [phone]  ----- Message ----- From: Gwyneth Sprout, FNP Sent: 12/01/2022  10:50 AM EST To: Germaine Pomfret, RPH Subject: HLD treatment                                  GM,  I wanted to place patient on bempedoic acid; however, it is a "step level" drug. She is refusing statin. She is on zetia.  Thoughts on next steps?  Thanks

## 2022-12-03 NOTE — Telephone Encounter (Signed)
Attempted to reach patient, LMTCB. [place] for [employer] to advise

## 2022-12-11 ENCOUNTER — Other Ambulatory Visit (HOSPITAL_COMMUNITY): Payer: Self-pay

## 2022-12-12 ENCOUNTER — Other Ambulatory Visit: Payer: Self-pay

## 2022-12-15 ENCOUNTER — Other Ambulatory Visit (HOSPITAL_COMMUNITY): Payer: Self-pay

## 2022-12-16 ENCOUNTER — Other Ambulatory Visit (HOSPITAL_COMMUNITY): Payer: Self-pay

## 2022-12-16 ENCOUNTER — Other Ambulatory Visit: Payer: Self-pay

## 2022-12-16 ENCOUNTER — Telehealth: Payer: Self-pay | Admitting: Pharmacist

## 2022-12-16 ENCOUNTER — Ambulatory Visit: Payer: Self-pay | Attending: Family Medicine | Admitting: Pharmacist

## 2022-12-16 DIAGNOSIS — Z79899 Other long term (current) drug therapy: Secondary | ICD-10-CM

## 2022-12-16 MED ORDER — BIMZELX 160 MG/ML ~~LOC~~ SOAJ
SUBCUTANEOUS | 3 refills | Status: DC
  Filled 2022-12-16: qty 2, fill #0
  Filled 2022-12-24: qty 2, 28d supply, fill #0
  Filled 2022-12-24: qty 2, fill #0
  Filled 2023-01-20: qty 2, 28d supply, fill #1
  Filled 2023-02-18: qty 2, 28d supply, fill #2
  Filled 2023-04-10: qty 2, 28d supply, fill #3

## 2022-12-16 MED ORDER — BIMZELX 160 MG/ML ~~LOC~~ SOAJ
SUBCUTANEOUS | 5 refills | Status: DC
  Filled 2022-12-16: qty 2, fill #0

## 2022-12-16 NOTE — Telephone Encounter (Signed)
Called patient to schedule an appointment for the [employer] [facility]. I was unable to reach the patient so I left a HIPAA-compliant message requesting that the patient return my call.   Luke Van Ausdall, PharmD, BCACP, CPP Clinical Pharmacist [facility] [phone]  

## 2022-12-16 NOTE — Progress Notes (Signed)
   S: Patient presents today for review of their specialty medication.   Patient is currently taking Bimzelx for psoriasis. Patient is managed by [facility] for this.   Dosing: 320 mg subcutaneous at weeks 0, 4, 8, 12, and 16 then once every 9 weeks thereafter.   Adherence: confirms. Has taken 2 injections so far.   Efficacy: reports good results. Orson Ape was generally effective, but there was a persistent scalp lesion. This has gone away since starting the Bimzelx.   TB testing: completed   Monitoring:  -LFTs:  -S/sx of infection: none -GI distress: none   Current adverse effects: -Acne/folliculitis: none -Injection site reaction: none -HA: none -Fatigue: none  -Cough: endorses a dry cough that is similar to what she has experienced in the past d/t post nasal drip.   O:     Lab Results  Component Value Date   WBC 6.2 11/26/2022   HGB 16.0 (H) 11/26/2022   HCT 47.7 (H) 11/26/2022   MCV 86 11/26/2022   PLT 255 11/26/2022      Chemistry      Component Value Date/Time   NA 139 11/26/2022 0949   K 4.7 11/26/2022 0949   CL 101 11/26/2022 0949   CO2 22 11/26/2022 0949   BUN 15 11/26/2022 0949   CREATININE 0.76 11/26/2022 0949   CREATININE 0.76 11/23/2017 1049      Component Value Date/Time   CALCIUM 9.5 11/26/2022 0949   ALKPHOS 45 08/01/2022 0827   AST 37 08/01/2022 0827   ALT 62 (H) 08/01/2022 0827   BILITOT 0.5 08/01/2022 0827   BILITOT 0.3 04/10/2022 1005       A/P: 1. Medication review: patient currently on Bimzelx for psoriasis. She is tolerating it well with good efficacy. Reviewed the medication with her. Bimekizumab-bkzx is a humanized immunoglobulin IgG1/kappa monoclonal antibody that selectively binds to IL-17A, IL-57F, and interleukin 17-AF cytokines, and inhibits their interaction with the IL-17 receptor complex. Common side effects include: injection site reactions, GI upset, cough, HA, and fatigue. Less common side effects include LFT elevation,  IBD, and psychiatric changes. Patients should be monitored for S/sx of infection, GI distress. LFTs should be monitored periodically. Patient has no questions at this time.   Administration/storage: Before injecting, remove the carton from the refrigerator and allow to reach room temperature (30 to 45 minutes). Do not remove the prefilled syringes or autoinjectors from the carton until ready for use to protect from light; do not shake.Prefilled syringes or autoinjectors may be stored at room temperature up to 77 degrees F (25 degrees C) for up to 30 days in the original carton. Inject each separate 160 mg single-dose prefilled syringe or autoinjector subQ at a different anatomic location (such as thighs, abdomen or back of upper arm). Do not inject within 2 inches (5 cm) of the navel or into areas where the skin is tender, bruised, red, hard, thick, scaly, or affected by psoriasis.   No recommendation for any changes at this time.

## 2022-12-18 ENCOUNTER — Other Ambulatory Visit: Payer: Self-pay | Admitting: Family Medicine

## 2022-12-18 ENCOUNTER — Other Ambulatory Visit: Payer: Self-pay

## 2022-12-18 ENCOUNTER — Encounter: Payer: Self-pay | Admitting: Family Medicine

## 2022-12-18 NOTE — Progress Notes (Unsigned)
.  bfpt

## 2022-12-24 ENCOUNTER — Other Ambulatory Visit (HOSPITAL_COMMUNITY): Payer: Self-pay

## 2022-12-24 ENCOUNTER — Other Ambulatory Visit: Payer: Self-pay

## 2022-12-25 ENCOUNTER — Other Ambulatory Visit (HOSPITAL_COMMUNITY): Payer: Self-pay

## 2022-12-26 ENCOUNTER — Other Ambulatory Visit (HOSPITAL_COMMUNITY): Payer: Self-pay

## 2022-12-29 ENCOUNTER — Other Ambulatory Visit (HOSPITAL_COMMUNITY): Payer: Self-pay

## 2022-12-30 ENCOUNTER — Other Ambulatory Visit (HOSPITAL_COMMUNITY): Payer: Self-pay

## 2022-12-31 ENCOUNTER — Other Ambulatory Visit (HOSPITAL_COMMUNITY): Payer: Self-pay

## 2023-01-01 ENCOUNTER — Other Ambulatory Visit: Payer: Self-pay

## 2023-01-01 ENCOUNTER — Other Ambulatory Visit (HOSPITAL_COMMUNITY): Payer: Self-pay

## 2023-01-02 ENCOUNTER — Other Ambulatory Visit: Payer: Self-pay

## 2023-01-02 ENCOUNTER — Other Ambulatory Visit (HOSPITAL_COMMUNITY): Payer: Self-pay

## 2023-01-06 ENCOUNTER — Other Ambulatory Visit (HOSPITAL_COMMUNITY): Payer: Self-pay

## 2023-01-13 ENCOUNTER — Other Ambulatory Visit: Payer: Self-pay

## 2023-01-13 MED ORDER — OZEMPIC (2 MG/DOSE) 8 MG/3ML ~~LOC~~ SOPN
2.0000 mg | PEN_INJECTOR | SUBCUTANEOUS | 3 refills | Status: DC
  Filled 2023-01-13: qty 3, 28d supply, fill #0

## 2023-01-13 NOTE — Telephone Encounter (Signed)
Cassandra Munoz  to [employer] Admin (supporting Gwyneth Sprout, FNP)         01/12/23  9:02 AM Hello [name].  I need a refill on my Ozempic 2mg /dose.  That is what I have been on.  The last time this was prescribed was by my previous provider.  When I came to you, we talked about possibly going up on dosage but it would change the medication to something else.  I'm fine staying on Ozempic or changing.  Whatever you think is best.  I just need a new prescription.  Thank you.

## 2023-01-15 ENCOUNTER — Other Ambulatory Visit (HOSPITAL_COMMUNITY): Payer: Self-pay

## 2023-01-16 ENCOUNTER — Other Ambulatory Visit (HOSPITAL_COMMUNITY): Payer: Self-pay

## 2023-01-20 ENCOUNTER — Other Ambulatory Visit (HOSPITAL_COMMUNITY): Payer: Self-pay

## 2023-01-22 ENCOUNTER — Other Ambulatory Visit: Payer: Self-pay

## 2023-01-23 ENCOUNTER — Other Ambulatory Visit (HOSPITAL_COMMUNITY): Payer: Self-pay

## 2023-01-23 ENCOUNTER — Other Ambulatory Visit: Payer: Self-pay

## 2023-01-30 ENCOUNTER — Ambulatory Visit: Payer: 59 | Admitting: Family Medicine

## 2023-01-30 ENCOUNTER — Other Ambulatory Visit: Payer: Self-pay

## 2023-02-03 NOTE — Progress Notes (Unsigned)
I,[name],acting as a scribe for [name], FNP.,have documented all relevant documentation on the behalf of [name], FNP,as directed by  [name], FNP while in the presence of [name], FNP.  Established patient visit  Patient: Cassandra Munoz   DOB: [birth year]   62 y.o. Female  MRN: [id number] Visit Date: 02/04/2023  Today's healthcare provider: [name], FNP  Re Introduced to nurse practitioner role and practice setting.  All questions answered.  Discussed provider/patient relationship and expectations.  Chief Complaint  Patient presents with   Follow-up   Subjective    HPI  Follow up for Hypertension associated with diabetes [facility])  The patient was last seen for this 3 months ago. Changes made at last visit include lifestyle changes.  She reports good compliance with treatment. She feels that condition is Improved. She is not having side effects.  -----------------------------------------------------------------------------------------  Patient is aware that she needs a PAP; plans to establish with Dr Dalbert Garnet later this Fall.  Medications: Outpatient Medications Prior to Visit  Medication Sig   azelastine (ASTELIN) 0.1 % nasal spray One to two spray each nostril  q 12 prn  drainage   Bempedoic Acid 180 MG TABS Take 1 tablet (180 mg total) by mouth daily. Take in addition to Zetia. (Patient not taking: Reported on 02/04/2023)   Bimekizumab-bkzx (BIMZELX) 160 MG/ML SOAJ Inject 320mg  SQ every 8 weeks ( maintenance dose )   dapagliflozin propanediol (FARXIGA) 10 MG TABS tablet Take 1 tablet (10 mg total) by mouth daily every morning   ezetimibe (ZETIA) 10 MG tablet Take 1 tablet (10 mg total) by mouth daily.   Lancets (FREESTYLE) lancets Use to check blood sugar 2 times a day.   nystatin-triamcinolone (MYCOLOG II) cream Apply 1 application topically 2 (two) times daily. Use for 10-14 days   ondansetron (ZOFRAN) 8 MG tablet Take 1 tablet (8 mg  total) by mouth every 8 (eight) hours as needed for nausea or vomiting.   OZEMPIC, 2 MG/DOSE, 8 MG/3ML SOPN Inject 2 mg into the skin once a week.   pramoxine (SARNA SENSITIVE) 1 % LOTN Apply to affected areas once or twice daily as needed   triamcinolone (KENALOG) 0.147 MG/GM topical spray Apply topically once or twice daily until clear then as needed   vitamin B-12 (CYANOCOBALAMIN) 100 MCG tablet Take 100 mcg by mouth daily.   Bimekizumab-bkzx (BIMZELX) 160 MG/ML SOAJ Inject 320mg  SQ every 4 weeks at weeks 0, 4, 8, 12, and 16   Multiple Vitamin (MULTI-VITAMINS) TABS Take by mouth. (Patient not taking: Reported on 02/04/2023)   No facility-administered medications prior to visit.    Review of Systems    Objective    BP 132/67 (BP Location: Left Arm, Patient Position: Sitting, Cuff Size: Large)   Pulse 78   Temp 98.5 F (36.9 C) (Oral)   Ht 5\' 5"  (1.651 m)   Wt 253 lb (114.8 kg)   LMP 05/01/2013   SpO2 98%   BMI 42.10 kg/m   Physical Exam Vitals and nursing note reviewed.  Constitutional:      General: She is not in acute distress.    Appearance: Normal appearance. She is obese. She is not ill-appearing, toxic-appearing or diaphoretic.  HENT:     Head: Normocephalic and atraumatic.  Cardiovascular:     Rate and Rhythm: Normal rate and regular rhythm.     Pulses: Normal pulses.     Heart sounds: Normal heart sounds.  No murmur heard.    No friction rub. No gallop.  Pulmonary:     Effort: Pulmonary effort is normal. No respiratory distress.     Breath sounds: Normal breath sounds. No stridor. No wheezing, rhonchi or rales.  Chest:     Chest wall: No tenderness.  Musculoskeletal:        General: No swelling, tenderness, deformity or signs of injury. Normal range of motion.     Right lower leg: No edema.     Left lower leg: No edema.  Skin:    General: Skin is warm and dry.     Capillary Refill: Capillary refill takes less than 2 seconds.     Coloration: Skin is not  jaundiced or pale.     Findings: No bruising, erythema, lesion or rash.  Neurological:     General: No focal deficit present.     Mental Status: She is alert and oriented to person, place, and time. Mental status is at baseline.     Cranial Nerves: No cranial nerve deficit.     Sensory: No sensory deficit.     Motor: No weakness.     Coordination: Coordination normal.  Psychiatric:        Mood and Affect: Mood normal.        Behavior: Behavior normal.        Thought Content: Thought content normal.        Judgment: Judgment normal.     No results found for any visits on 02/04/23.  Assessment & Plan     Problem List Items Addressed This Visit       Cardiovascular and Mediastinum   Hypertension associated with diabetes    Chronic, borderline Defer medication changes at this time Continue to emphasize salt reduced diet and exercise to help Goal of <129/<79 Previously started on farxiga 10 mg alone; discussed home/work checks to address possible addition of norvasc 5 mg to assist       Relevant Medications   tirzepatide (MOUNJARO) 10 MG/0.5ML Pen   Other Relevant Orders   CBC with Differential/Platelet   Comprehensive Metabolic Panel (CMET)     Endocrine   Hyperlipidemia associated with type 2 diabetes mellitus    Chronic, previously elevated Wishes to decline start of statin Was started on bempedoic acid 180 in addition to zetia 10 mg; however, patient currently only on zetia Repeat LDL Goal of <70 recommend diet low in saturated fat and regular exercise - 30 min at least 5 times per week       Relevant Medications   tirzepatide (MOUNJARO) 10 MG/0.5ML Pen   Other Relevant Orders   Lipid panel   Type 2 diabetes mellitus with hyperglycemia, with long-term current use of insulin    Chronic, with associated HTN and HLD as well as obesity A1c goal of <7% Previous NFG of 120s. Goal <100.  Continue to recommend balanced, lower carb meals. Smaller meal size, adding  snacks. Choosing water as drink of choice and increasing purposeful exercise.       Relevant Medications   tirzepatide (MOUNJARO) 10 MG/0.5ML Pen   Other Relevant Orders   Hemoglobin A1c     Other   Morbid obesity - Primary    Chronic, stable Will change to mounjaro to assist with weight mgmt given max dose of ozempic Body mass index is 42.1 kg/m. Discussed importance of healthy weight management Discussed diet and exercise       Relevant Medications   tirzepatide (MOUNJARO) 10 MG/0.5ML  Pen   Other Relevant Orders   Hemoglobin A1c   CBC with Differential/Platelet   Comprehensive Metabolic Panel (CMET)   Lipid panel   Return in about 3 months (around 05/06/2023) for chonic disease management.     Leilani MerlI, Elise T Payne, FNP, have reviewed all documentation for this visit. The documentation on 02/04/23 for the exam, diagnosis, procedures, and orders are all accurate and complete.  Jacky KindleElise T Payne, FNP  [facility] [phone] (phone) [phone] (fax)  [facility]

## 2023-02-04 ENCOUNTER — Other Ambulatory Visit: Payer: Self-pay

## 2023-02-04 ENCOUNTER — Ambulatory Visit (INDEPENDENT_AMBULATORY_CARE_PROVIDER_SITE_OTHER): Payer: 59 | Admitting: Family Medicine

## 2023-02-04 ENCOUNTER — Encounter: Payer: Self-pay | Admitting: Family Medicine

## 2023-02-04 DIAGNOSIS — E1169 Type 2 diabetes mellitus with other specified complication: Secondary | ICD-10-CM | POA: Diagnosis not present

## 2023-02-04 DIAGNOSIS — E1165 Type 2 diabetes mellitus with hyperglycemia: Secondary | ICD-10-CM | POA: Diagnosis not present

## 2023-02-04 DIAGNOSIS — E1159 Type 2 diabetes mellitus with other circulatory complications: Secondary | ICD-10-CM

## 2023-02-04 DIAGNOSIS — Z794 Long term (current) use of insulin: Secondary | ICD-10-CM | POA: Diagnosis not present

## 2023-02-04 DIAGNOSIS — I152 Hypertension secondary to endocrine disorders: Secondary | ICD-10-CM | POA: Diagnosis not present

## 2023-02-04 DIAGNOSIS — E785 Hyperlipidemia, unspecified: Secondary | ICD-10-CM | POA: Diagnosis not present

## 2023-02-04 MED ORDER — TIRZEPATIDE 10 MG/0.5ML ~~LOC~~ SOAJ
10.0000 mg | SUBCUTANEOUS | 0 refills | Status: DC
  Filled 2023-02-04: qty 2, 28d supply, fill #0
  Filled 2023-03-02: qty 2, 28d supply, fill #1
  Filled 2023-03-26: qty 2, 28d supply, fill #2

## 2023-02-04 NOTE — Assessment & Plan Note (Signed)
Chronic, previously elevated Wishes to decline start of statin Was started on bempedoic acid 180 in addition to zetia 10 mg; however, patient currently only on zetia Repeat LDL Goal of <70 recommend diet low in saturated fat and regular exercise - 30 min at least 5 times per week

## 2023-02-04 NOTE — Assessment & Plan Note (Signed)
Chronic, with associated HTN and HLD as well as obesity A1c goal of <7% Previous NFG of 120s. Goal <100.  Continue to recommend balanced, lower carb meals. Smaller meal size, adding snacks. Choosing water as drink of choice and increasing purposeful exercise.

## 2023-02-04 NOTE — Assessment & Plan Note (Signed)
Chronic, borderline Defer medication changes at this time Continue to emphasize salt reduced diet and exercise to help Goal of <129/<79 Previously started on farxiga 10 mg alone; discussed home/work checks to address possible addition of norvasc 5 mg to assist

## 2023-02-04 NOTE — Assessment & Plan Note (Signed)
Chronic, stable Will change to mounjaro to assist with weight mgmt given max dose of ozempic Body mass index is 42.1 kg/m. Discussed importance of healthy weight management Discussed diet and exercise

## 2023-02-04 NOTE — Patient Instructions (Signed)
The CDC recommends two doses of Shingrix (the new shingles vaccine) separated by 2 to 6 months for adults age 62 years and older. I recommend checking with your insurance plan regarding coverage for this vaccine.    

## 2023-02-05 ENCOUNTER — Other Ambulatory Visit: Payer: Self-pay | Admitting: Family Medicine

## 2023-02-05 ENCOUNTER — Other Ambulatory Visit: Payer: Self-pay

## 2023-02-05 LAB — LIPID PANEL
Chol/HDL Ratio: 3.6 ratio (ref 0.0–4.4)
Cholesterol, Total: 182 mg/dL (ref 100–199)
HDL: 51 mg/dL (ref >39)
LDL Chol Calc (NIH): 104 mg/dL — ABNORMAL HIGH (ref 0–99)
Triglycerides: 156 mg/dL — ABNORMAL HIGH (ref 0–149)
VLDL Cholesterol Cal: 27 mg/dL (ref 5–40)

## 2023-02-05 LAB — CBC WITH DIFFERENTIAL/PLATELET
Basophils Absolute: 0.1 10*3/uL (ref 0.0–0.2)
Basos: 1 %
EOS (ABSOLUTE): 0.4 10*3/uL (ref 0.0–0.4)
Eos: 5 %
Hematocrit: 47.1 % — ABNORMAL HIGH (ref 34.0–46.6)
Hemoglobin: 15.7 g/dL (ref 11.1–15.9)
Immature Grans (Abs): 0 10*3/uL (ref 0.0–0.1)
Immature Granulocytes: 0 %
Lymphocytes Absolute: 2.4 10*3/uL (ref 0.7–3.1)
Lymphs: 33 %
MCH: 29.1 pg (ref 26.6–33.0)
MCHC: 33.3 g/dL (ref 31.5–35.7)
MCV: 87 fL (ref 79–97)
Monocytes Absolute: 0.8 10*3/uL (ref 0.1–0.9)
Monocytes: 11 %
Neutrophils Absolute: 3.6 10*3/uL (ref 1.4–7.0)
Neutrophils: 50 %
Platelets: 248 10*3/uL (ref 150–450)
RBC: 5.39 x10E6/uL — ABNORMAL HIGH (ref 3.77–5.28)
RDW: 13.7 % (ref 11.7–15.4)
WBC: 7.2 10*3/uL (ref 3.4–10.8)

## 2023-02-05 LAB — COMPREHENSIVE METABOLIC PANEL
ALT: 59 IU/L — ABNORMAL HIGH (ref 0–32)
AST: 33 IU/L (ref 0–40)
Albumin/Globulin Ratio: 1.7 (ref 1.2–2.2)
Albumin: 4.3 g/dL (ref 3.9–4.9)
Alkaline Phosphatase: 61 IU/L (ref 44–121)
BUN/Creatinine Ratio: 20 (ref 12–28)
BUN: 19 mg/dL (ref 8–27)
Bilirubin Total: 0.4 mg/dL (ref 0.0–1.2)
CO2: 19 mmol/L — ABNORMAL LOW (ref 20–29)
Calcium: 9.5 mg/dL (ref 8.7–10.3)
Chloride: 104 mmol/L (ref 96–106)
Creatinine, Ser: 0.95 mg/dL (ref 0.57–1.00)
Globulin, Total: 2.6 g/dL (ref 1.5–4.5)
Glucose: 126 mg/dL — ABNORMAL HIGH (ref 70–99)
Potassium: 5.1 mmol/L (ref 3.5–5.2)
Sodium: 140 mmol/L (ref 134–144)
Total Protein: 6.9 g/dL (ref 6.0–8.5)
eGFR: 68 mL/min/{1.73_m2} (ref >59)

## 2023-02-05 LAB — HEMOGLOBIN A1C
Est. average glucose Bld gHb Est-mCnc: 157 mg/dL
Hgb A1c MFr Bld: 7.1 % — ABNORMAL HIGH (ref 4.8–5.6)

## 2023-02-05 MED ORDER — BEMPEDOIC ACID 180 MG PO TABS
180.0000 mg | ORAL_TABLET | Freq: Every day | ORAL | 1 refills | Status: DC
  Filled 2023-02-05 – 2023-04-02 (×2): qty 30, 30d supply, fill #0

## 2023-02-05 NOTE — Progress Notes (Signed)
A1c continues to show improvement; Continue to recommend balanced, lower carb meals. Smaller meal size, adding snacks. Choosing water as drink of choice and increasing purposeful exercise. Stable cell count.  Stable metabolic panel. Cholesterol is improved; total is reduced as is LDL. Will try to obtain bempedoic acid to assist with further LDL decrease to reach goal of <70.

## 2023-02-06 ENCOUNTER — Telehealth: Payer: Self-pay | Admitting: Family Medicine

## 2023-02-17 ENCOUNTER — Other Ambulatory Visit: Payer: Self-pay

## 2023-02-18 ENCOUNTER — Other Ambulatory Visit (HOSPITAL_COMMUNITY): Payer: Self-pay

## 2023-02-20 ENCOUNTER — Other Ambulatory Visit: Payer: Self-pay

## 2023-02-20 ENCOUNTER — Other Ambulatory Visit (HOSPITAL_COMMUNITY): Payer: Self-pay

## 2023-03-10 ENCOUNTER — Other Ambulatory Visit: Payer: Self-pay

## 2023-03-10 MED ORDER — NA SULFATE-K SULFATE-MG SULF 17.5-3.13-1.6 GM/177ML PO SOLN
ORAL | 0 refills | Status: DC
  Filled 2023-03-10: qty 354, 1d supply, fill #0

## 2023-03-12 ENCOUNTER — Other Ambulatory Visit (HOSPITAL_COMMUNITY): Payer: Self-pay

## 2023-03-18 ENCOUNTER — Other Ambulatory Visit (HOSPITAL_COMMUNITY): Payer: Self-pay

## 2023-03-20 ENCOUNTER — Other Ambulatory Visit (HOSPITAL_COMMUNITY): Payer: Self-pay

## 2023-03-26 ENCOUNTER — Other Ambulatory Visit: Payer: Self-pay

## 2023-04-02 ENCOUNTER — Other Ambulatory Visit: Payer: Self-pay | Admitting: Family Medicine

## 2023-04-02 ENCOUNTER — Other Ambulatory Visit: Payer: Self-pay

## 2023-04-02 DIAGNOSIS — D225 Melanocytic nevi of trunk: Secondary | ICD-10-CM | POA: Diagnosis not present

## 2023-04-02 DIAGNOSIS — L720 Epidermal cyst: Secondary | ICD-10-CM | POA: Diagnosis not present

## 2023-04-02 DIAGNOSIS — D2271 Melanocytic nevi of right lower limb, including hip: Secondary | ICD-10-CM | POA: Diagnosis not present

## 2023-04-02 DIAGNOSIS — L821 Other seborrheic keratosis: Secondary | ICD-10-CM | POA: Diagnosis not present

## 2023-04-02 DIAGNOSIS — D2272 Melanocytic nevi of left lower limb, including hip: Secondary | ICD-10-CM | POA: Diagnosis not present

## 2023-04-02 DIAGNOSIS — L400 Psoriasis vulgaris: Secondary | ICD-10-CM | POA: Diagnosis not present

## 2023-04-02 DIAGNOSIS — D2261 Melanocytic nevi of right upper limb, including shoulder: Secondary | ICD-10-CM | POA: Diagnosis not present

## 2023-04-02 DIAGNOSIS — D2262 Melanocytic nevi of left upper limb, including shoulder: Secondary | ICD-10-CM | POA: Diagnosis not present

## 2023-04-02 MED FILL — Ezetimibe Tab 10 MG: ORAL | 90 days supply | Qty: 90 | Fill #0 | Status: AC

## 2023-04-03 ENCOUNTER — Other Ambulatory Visit: Payer: Self-pay

## 2023-04-07 ENCOUNTER — Other Ambulatory Visit: Payer: Self-pay

## 2023-04-07 ENCOUNTER — Other Ambulatory Visit: Payer: Self-pay | Admitting: Family Medicine

## 2023-04-07 MED FILL — Azelastine HCl Nasal Spray 0.1% (137 MCG/SPRAY): NASAL | 50 days supply | Qty: 30 | Fill #0 | Status: AC

## 2023-04-09 ENCOUNTER — Other Ambulatory Visit: Payer: Self-pay

## 2023-04-09 ENCOUNTER — Other Ambulatory Visit (HOSPITAL_COMMUNITY): Payer: Self-pay

## 2023-04-10 ENCOUNTER — Other Ambulatory Visit (HOSPITAL_COMMUNITY): Payer: Self-pay

## 2023-04-10 ENCOUNTER — Other Ambulatory Visit: Payer: Self-pay | Admitting: General Surgery

## 2023-04-10 ENCOUNTER — Other Ambulatory Visit: Payer: Self-pay

## 2023-04-10 DIAGNOSIS — Z1231 Encounter for screening mammogram for malignant neoplasm of breast: Secondary | ICD-10-CM

## 2023-04-14 ENCOUNTER — Other Ambulatory Visit (HOSPITAL_COMMUNITY): Payer: Self-pay

## 2023-04-15 ENCOUNTER — Other Ambulatory Visit: Payer: Self-pay | Admitting: Pharmacist

## 2023-04-15 ENCOUNTER — Other Ambulatory Visit: Payer: Self-pay

## 2023-04-15 ENCOUNTER — Other Ambulatory Visit (HOSPITAL_COMMUNITY): Payer: Self-pay

## 2023-04-15 MED ORDER — BIMZELX 160 MG/ML ~~LOC~~ SOAJ
SUBCUTANEOUS | 5 refills | Status: DC
  Filled 2023-04-15: qty 2, 28d supply, fill #0
  Filled 2023-06-04: qty 2, 28d supply, fill #1
  Filled 2023-08-04: qty 2, 28d supply, fill #2
  Filled 2023-09-22: qty 2, 28d supply, fill #3
  Filled 2023-11-24: qty 2, 28d supply, fill #4
  Filled 2024-01-20: qty 2, 28d supply, fill #5

## 2023-04-15 MED ORDER — BIMZELX 160 MG/ML ~~LOC~~ SOAJ: SUBCUTANEOUS | 5 refills | Status: DC

## 2023-04-20 ENCOUNTER — Other Ambulatory Visit: Payer: Self-pay

## 2023-04-20 ENCOUNTER — Other Ambulatory Visit: Payer: Self-pay | Admitting: Family Medicine

## 2023-04-20 ENCOUNTER — Other Ambulatory Visit (HOSPITAL_COMMUNITY): Payer: Self-pay

## 2023-04-21 ENCOUNTER — Other Ambulatory Visit: Payer: Self-pay

## 2023-04-21 NOTE — Telephone Encounter (Signed)
Requested medications are due for refill today.  yes  Requested medications are on the active medications list.  yes  Last refill. 02/04/2023   Future visit scheduled.   yes  Notes to clinic.  Medication not assigned to a protocol. Please review for refill.    Requested Prescriptions  Pending Prescriptions Disp Refills   MOUNJARO 10 MG/0.5ML Pen [Pharmacy Med Name: tirzepatide (MOUNJARO) 10 MG/0.5ML Pen] 6 mL 0    Sig: Inject 10 mg into the skin once a week.     Off-Protocol Failed - 04/20/2023  9:28 AM      Failed - Medication not assigned to a protocol, review manually.      Passed - Valid encounter within last 12 months    Recent Outpatient Visits           2 months ago Morbid obesity [facility])   [place] [facility] Merita Norton T, FNP   4 months ago Encounter for medication review   [facility] [place], Cornelius Moras, RPH-CPP   4 months ago Hypertension associated with diabetes [facility])   [place] [facility] Merita Norton T, FNP   9 months ago Diabetes mellitus without complication [facility])   [place] [facility] [place], Oswaldo Conroy, PA-C   9 months ago Encounter for medication review   [facility] Drucilla Chalet, RPH-CPP       Future Appointments             In 1 month Suzie Portela, Daryl Eastern, FNP [facility], [facility]

## 2023-04-24 ENCOUNTER — Other Ambulatory Visit: Payer: Self-pay

## 2023-04-27 ENCOUNTER — Other Ambulatory Visit: Payer: Self-pay

## 2023-04-27 ENCOUNTER — Other Ambulatory Visit: Payer: Self-pay | Admitting: Family Medicine

## 2023-04-27 MED ORDER — TIRZEPATIDE 10 MG/0.5ML ~~LOC~~ SOAJ
10.0000 mg | SUBCUTANEOUS | 0 refills | Status: DC
  Filled 2023-04-27: qty 2, 28d supply, fill #0
  Filled 2023-05-26: qty 2, 28d supply, fill #1

## 2023-04-27 NOTE — Telephone Encounter (Signed)
Medication Refill - Medication: Mounjaro 4 hots, once a week.  She is taking her lst shot today.  She will need a new refill for next week.  Has the patient contacted their pharmacy? Yes.   (Agent: If no, request that the patient contact the pharmacy for the refill. If patient does not wish to contact the pharmacy document the reason why and proceed with request.) (Agent: If yes, when and what did the pharmacy advise?)  Preferred Pharmacy (with phone number or street name): [facility] employee phamacy Has the patient been seen for an appointment in the last year OR does the patient have an upcoming appointment? Yes.    Agent: Please be advised that RX refills may take up to 3 business days. We ask that you follow-up with your pharmacy.

## 2023-04-27 NOTE — Telephone Encounter (Signed)
Copied from CRM [id number]. Topic: General - Other >> Apr 24, 2023  3:23 PM Everette C wrote: Reason for CRM: The patient has called to follow up on a previously submitted request for Rx #: [id number]  tirzepatide [facility]) 10 MG/0.5ML Pen [[id number]]   Please contact further when possible

## 2023-05-03 ENCOUNTER — Other Ambulatory Visit: Payer: Self-pay

## 2023-05-04 ENCOUNTER — Other Ambulatory Visit: Payer: Self-pay

## 2023-05-04 LAB — HM DIABETES EYE EXAM

## 2023-05-04 MED ORDER — NEOMYCIN-POLYMYXIN-DEXAMETH 3.5-10000-0.1 OP OINT
TOPICAL_OINTMENT | OPHTHALMIC | 0 refills | Status: AC
  Filled 2023-05-04: qty 3.5, 7d supply, fill #0

## 2023-05-26 ENCOUNTER — Other Ambulatory Visit: Payer: Self-pay

## 2023-05-26 ENCOUNTER — Encounter: Payer: Self-pay | Admitting: Family Medicine

## 2023-05-26 ENCOUNTER — Ambulatory Visit: Payer: 59 | Admitting: Family Medicine

## 2023-05-26 VITALS — BP 134/80 | HR 79 | Wt 250.0 lb

## 2023-05-26 DIAGNOSIS — E1165 Type 2 diabetes mellitus with hyperglycemia: Secondary | ICD-10-CM

## 2023-05-26 DIAGNOSIS — Z794 Long term (current) use of insulin: Secondary | ICD-10-CM | POA: Diagnosis not present

## 2023-05-26 DIAGNOSIS — I152 Hypertension secondary to endocrine disorders: Secondary | ICD-10-CM

## 2023-05-26 DIAGNOSIS — E785 Hyperlipidemia, unspecified: Secondary | ICD-10-CM | POA: Diagnosis not present

## 2023-05-26 DIAGNOSIS — E1159 Type 2 diabetes mellitus with other circulatory complications: Secondary | ICD-10-CM | POA: Diagnosis not present

## 2023-05-26 DIAGNOSIS — E1169 Type 2 diabetes mellitus with other specified complication: Secondary | ICD-10-CM | POA: Diagnosis not present

## 2023-05-26 LAB — POCT GLYCOSYLATED HEMOGLOBIN (HGB A1C): Hemoglobin A1C: 6.2 % — AB (ref 4.0–5.6)

## 2023-05-26 MED ORDER — EZETIMIBE 10 MG PO TABS
10.0000 mg | ORAL_TABLET | Freq: Every day | ORAL | 3 refills | Status: DC
Start: 2023-05-26 — End: 2024-02-24
  Filled 2023-05-26 – 2023-07-14 (×2): qty 90, 90d supply, fill #0
  Filled 2023-10-05: qty 90, 90d supply, fill #1
  Filled 2024-01-05: qty 90, 90d supply, fill #2

## 2023-05-26 MED ORDER — TIRZEPATIDE 12.5 MG/0.5ML ~~LOC~~ SOAJ
12.5000 mg | SUBCUTANEOUS | 3 refills | Status: DC
  Filled 2023-05-26: qty 2, 28d supply, fill #0
  Filled 2023-05-26: qty 6, 84d supply, fill #0
  Filled 2023-06-28: qty 2, 28d supply, fill #1
  Filled 2023-07-31: qty 2, 28d supply, fill #2
  Filled 2023-08-25: qty 2, 28d supply, fill #3
  Filled 2023-09-20: qty 2, 28d supply, fill #4
  Filled 2023-10-15: qty 2, 28d supply, fill #5
  Filled 2023-11-19: qty 2, 28d supply, fill #6
  Filled 2023-12-13: qty 2, 28d supply, fill #7
  Filled 2024-01-17: qty 2, 28d supply, fill #8
  Filled 2024-02-11: qty 2, 28d supply, fill #9

## 2023-05-26 NOTE — Assessment & Plan Note (Signed)
Chronic, slight improvement Body mass index is 41.6 kg/m. Discussed importance of healthy weight management Discussed diet and exercise

## 2023-05-26 NOTE — Progress Notes (Signed)
Established patient visit  Patient: Cassandra Munoz   DOB: 1961-04-23   62 y.o. Female  MRN: [id number] Visit Date: 05/26/2023  Today's healthcare provider: [name], FNP  Introduced to nurse practitioner role and practice setting.  All questions answered.  Discussed provider/patient relationship and expectations.  Subjective    HPI   Medications: Outpatient Medications Prior to Visit  Medication Sig   Azelastine HCl 137 MCG/SPRAY SOLN Place 1-2 sprays into both nostrils every 12 (twelve) hours as needed for drainage.   bimekizumab-bkzx (BIMZELX) 160 MG/ML pen Inject 320mg  subcutaneously every 8 weeks ( maintenance dose )   dapagliflozin propanediol (FARXIGA) 10 MG TABS tablet Take 1 tablet (10 mg total) by mouth daily every morning   Lancets (FREESTYLE) lancets Use to check blood sugar 2 times a day.   Na Sulfate-K Sulfate-Mg Sulf 17.5-3.13-1.6 GM/177ML SOLN Take both bottles at the times instructed by your provider.   neomycin-polymyxin b-dexamethasone (MAXITROL) 3.5-10000-0.1 OINT Apply a small amount on eyelid 4 (four) times a day   nystatin-triamcinolone (MYCOLOG II) cream Apply 1 application topically 2 (two) times daily. Use for 10-14 days   ondansetron (ZOFRAN) 8 MG tablet Take 1 tablet (8 mg total) by mouth every 8 (eight) hours as needed for nausea or vomiting.   pramoxine (SARNA SENSITIVE) 1 % LOTN Apply to affected areas once or twice daily as needed   triamcinolone (KENALOG) 0.147 MG/GM topical spray Apply topically once or twice daily until clear then as needed   vitamin B-12 (CYANOCOBALAMIN) 100 MCG tablet Take 100 mcg by mouth daily.   [DISCONTINUED] ezetimibe (ZETIA) 10 MG tablet Take 1 tablet (10 mg total) by mouth daily.   [DISCONTINUED] tirzepatide (MOUNJARO) 10 MG/0.5ML Pen Inject 10 mg into the skin once a week.   [DISCONTINUED] Bempedoic Acid 180 MG TABS Take 1 tablet (180 mg total) by mouth daily. Take in addition to Zetia. (Patient not taking: Reported on  05/26/2023)   [DISCONTINUED] Multiple Vitamin (MULTI-VITAMINS) TABS Take by mouth. (Patient not taking: Reported on 02/04/2023)   [DISCONTINUED] OZEMPIC, 2 MG/DOSE, 8 MG/3ML SOPN Inject 2 mg into the skin once a week.   No facility-administered medications prior to visit.       Objective    BP 134/80 (BP Location: Right Arm, Patient Position: Sitting, Cuff Size: Large)   Pulse 79   Wt 250 lb (113.4 kg)   LMP 05/01/2013   BMI 41.60 kg/m   Physical Exam Vitals and nursing note reviewed.  Constitutional:      General: She is not in acute distress.    Appearance: Normal appearance. She is obese. She is not ill-appearing, toxic-appearing or diaphoretic.  HENT:     Head: Normocephalic and atraumatic.  Cardiovascular:     Rate and Rhythm: Normal rate and regular rhythm.     Pulses: Normal pulses.     Heart sounds: Normal heart sounds. No murmur heard.    No friction rub. No gallop.  Pulmonary:     Effort: Pulmonary effort is normal. No respiratory distress.     Breath sounds: Normal breath sounds. No stridor. No wheezing, rhonchi or rales.  Chest:     Chest wall: No tenderness.  Musculoskeletal:        General: No swelling, tenderness, deformity or signs of injury. Normal range of motion.     Right lower leg: No edema.     Left lower leg: No edema.  Skin:    General: Skin is warm and dry.  Capillary Refill: Capillary refill takes less than 2 seconds.     Coloration: Skin is not jaundiced or pale.     Findings: No bruising, erythema, lesion or rash.  Neurological:     General: No focal deficit present.     Mental Status: She is alert and oriented to person, place, and time. Mental status is at baseline.     Cranial Nerves: No cranial nerve deficit.     Sensory: No sensory deficit.     Motor: No weakness.     Coordination: Coordination normal.  Psychiatric:        Mood and Affect: Mood normal.        Behavior: Behavior normal.        Thought Content: Thought content  normal.        Judgment: Judgment normal.     Results for orders placed or performed in visit on 05/26/23  POCT HgB A1C  Result Value Ref Range   Hemoglobin A1C 6.2 (A) 4.0 - 5.6 %   HbA1c POC (<> result, manual entry)     HbA1c, POC (prediabetic range)     HbA1c, POC (controlled diabetic range)      Assessment & Plan     Problem List Items Addressed This Visit       Cardiovascular and Mediastinum   Hypertension associated with diabetes (HCC)    Chronic, remains elevated Pt wishes to continue to focus on BG mgmt and HLD mgmt BP goal 129/79 Continue to focus on heart healthy diet, weight mgmt, and low sodium intake The 10-year ASCVD risk score (Arnett DK, et al., 2019) is: 8.2%       Relevant Medications   ezetimibe (ZETIA) 10 MG tablet   tirzepatide (MOUNJARO) 12.5 MG/0.5ML Pen     Endocrine   Hyperlipidemia associated with type 2 diabetes mellitus (HCC)    Chronic, stable Request to refill zetia 10 mg Discussed importance of healthy weight management Discussed diet and exercise       Relevant Medications   ezetimibe (ZETIA) 10 MG tablet   tirzepatide (MOUNJARO) 12.5 MG/0.5ML Pen   Type 2 diabetes mellitus with hyperglycemia, with long-term current use of insulin (HCC) - Primary    Chronic, improved A1c at goal Continue to recommend balanced, lower carb meals. Smaller meal size, adding snacks. Choosing water as drink of choice and increasing purposeful exercise. Will increase mounjaro to 12.5 mg weekly to assist with weight mgmt and continue good glycemic control      Relevant Medications   tirzepatide (MOUNJARO) 12.5 MG/0.5ML Pen   Other Relevant Orders   POCT HgB A1C (Completed)     Other   Morbid obesity (HCC)    Chronic, slight improvement Body mass index is 41.6 kg/m. Discussed importance of healthy weight management Discussed diet and exercise       Relevant Medications   tirzepatide (MOUNJARO) 12.5 MG/0.5ML Pen   Return in about 3 months  (around 08/26/2023) for chonic disease management.     Leilani Merl, FNP, have reviewed all documentation for this visit. The documentation on 05/26/23 for the exam, diagnosis, procedures, and orders are all accurate and complete.  [name], FNP  [facility] [phone] (phone) [phone] (fax)  [facility]

## 2023-05-26 NOTE — Assessment & Plan Note (Signed)
Chronic, stable Request to refill zetia 10 mg Discussed importance of healthy weight management Discussed diet and exercise

## 2023-05-26 NOTE — Assessment & Plan Note (Signed)
Chronic, remains elevated Pt wishes to continue to focus on BG mgmt and HLD mgmt BP goal 129/79 Continue to focus on heart healthy diet, weight mgmt, and low sodium intake The 10-year ASCVD risk score (Arnett DK, et al., 2019) is: 8.2%

## 2023-05-26 NOTE — Assessment & Plan Note (Signed)
Chronic, improved A1c at goal Continue to recommend balanced, lower carb meals. Smaller meal size, adding snacks. Choosing water as drink of choice and increasing purposeful exercise. Will increase mounjaro to 12.5 mg weekly to assist with weight mgmt and continue good glycemic control

## 2023-06-04 ENCOUNTER — Other Ambulatory Visit (HOSPITAL_COMMUNITY): Payer: Self-pay

## 2023-06-08 ENCOUNTER — Other Ambulatory Visit (HOSPITAL_COMMUNITY): Payer: Self-pay

## 2023-06-18 ENCOUNTER — Encounter: Payer: Self-pay | Admitting: Internal Medicine

## 2023-06-23 ENCOUNTER — Encounter: Payer: Self-pay | Admitting: Internal Medicine

## 2023-06-23 DIAGNOSIS — Z1211 Encounter for screening for malignant neoplasm of colon: Secondary | ICD-10-CM | POA: Diagnosis not present

## 2023-06-23 DIAGNOSIS — Z800 Family history of malignant neoplasm of digestive organs: Secondary | ICD-10-CM | POA: Diagnosis not present

## 2023-06-24 ENCOUNTER — Other Ambulatory Visit: Payer: Self-pay

## 2023-06-24 ENCOUNTER — Encounter
Admission: RE | Disposition: A | Discharge status: Home / Self Care | Payer: Self-pay | Source: Home / Self Care | Attending: Internal Medicine

## 2023-06-24 ENCOUNTER — Ambulatory Visit: Payer: 59 | Admitting: Certified Registered"

## 2023-06-24 ENCOUNTER — Encounter: Payer: Self-pay | Admitting: Internal Medicine

## 2023-06-24 ENCOUNTER — Ambulatory Visit
Admission: RE | Admit: 2023-06-24 | Discharge: 2023-06-24 | Disposition: A | Discharge status: Home / Self Care | Payer: 59 | Attending: Internal Medicine | Admitting: Internal Medicine

## 2023-06-24 DIAGNOSIS — Z794 Long term (current) use of insulin: Secondary | ICD-10-CM | POA: Diagnosis not present

## 2023-06-24 DIAGNOSIS — Z7985 Long-term (current) use of injectable non-insulin antidiabetic drugs: Secondary | ICD-10-CM | POA: Diagnosis not present

## 2023-06-24 DIAGNOSIS — Z800 Family history of malignant neoplasm of digestive organs: Secondary | ICD-10-CM | POA: Diagnosis not present

## 2023-06-24 DIAGNOSIS — E119 Type 2 diabetes mellitus without complications: Secondary | ICD-10-CM | POA: Insufficient documentation

## 2023-06-24 DIAGNOSIS — I10 Essential (primary) hypertension: Secondary | ICD-10-CM | POA: Diagnosis not present

## 2023-06-24 DIAGNOSIS — Z6841 Body Mass Index (BMI) 40.0 and over, adult: Secondary | ICD-10-CM | POA: Diagnosis not present

## 2023-06-24 DIAGNOSIS — Z1211 Encounter for screening for malignant neoplasm of colon: Secondary | ICD-10-CM | POA: Diagnosis not present

## 2023-06-24 HISTORY — PX: COLONOSCOPY WITH PROPOFOL: SHX5780

## 2023-06-24 LAB — GLUCOSE, CAPILLARY: Glucose-Capillary: 123 mg/dL — ABNORMAL HIGH (ref 70–99)

## 2023-06-24 SURGERY — COLONOSCOPY WITH PROPOFOL
Anesthesia: General

## 2023-06-24 MED ORDER — ONDANSETRON HCL 4 MG/2ML IJ SOLN
INTRAMUSCULAR | Status: DC | PRN
  Administered 2023-06-24 (×2): 4 mg via INTRAVENOUS

## 2023-06-24 MED ORDER — DEXMEDETOMIDINE HCL IN NACL 200 MCG/50ML IV SOLN
INTRAVENOUS | Status: DC | PRN
  Administered 2023-06-24: 4 ug via INTRAVENOUS
  Administered 2023-06-24: 12 ug via INTRAVENOUS
  Administered 2023-06-24: 4 ug via INTRAVENOUS

## 2023-06-24 MED ORDER — PROPOFOL 10 MG/ML IV BOLUS
INTRAVENOUS | Status: AC
  Filled 2023-06-24: qty 40

## 2023-06-24 MED ORDER — SODIUM CHLORIDE 0.9 % IV SOLN: INTRAVENOUS | Status: DC

## 2023-06-24 MED ORDER — PROPOFOL 500 MG/50ML IV EMUL
INTRAVENOUS | Status: DC | PRN
  Administered 2023-06-24: 20 mg via INTRAVENOUS
  Administered 2023-06-24: 120 ug/kg/min via INTRAVENOUS

## 2023-06-24 MED ORDER — SODIUM CHLORIDE 0.9 % IV SOLN
INTRAVENOUS | Status: DC | PRN
Start: 2023-06-24 — End: 2023-06-24

## 2023-06-24 NOTE — Op Note (Signed)
[facility] Patient Name: Cassandra Munoz Procedure Date: 06/24/2023 8:48 AM MRN: [id number] Account #: [id number] Date of Birth: Oct 04, 1961 Admit Type: Outpatient Age: 62 Room: [facility] ENDO ROOM 2 Gender: Female Note Status: Finalized Instrument Name: Prentice Docker [id number] Procedure:             Colonoscopy Indications:           Screening in patient at increased risk: Family history                         of 1st-degree relative with colorectal cancer Providers:             Boykin Nearing. Norma Fredrickson MD, MD Referring MD:          Daryl Eastern. Suzie Portela (Referring MD) Medicines:             Propofol per Anesthesia Complications:         No immediate complications. Procedure:             Pre-Anesthesia Assessment:                        - The risks and benefits of the procedure and the                         sedation options and risks were discussed with the                         patient. All questions were answered and informed                         consent was obtained.                        - Patient identification and proposed procedure were                         verified prior to the procedure by the nurse. The                         procedure was verified in the procedure room.                        - ASA Grade Assessment: III - A patient with severe                         systemic disease.                        - After reviewing the risks and benefits, the patient                         was deemed in satisfactory condition to undergo the                         procedure.                        After obtaining informed consent, the colonoscope was  passed under direct vision. Throughout the procedure,                         the patient's blood pressure, pulse, and oxygen                         saturations were monitored continuously. The                         Colonoscope was introduced through the anus and                          advanced to the the cecum, identified by appendiceal                         orifice and ileocecal valve. The colonoscopy was                         performed with ease. The patient tolerated the                         procedure well. The quality of the bowel preparation                         was good. The ileocecal valve, appendiceal orifice,                         and rectum were photographed. Findings:      The perianal and digital rectal examinations were normal. Pertinent       negatives include normal sphincter tone and no palpable rectal lesions.      The entire examined colon appeared normal on direct and retroflexion       views. Impression:            - The entire examined colon is normal on direct and                         retroflexion views.                        - No specimens collected. Recommendation:        - Patient has a contact number available for                         emergencies. The signs and symptoms of potential                         delayed complications were discussed with the patient.                         Return to normal activities tomorrow. Written                         discharge instructions were provided to the patient.                        - Resume previous diet.                        -  Continue present medications.                        - Repeat colonoscopy in 5 years for screening purposes.                        - Return to GI office PRN.                        - The findings and recommendations were discussed with                         the patient. Procedure Code(s):     --- Professional ---                        Z6109, Colorectal cancer screening; colonoscopy on                         individual at high risk Diagnosis Code(s):     --- Professional ---                        Z80.0, Family history of malignant neoplasm of                         digestive organs CPT copyright 2022 American Medical Association. All  rights reserved. The codes documented in this report are preliminary and upon coder review may  be revised to meet current compliance requirements. Stanton Kidney MD, MD 06/24/2023 9:16:31 AM This report has been signed electronically. Number of Addenda: 0 Note Initiated On: 06/24/2023 8:48 AM Scope Withdrawal Time: 0 hours 6 minutes 34 seconds  Total Procedure Duration: 0 hours 8 minutes 38 seconds  Estimated Blood Loss:  Estimated blood loss: none.      [facility]

## 2023-06-24 NOTE — H&P (Signed)
Outpatient short stay form Pre-procedure 06/24/2023 8:44 AM Cassandra Dulski K. Norma Fredrickson, M.D.  Primary Physician: Merita Norton, FNP  Reason for visit:  Colon cancer screening, FH colon cancer  History of present illness:   62 year old patient presenting for family history of colon cancer. Patient denies any change in bowel habits, rectal bleeding or involuntary weight loss.     Current Facility-Administered Medications:    0.9 %  sodium chloride infusion, , Intravenous, Continuous, Donold Marotto, Boykin Nearing, MD  Medications Prior to Admission  Medication Sig Dispense Refill Last Dose   dapagliflozin propanediol (FARXIGA) 10 MG TABS tablet Take 1 tablet (10 mg total) by mouth daily every morning 90 tablet 3 06/23/2023   ezetimibe (ZETIA) 10 MG tablet Take 1 tablet (10 mg total) by mouth daily. 90 tablet 3 06/23/2023   Azelastine HCl 137 MCG/SPRAY SOLN Place 1-2 sprays into both nostrils every 12 (twelve) hours as needed for drainage. 30 mL 1    bimekizumab-bkzx (BIMZELX) 160 MG/ML pen Inject 320mg  subcutaneously every 8 weeks ( maintenance dose ) 2 mL 5 06/20/2023   Lancets (FREESTYLE) lancets Use to check blood sugar 2 times a day. 200 each 12    Na Sulfate-K Sulfate-Mg Sulf 17.5-3.13-1.6 GM/177ML SOLN Take both bottles at the times instructed by your provider. 354 mL 0    neomycin-polymyxin b-dexamethasone (MAXITROL) 3.5-10000-0.1 OINT Apply a small amount on eyelid 4 (four) times a day 3.5 g 0    nystatin-triamcinolone (MYCOLOG II) cream Apply 1 application topically 2 (two) times daily. Use for 10-14 days 30 g 2    ondansetron (ZOFRAN) 8 MG tablet Take 1 tablet (8 mg total) by mouth every 8 (eight) hours as needed for nausea or vomiting. 20 tablet 1    pramoxine (SARNA SENSITIVE) 1 % LOTN Apply to affected areas once or twice daily as needed 237 mL 0    tirzepatide (MOUNJARO) 12.5 MG/0.5ML Pen Inject 12.5 mg into the skin once a week. 6 mL 3 06/15/2023   triamcinolone (KENALOG) 0.147 MG/GM topical spray  Apply topically once or twice daily until clear then as needed 100 g 3    vitamin B-12 (CYANOCOBALAMIN) 100 MCG tablet Take 100 mcg by mouth daily.        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 Melanocytic proliferation of uncertain malignant potential, base involved.   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    Physical Exam  Gen: Alert, oriented. Appears stated age.  HEENT: [place]/AT. PERRLA. Lungs: CTA, no wheezes. CV: RR nl S1, S2. Abd: soft, benign, no masses. BS+ Ext: No edema. Pulses 2+    Planned procedures: Proceed with colonoscopy. The patient understands the nature of the planned procedure, indications, risks, alternatives and potential complications including but not limited to bleeding, infection, perforation, damage to internal organs and possible oversedation/side effects from anesthesia. The patient agrees and gives consent to proceed.  Please refer to procedure notes for findings, recommendations and patient disposition/instructions.     Cassandra Arista K. Norma Fredrickson, M.D. Gastroenterology 06/24/2023  8:44 AM

## 2023-06-24 NOTE — Transfer of Care (Signed)
Immediate Anesthesia Transfer of Care Note  Patient: Cassandra Munoz  Procedure(s) Performed: COLONOSCOPY WITH PROPOFOL  Patient Location: PACU  Anesthesia Type:MAC  Level of Consciousness: awake  Airway & Oxygen Therapy: Patient Spontanous Breathing  Post-op Assessment: Report given to RN  Post vital signs: Reviewed  Last Vitals:  Vitals Value Taken Time  BP 104/73 06/24/23 0919  Temp 31F   Pulse 100 06/24/23 0919  Resp 21 06/24/23 0919  SpO2 97 % 06/24/23 0919  Vitals shown include unfiled device data.  Last Pain:  Vitals:   06/24/23 0843  TempSrc: Temporal  PainSc: 0-No pain         Complications: No notable events documented.

## 2023-06-24 NOTE — Anesthesia Preprocedure Evaluation (Addendum)
Anesthesia Evaluation  Patient identified by MRN, date of birth, ID band Patient awake    Reviewed: Allergy & Precautions, NPO status , Patient's Chart, lab work & pertinent test results  History of Anesthesia Complications Negative for: history of anesthetic complications  Airway Mallampati: III   Neck ROM: Full    Dental no notable dental hx.    Pulmonary neg pulmonary ROS   Pulmonary exam normal breath sounds clear to auscultation       Cardiovascular hypertension, Normal cardiovascular exam Rhythm:Regular Rate:Normal     Neuro/Psych negative neurological ROS     GI/Hepatic negative GI ROS,,,  Endo/Other  diabetes, Type 2, Insulin Dependent  Class 3 obesity  Renal/GU negative Renal ROS     Musculoskeletal   Abdominal   Peds  Hematology negative hematology ROS (+)   Anesthesia Other Findings Last dose of Mounjaro 06/15/23  Reproductive/Obstetrics                             Anesthesia Physical Anesthesia Plan  ASA: 3  Anesthesia Plan: General   Post-op Pain Management:    Induction: Intravenous  PONV Risk Score and Plan: 3 and Propofol infusion, TIVA and Treatment may vary due to age or medical condition  Airway Management Planned: Natural Airway  Additional Equipment:   Intra-op Plan:   Post-operative Plan:   Informed Consent: I have reviewed the patients History and Physical, chart, labs and discussed the procedure including the risks, benefits and alternatives for the proposed anesthesia with the patient or authorized representative who has indicated his/her understanding and acceptance.       Plan Discussed with: CRNA  Anesthesia Plan Comments: (LMA/GETA backup discussed.  Patient consented for risks of anesthesia including but not limited to:  - adverse reactions to medications - damage to eyes, teeth, lips or other oral mucosa - nerve damage due to positioning   - sore throat or hoarseness - damage to heart, brain, nerves, lungs, other parts of body or loss of life  Informed patient about role of CRNA in peri- and intra-operative care.  Patient voiced understanding.)       Anesthesia Quick Evaluation

## 2023-06-24 NOTE — Interval H&P Note (Signed)
History and Physical Interval Note:  06/24/2023 8:44 AM  Cassandra Munoz  has presented today for surgery, with the diagnosis of Z12.11 (ICD-10-CM) - Colon cancer screening Z80.0 (ICD-10-CM) - Family history of colon cancer.  The various methods of treatment have been discussed with the patient and family. After consideration of risks, benefits and other options for treatment, the patient has consented to  Procedure(s): COLONOSCOPY WITH PROPOFOL (N/A) as a surgical intervention.  The patient's history has been reviewed, patient examined, no change in status, stable for surgery.  I have reviewed the patient's chart and labs.  Questions were answered to the patient's satisfaction.     [place], [place]

## 2023-06-24 NOTE — Anesthesia Postprocedure Evaluation (Signed)
Anesthesia Post Note  Patient: Cassandra Munoz  Procedure(s) Performed: COLONOSCOPY WITH PROPOFOL  Patient location during evaluation: PACU Anesthesia Type: General Level of consciousness: awake and alert, oriented and patient cooperative Pain management: pain level controlled Vital Signs Assessment: post-procedure vital signs reviewed and stable Respiratory status: spontaneous breathing, nonlabored ventilation and respiratory function stable Cardiovascular status: blood pressure returned to baseline and stable Postop Assessment: adequate PO intake Anesthetic complications: no   No notable events documented.   Last Vitals:  Vitals:   06/24/23 0930 06/24/23 0940  BP:  123/75  Pulse: 85 74  Resp: 19 17  Temp:    SpO2: 99% 97%    Last Pain:  Vitals:   06/24/23 0843  TempSrc: Temporal  PainSc: 0-No pain                 Reed Breech

## 2023-06-25 ENCOUNTER — Encounter: Payer: Self-pay | Admitting: Internal Medicine

## 2023-06-30 ENCOUNTER — Other Ambulatory Visit: Payer: Self-pay

## 2023-07-02 ENCOUNTER — Ambulatory Visit
Admission: RE | Admit: 2023-07-02 | Discharge: 2023-07-02 | Disposition: A | Discharge status: Home / Self Care | Payer: 59 | Source: Ambulatory Visit | Attending: General Surgery | Admitting: General Surgery

## 2023-07-02 DIAGNOSIS — Z1231 Encounter for screening mammogram for malignant neoplasm of breast: Secondary | ICD-10-CM | POA: Diagnosis not present

## 2023-07-06 NOTE — Group Note (Deleted)

## 2023-07-10 ENCOUNTER — Other Ambulatory Visit: Payer: Self-pay

## 2023-07-13 ENCOUNTER — Other Ambulatory Visit (HOSPITAL_COMMUNITY): Payer: Self-pay

## 2023-07-14 ENCOUNTER — Other Ambulatory Visit: Payer: Self-pay

## 2023-07-18 ENCOUNTER — Encounter (HOSPITAL_COMMUNITY): Payer: Self-pay

## 2023-07-22 ENCOUNTER — Other Ambulatory Visit (HOSPITAL_COMMUNITY): Payer: Self-pay

## 2023-07-24 ENCOUNTER — Other Ambulatory Visit: Payer: Self-pay | Admitting: Physician Assistant

## 2023-07-24 ENCOUNTER — Other Ambulatory Visit: Payer: Self-pay

## 2023-07-24 DIAGNOSIS — E119 Type 2 diabetes mellitus without complications: Secondary | ICD-10-CM

## 2023-07-24 MED FILL — Dapagliflozin Propanediol Tab 10 MG (Base Equivalent): ORAL | 90 days supply | Qty: 90 | Fill #0 | Status: AC

## 2023-07-24 NOTE — Telephone Encounter (Signed)
Resent medication to pharmacy  Requested Prescriptions  Pending Prescriptions Disp Refills   dapagliflozin propanediol (FARXIGA) 10 MG TABS tablet [Pharmacy Med Name: dapagliflozin propanediol (FARXIGA) 10 MG Tab tablet] 90 tablet 0    Sig: Take 1 tablet (10 mg total) by mouth daily every morning     Endocrinology:  Diabetes - SGLT2 Inhibitors Passed - 07/24/2023  8:44 AM      Passed - Cr in normal range and within 360 days    Creat  Date Value Ref Range Status  11/23/2017 0.76 0.50 - 1.05 mg/dL Final    Comment:    For patients >25 years of age, the reference limit for Creatinine is approximately 13% higher for people identified as African-American. .    Creatinine, Ser  Date Value Ref Range Status  02/04/2023 0.95 0.57 - 1.00 mg/dL Final   Creatinine,U  Date Value Ref Range Status  11/23/2020 80.5 mg/dL Final         Passed - HBA1C is between 0 and 7.9 and within 180 days    Hemoglobin A1C  Date Value Ref Range Status  05/26/2023 6.2 (A) 4.0 - 5.6 % Final  11/18/2018 6.6  Final   HB A1C (BAYER DCA - WAIVED)  Date Value Ref Range Status  04/10/2022 6.7 (H) 4.8 - 5.6 % Final    Comment:             Prediabetes: 5.7 - 6.4          Diabetes: >6.4          Glycemic control for adults with diabetes: <7.0    Hgb A1c MFr Bld  Date Value Ref Range Status  02/04/2023 7.1 (H) 4.8 - 5.6 % Final    Comment:             Prediabetes: 5.7 - 6.4          Diabetes: >6.4          Glycemic control for adults with diabetes: <7.0          Passed - eGFR in normal range and within 360 days    GFR, Est African American  Date Value Ref Range Status  11/23/2017 102 > OR = 60 mL/min/1.66m2 Final   GFR calc Af Amer  Date Value Ref Range Status  11/22/2018 >60 >60 mL/min Final   GFR, Est Non African American  Date Value Ref Range Status  11/23/2017 88 > OR = 60 mL/min/1.8m2 Final   GFR, Estimated  Date Value Ref Range Status  08/01/2022 >60 >60 mL/min Final    Comment:     (NOTE) Calculated using the CKD-EPI Creatinine Equation (2021)    GFR  Date Value Ref Range Status  11/23/2020 75.98 >60.00 mL/min Final    Comment:    Calculated using the CKD-EPI Creatinine Equation (2021)   eGFR  Date Value Ref Range Status  02/04/2023 68 >59 mL/min/1.73 Final         Passed - Valid encounter within last 6 months    Recent Outpatient Visits           1 month ago Type 2 diabetes mellitus with hyperglycemia, with long-term current use of insulin (HCC)   [facility] [facility] [name], FNP   5 months ago Morbid obesity [facility])   [facility] [name], FNP   7 months ago Encounter for medication review   [place] [facility] [place], Jeannett Senior  L, RPH-CPP   8 months ago Hypertension associated with diabetes [facility])   [facility] Merita Norton T, FNP   1 year ago Diabetes mellitus without complication [facility])   [facility] Mecum, Oswaldo Conroy, PA-C       Future Appointments             In 1 month Suzie Portela, Daryl Eastern, FNP [facility]

## 2023-08-03 DIAGNOSIS — Z1231 Encounter for screening mammogram for malignant neoplasm of breast: Secondary | ICD-10-CM | POA: Diagnosis not present

## 2023-08-03 DIAGNOSIS — Z1331 Encounter for screening for depression: Secondary | ICD-10-CM | POA: Diagnosis not present

## 2023-08-03 DIAGNOSIS — Z01419 Encounter for gynecological examination (general) (routine) without abnormal findings: Secondary | ICD-10-CM | POA: Diagnosis not present

## 2023-08-03 LAB — HM PAP SMEAR

## 2023-08-03 LAB — RESULTS CONSOLE HPV: CHL HPV: NEGATIVE

## 2023-08-04 ENCOUNTER — Other Ambulatory Visit (HOSPITAL_COMMUNITY): Payer: Self-pay | Admitting: Pharmacy Technician

## 2023-08-04 ENCOUNTER — Other Ambulatory Visit (HOSPITAL_COMMUNITY): Payer: Self-pay

## 2023-08-04 NOTE — Progress Notes (Signed)
Specialty Pharmacy Refill Coordination Note  DAZHA KEMPA is a 62 y.o. female contacted today regarding refills of specialty medication(s) Bimekizumab-Bkzx   Patient requested Delivery   Delivery date: 08/07/23   Verified address: [address] [place]   Medication will be filled on 08/06/23.

## 2023-08-06 ENCOUNTER — Other Ambulatory Visit: Payer: Self-pay

## 2023-08-26 ENCOUNTER — Other Ambulatory Visit: Payer: Self-pay

## 2023-08-26 ENCOUNTER — Ambulatory Visit: Payer: 59 | Admitting: Family Medicine

## 2023-08-26 ENCOUNTER — Encounter: Payer: Self-pay | Admitting: Family Medicine

## 2023-08-26 VITALS — BP 134/66 | HR 80 | Ht 65.0 in | Wt 242.4 lb

## 2023-08-26 DIAGNOSIS — E1165 Type 2 diabetes mellitus with hyperglycemia: Secondary | ICD-10-CM | POA: Diagnosis not present

## 2023-08-26 DIAGNOSIS — I152 Hypertension secondary to endocrine disorders: Secondary | ICD-10-CM

## 2023-08-26 DIAGNOSIS — E1159 Type 2 diabetes mellitus with other circulatory complications: Secondary | ICD-10-CM

## 2023-08-26 DIAGNOSIS — Z780 Asymptomatic menopausal state: Secondary | ICD-10-CM | POA: Diagnosis not present

## 2023-08-26 DIAGNOSIS — Z794 Long term (current) use of insulin: Secondary | ICD-10-CM | POA: Diagnosis not present

## 2023-08-26 LAB — POCT GLYCOSYLATED HEMOGLOBIN (HGB A1C): Hemoglobin A1C: 6 % — AB (ref 4.0–5.6)

## 2023-08-26 MED ORDER — ESTRADIOL 0.025 MG/24HR TD PTWK
0.0250 mg | MEDICATED_PATCH | TRANSDERMAL | 0 refills | Status: DC
  Filled 2023-08-26: qty 4, 28d supply, fill #0

## 2023-08-26 NOTE — Assessment & Plan Note (Signed)
Trial of low dose topical estrogen to assist; reach back out regarding next refill/dosing options F/u with GYN as needed

## 2023-08-26 NOTE — Progress Notes (Signed)
Established patient visit   Patient: Cassandra Munoz   DOB: October 02, 1961   62 y.o. Female  MRN: [id number] Visit Date: 08/26/2023  Today's healthcare provider: [name], FNP  Introduced to nurse practitioner role and practice setting.  All questions answered.  Discussed provider/patient relationship and expectations.  Subjective    HPI HPI     Medical Management of Chronic Issues    Additional comments: 3 month follow-up. Patient reports checking at home with average reading around 115-120 mg/dL anytime but fasting it is usually around 100 mg/dL. No symptoms to report.         Comments   Believes she may have gluteal tendonitis after some research due to bilateral hip pain that has been present for a while. Reports it may be due to low estrogen but doesn't necessarily want to go on it. She would just like to know some options       Last edited by Acey Lav, CMA on 08/26/2023  1:10 PM.      Medications: Outpatient Medications Prior to Visit  Medication Sig   Azelastine HCl 137 MCG/SPRAY SOLN Place 1-2 sprays into both nostrils every 12 (twelve) hours as needed for drainage.   bimekizumab-bkzx (BIMZELX) 160 MG/ML pen Inject 320mg  subcutaneously every 8 weeks ( maintenance dose )   dapagliflozin propanediol (FARXIGA) 10 MG TABS tablet Take 1 tablet (10 mg total) by mouth every morning.   ezetimibe (ZETIA) 10 MG tablet Take 1 tablet (10 mg total) by mouth daily.   Lancets (FREESTYLE) lancets Use to check blood sugar 2 times a day.   neomycin-polymyxin b-dexamethasone (MAXITROL) 3.5-10000-0.1 OINT Apply a small amount on eyelid 4 (four) times a day   nystatin-triamcinolone (MYCOLOG II) cream Apply 1 application topically 2 (two) times daily. Use for 10-14 days   ondansetron (ZOFRAN) 8 MG tablet Take 1 tablet (8 mg total) by mouth every 8 (eight) hours as needed for nausea or vomiting.   pramoxine (SARNA SENSITIVE) 1 % LOTN Apply to affected areas once or twice daily as  needed   tirzepatide (MOUNJARO) 12.5 MG/0.5ML Pen Inject 12.5 mg into the skin once a week.   triamcinolone (KENALOG) 0.147 MG/GM topical spray Apply topically once or twice daily until clear then as needed   vitamin B-12 (CYANOCOBALAMIN) 100 MCG tablet Take 100 mcg by mouth daily. (Patient not taking: Reported on 08/26/2023)   [DISCONTINUED] Na Sulfate-K Sulfate-Mg Sulf 17.5-3.13-1.6 GM/177ML SOLN Take both bottles at the times instructed by your provider. (Patient not taking: Reported on 08/26/2023)   No facility-administered medications prior to visit.     Objective    BP 134/66 (BP Location: Right Arm, Patient Position: Sitting, Cuff Size: Large)   Pulse 80   Ht 5\' 5"  (1.651 m)   Wt 242 lb 6.4 oz (110 kg)   LMP 05/01/2013   SpO2 100%   BMI 40.34 kg/m   Physical Exam Vitals and nursing note reviewed.  Constitutional:      General: She is not in acute distress.    Appearance: Normal appearance. She is obese. She is not ill-appearing, toxic-appearing or diaphoretic.  HENT:     Head: Normocephalic and atraumatic.  Cardiovascular:     Rate and Rhythm: Normal rate and regular rhythm.     Pulses: Normal pulses.     Heart sounds: Normal heart sounds. No murmur heard.    No friction rub. No gallop.  Pulmonary:     Effort: Pulmonary effort is normal. No respiratory  distress.     Breath sounds: Normal breath sounds. No stridor. No wheezing, rhonchi or rales.  Chest:     Chest wall: No tenderness.  Musculoskeletal:        General: No swelling, tenderness, deformity or signs of injury. Normal range of motion.     Right lower leg: No edema.     Left lower leg: No edema.  Skin:    General: Skin is warm and dry.     Capillary Refill: Capillary refill takes less than 2 seconds.     Coloration: Skin is not jaundiced or pale.     Findings: No bruising, erythema, lesion or rash.  Neurological:     General: No focal deficit present.     Mental Status: She is alert and oriented to  person, place, and time. Mental status is at baseline.     Cranial Nerves: No cranial nerve deficit.     Sensory: No sensory deficit.     Motor: No weakness.     Coordination: Coordination normal.  Psychiatric:        Mood and Affect: Mood normal.        Behavior: Behavior normal.        Thought Content: Thought content normal.        Judgment: Judgment normal.     Results for orders placed or performed in visit on 08/26/23  POCT HgB A1C  Result Value Ref Range   Hemoglobin A1C 6.0 (A) 4.0 - 5.6 %   HbA1c POC (<> result, manual entry)     HbA1c, POC (prediabetic range)     HbA1c, POC (controlled diabetic range)      Assessment & Plan     Problem List Items Addressed This Visit       Cardiovascular and Mediastinum   Hypertension associated with diabetes (HCC)    Chronic, borderline Goal of 119/79 Continue farxiga 10         Endocrine   Type 2 diabetes mellitus with hyperglycemia, with long-term current use of insulin (HCC) - Primary    Chronic, stable Unsure if she wants to titrate mounjaro from 12.5 mg to 15 mg Continue to recommend balanced, lower carb meals. Smaller meal size, adding snacks. Choosing water as drink of choice and increasing purposeful exercise. A1c at goal      Relevant Orders   POCT HgB A1C (Completed)     Other   Morbid obesity (HCC)    Chronic, improved Continue to recommend balanced, lower carb meals. Smaller meal size, adding snacks. Choosing water as drink of choice and increasing purposeful exercise. Body mass index is 40.34 kg/m.       Post-menopausal    Trial of low dose topical estrogen to assist; reach back out regarding next refill/dosing options F/u with GYN as needed      Return in about 6 months (around 02/24/2024) for annual examination.     Leilani Merl, FNP, have reviewed all documentation for this visit. The documentation on 08/26/23 for the exam, diagnosis, procedures, and orders are all accurate and  complete.  [name], FNP  [facility] [phone] (phone) [phone] (fax)  [facility]

## 2023-08-26 NOTE — Assessment & Plan Note (Signed)
Chronic, stable Unsure if she wants to titrate mounjaro from 12.5 mg to 15 mg Continue to recommend balanced, lower carb meals. Smaller meal size, adding snacks. Choosing water as drink of choice and increasing purposeful exercise. A1c at goal

## 2023-08-26 NOTE — Assessment & Plan Note (Signed)
Chronic, improved Continue to recommend balanced, lower carb meals. Smaller meal size, adding snacks. Choosing water as drink of choice and increasing purposeful exercise. Body mass index is 40.34 kg/m.

## 2023-08-26 NOTE — Assessment & Plan Note (Signed)
Chronic, borderline Goal of 119/79 Continue farxiga 10

## 2023-09-22 ENCOUNTER — Other Ambulatory Visit: Payer: Self-pay

## 2023-09-22 ENCOUNTER — Other Ambulatory Visit (HOSPITAL_COMMUNITY): Payer: Self-pay

## 2023-09-22 NOTE — Progress Notes (Signed)
Specialty Pharmacy Refill Coordination Note  Cassandra Munoz is a 62 y.o. female contacted today regarding refills of specialty medication(s) Bimekizumab-Bkzx   Patient requested Delivery   Delivery date: 10/02/23   Verified address: [address] [place]   Medication will be filled on 10/01/23 for 10/09/23 injection.

## 2023-10-01 ENCOUNTER — Other Ambulatory Visit: Payer: Self-pay

## 2023-10-30 ENCOUNTER — Other Ambulatory Visit: Payer: Self-pay | Admitting: Physician Assistant

## 2023-10-30 DIAGNOSIS — E119 Type 2 diabetes mellitus without complications: Secondary | ICD-10-CM

## 2023-11-01 ENCOUNTER — Other Ambulatory Visit: Payer: Self-pay | Admitting: Physician Assistant

## 2023-11-01 ENCOUNTER — Other Ambulatory Visit: Payer: Self-pay

## 2023-11-01 DIAGNOSIS — E119 Type 2 diabetes mellitus without complications: Secondary | ICD-10-CM

## 2023-11-03 ENCOUNTER — Other Ambulatory Visit: Payer: Self-pay | Admitting: Family Medicine

## 2023-11-03 ENCOUNTER — Other Ambulatory Visit: Payer: Self-pay

## 2023-11-03 DIAGNOSIS — E119 Type 2 diabetes mellitus without complications: Secondary | ICD-10-CM

## 2023-11-03 MED FILL — Dapagliflozin Propanediol Tab 10 MG (Base Equivalent): ORAL | 90 days supply | Qty: 90 | Fill #0 | Status: AC

## 2023-11-03 NOTE — Telephone Encounter (Signed)
 Requested Prescriptions  Pending Prescriptions Disp Refills   FARXIGA  10 MG TABS tablet [Pharmacy Med Name: dapagliflozin  propanediol (FARXIGA ) 10 MG Tab tablet] 90 tablet 0    Sig: Take 1 tablet (10 mg total) by mouth every morning.     Endocrinology:  Diabetes - SGLT2 Inhibitors Passed - 11/03/2023  6:25 PM      Passed - Cr in normal range and within 360 days    Creat  Date Value Ref Range Status  11/23/2017 0.76 0.50 - 1.05 mg/dL Final    Comment:    For patients >87 years of age, the reference limit for Creatinine is approximately 13% higher for people identified as African-American. .    Creatinine, Ser  Date Value Ref Range Status  02/04/2023 0.95 0.57 - 1.00 mg/dL Final   Creatinine,U  Date Value Ref Range Status  11/23/2020 80.5 mg/dL Final         Passed - HBA1C is between 0 and 7.9 and within 180 days    Hemoglobin A1C  Date Value Ref Range Status  08/26/2023 6.0 (A) 4.0 - 5.6 % Final  11/18/2018 6.6  Final   HB A1C (BAYER DCA - WAIVED)  Date Value Ref Range Status  04/10/2022 6.7 (H) 4.8 - 5.6 % Final    Comment:             Prediabetes: 5.7 - 6.4          Diabetes: >6.4          Glycemic control for adults with diabetes: <7.0    Hgb A1c MFr Bld  Date Value Ref Range Status  02/04/2023 7.1 (H) 4.8 - 5.6 % Final    Comment:             Prediabetes: 5.7 - 6.4          Diabetes: >6.4          Glycemic control for adults with diabetes: <7.0          Passed - eGFR in normal range and within 360 days    GFR, Est African American  Date Value Ref Range Status  11/23/2017 102 > OR = 60 mL/min/1.56m2 Final   GFR calc Af Amer  Date Value Ref Range Status  11/22/2018 >60 >60 mL/min Final   GFR, Est Non African American  Date Value Ref Range Status  11/23/2017 88 > OR = 60 mL/min/1.68m2 Final   GFR, Estimated  Date Value Ref Range Status  08/01/2022 >60 >60 mL/min Final    Comment:    (NOTE) Calculated using the CKD-EPI Creatinine Equation (2021)     GFR  Date Value Ref Range Status  11/23/2020 75.98 >60.00 mL/min Final    Comment:    Calculated using the CKD-EPI Creatinine Equation (2021)   eGFR  Date Value Ref Range Status  02/04/2023 68 >59 mL/min/1.73 Final         Passed - Valid encounter within last 6 months    Recent Outpatient Visits           2 months ago Type 2 diabetes mellitus with hyperglycemia, with long-term current use of insulin  [facility])   [place] [facility] Emilio Marseille T, FNP   5 months ago Type 2 diabetes mellitus with hyperglycemia, with long-term current use of insulin  [facility])   [facility] Emilio Marseille DASEN, FNP   9 months ago Morbid obesity [facility])   [facility] Emilio Marseille DASEN, [place]  10 months ago Encounter for medication review   [facility] [place] Fleeta Tonia Senior L, RPH-CPP   11 months ago Hypertension associated with diabetes [facility])   [place] [facility] Emilio Kelly DASEN, FNP       Future Appointments             In 3 months Pardue, Lauraine SAILOR, DO [place] [facility], [facility]

## 2023-11-03 NOTE — Telephone Encounter (Signed)
 Refilled on 11/03/23, will refuse this duplicate request.  Requested Prescriptions  Pending Prescriptions Disp Refills   FARXIGA  10 MG TABS tablet [Pharmacy Med Name: dapagliflozin  propanediol (FARXIGA ) 10 MG Tab tablet] 90 tablet 0    Sig: Take 1 tablet (10 mg total) by mouth every morning.     Endocrinology:  Diabetes - SGLT2 Inhibitors Passed - 11/03/2023  6:26 PM      Passed - Cr in normal range and within 360 days    Creat  Date Value Ref Range Status  11/23/2017 0.76 0.50 - 1.05 mg/dL Final    Comment:    For patients >64 years of age, the reference limit for Creatinine is approximately 13% higher for people identified as African-American. .    Creatinine, Ser  Date Value Ref Range Status  02/04/2023 0.95 0.57 - 1.00 mg/dL Final   Creatinine,U  Date Value Ref Range Status  11/23/2020 80.5 mg/dL Final         Passed - HBA1C is between 0 and 7.9 and within 180 days    Hemoglobin A1C  Date Value Ref Range Status  08/26/2023 6.0 (A) 4.0 - 5.6 % Final  11/18/2018 6.6  Final   HB A1C (BAYER DCA - WAIVED)  Date Value Ref Range Status  04/10/2022 6.7 (H) 4.8 - 5.6 % Final    Comment:             Prediabetes: 5.7 - 6.4          Diabetes: >6.4          Glycemic control for adults with diabetes: <7.0    Hgb A1c MFr Bld  Date Value Ref Range Status  02/04/2023 7.1 (H) 4.8 - 5.6 % Final    Comment:             Prediabetes: 5.7 - 6.4          Diabetes: >6.4          Glycemic control for adults with diabetes: <7.0          Passed - eGFR in normal range and within 360 days    GFR, Est African American  Date Value Ref Range Status  11/23/2017 102 > OR = 60 mL/min/1.91m2 Final   GFR calc Af Amer  Date Value Ref Range Status  11/22/2018 >60 >60 mL/min Final   GFR, Est Non African American  Date Value Ref Range Status  11/23/2017 88 > OR = 60 mL/min/1.60m2 Final   GFR, Estimated  Date Value Ref Range Status  08/01/2022 >60 >60 mL/min Final    Comment:     (NOTE) Calculated using the CKD-EPI Creatinine Equation (2021)    GFR  Date Value Ref Range Status  11/23/2020 75.98 >60.00 mL/min Final    Comment:    Calculated using the CKD-EPI Creatinine Equation (2021)   eGFR  Date Value Ref Range Status  02/04/2023 68 >59 mL/min/1.73 Final         Passed - Valid encounter within last 6 months    Recent Outpatient Visits           2 months ago Type 2 diabetes mellitus with hyperglycemia, with long-term current use of insulin  [facility])   [place] [facility] Emilio Marseille T, FNP   5 months ago Type 2 diabetes mellitus with hyperglycemia, with long-term current use of insulin  [facility])   [facility] Emilio Marseille T, FNP   9 months ago Morbid obesity (HCC)  [facility] Emilio Marseille T, FNP   10 months ago Encounter for medication review   [facility] [place] Fleeta Tonia Senior L, RPH-CPP   11 months ago Hypertension associated with diabetes [facility])   [place] [facility] Emilio Marseille DASEN, FNP       Future Appointments             In 3 months Pardue, Lauraine SAILOR, DO  [facility], [facility]

## 2023-11-04 ENCOUNTER — Other Ambulatory Visit: Payer: Self-pay

## 2023-11-24 ENCOUNTER — Other Ambulatory Visit: Payer: Self-pay

## 2023-11-24 ENCOUNTER — Encounter (HOSPITAL_COMMUNITY): Payer: Self-pay

## 2023-11-24 NOTE — Progress Notes (Signed)
Specialty Pharmacy Ongoing Clinical Assessment Note  Cassandra Munoz is a 63 y.o. female who is being followed by the specialty pharmacy service for RxSp Psoriasis   Patient's specialty medication(s) reviewed today: Bimekizumab-bkzx (Bimzelx)   Missed doses in the last 4 weeks: 0   Patient/Caregiver did not have any additional questions or concerns.   Therapeutic benefit summary: Patient is achieving benefit   Adverse events/side effects summary: No adverse events/side effects   Patient's therapy is appropriate to: Continue    Goals Addressed             This Visit's Progress    Reduce signs and symptoms       Patient is on track. Patient will maintain adherence         Follow up:  6 months  Otto Herb Specialty Pharmacist

## 2023-11-24 NOTE — Progress Notes (Signed)
Specialty Pharmacy Refill Coordination Note  Cassandra Munoz is a 63 y.o. female contacted today regarding refills of specialty medication(s) Bimekizumab-bkzx (Bimzelx)   Patient requested Delivery   Delivery date: 12/01/23   Verified address: [address] [place]   Medication will be filled on 11/30/23.

## 2023-11-30 ENCOUNTER — Other Ambulatory Visit: Payer: Self-pay

## 2023-12-14 ENCOUNTER — Other Ambulatory Visit: Payer: Self-pay

## 2023-12-23 ENCOUNTER — Ambulatory Visit: Payer: 59 | Attending: Internal Medicine | Admitting: Pharmacist

## 2023-12-23 ENCOUNTER — Telehealth: Payer: Self-pay | Admitting: Pharmacist

## 2023-12-23 DIAGNOSIS — Z79899 Other long term (current) drug therapy: Secondary | ICD-10-CM

## 2023-12-23 NOTE — Progress Notes (Signed)
   S: Patient presents today for review of their specialty medication.   Patient is currently taking Bimzelx for psoriasis. Patient is managed by [facility] for this.   Dosing: 320 mg subcutaneous at weeks 0, 4, 8, 12, and 16 then once every 9 weeks thereafter.   Adherence: confirms.   Efficacy: reports good results. Experiences complete skin clearance after ~2 weeks on the medication.   TB testing: completed   Monitoring:  -LFTs: nl, slight blip in ALT of 59 but stable compared to previous. -S/sx of infection: none -GI distress: none   Current adverse effects: -Acne/folliculitis: none -Injection site reaction: none -HA: none -Fatigue: none  -Cough: none  O:     Lab Results  Component Value Date   WBC 7.2 02/04/2023   HGB 15.7 02/04/2023   HCT 47.1 (H) 02/04/2023   MCV 87 02/04/2023   PLT 248 02/04/2023      Chemistry      Component Value Date/Time   NA 140 02/04/2023 0929   K 5.1 02/04/2023 0929   CL 104 02/04/2023 0929   CO2 19 (L) 02/04/2023 0929   BUN 19 02/04/2023 0929   CREATININE 0.95 02/04/2023 0929   CREATININE 0.76 11/23/2017 1049      Component Value Date/Time   CALCIUM 9.5 02/04/2023 0929   ALKPHOS 61 02/04/2023 0929   AST 33 02/04/2023 0929   ALT 59 (H) 02/04/2023 0929   BILITOT 0.4 02/04/2023 0929       A/P: 1. Medication review: patient currently on Bimzelx for psoriasis. She is tolerating it well with good efficacy. Reviewed the medication with her. Bimekizumab-bkzx is a humanized immunoglobulin IgG1/kappa monoclonal antibody that selectively binds to IL-17A, IL-65F, and interleukin 17-AF cytokines, and inhibits their interaction with the IL-17 receptor complex. Common side effects include: injection site reactions, GI upset, cough, HA, and fatigue. Less common side effects include LFT elevation, IBD, and psychiatric changes. Patients should be monitored for S/sx of infection, GI distress. LFTs should be monitored periodically. Patient has no  questions at this time.   Administration/storage: Before injecting, remove the carton from the refrigerator and allow to reach room temperature (30 to 45 minutes). Do not remove the prefilled syringes or autoinjectors from the carton until ready for use to protect from light; do not shake.Prefilled syringes or autoinjectors may be stored at room temperature up to 77 degrees F (25 degrees C) for up to 30 days in the original carton. Inject each separate 160 mg single-dose prefilled syringe or autoinjector subQ at a different anatomic location (such as thighs, abdomen or back of upper arm). Do not inject within 2 inches (5 cm) of the navel or into areas where the skin is tender, bruised, red, hard, thick, scaly, or affected by psoriasis.   No recommendation for any changes at this time.

## 2023-12-23 NOTE — Telephone Encounter (Signed)
 Called patient to schedule an appointment for the [facility] [employer] Special[facility]ication [facility]. I was unable to reach the patient so I left a HIPAA-compliant message requesting that the patient return my call.   Butch Penny, PharmD, P[place]y [place], CPP Clinical Pharmacist [facility] [phone]

## 2024-01-19 ENCOUNTER — Other Ambulatory Visit (HOSPITAL_COMMUNITY): Payer: Self-pay

## 2024-01-20 ENCOUNTER — Other Ambulatory Visit: Payer: Self-pay

## 2024-01-20 NOTE — Progress Notes (Signed)
 Specialty Pharmacy Refill Coordination Note  Cassandra Munoz is a 63 y.o. female contacted today regarding refills of specialty medication(s) Bimekizumab-bkzx (Bimzelx)   Patient requested (Patient-Rptd) Delivery   Delivery date: (Patient-Rptd) 01/22/24   Verified address: (Patient-Rptd) [address] [place], [place] [address]   Medication will be filled on 01/21/24.

## 2024-01-21 ENCOUNTER — Other Ambulatory Visit: Payer: Self-pay

## 2024-02-01 ENCOUNTER — Other Ambulatory Visit: Payer: Self-pay | Admitting: Physician Assistant

## 2024-02-01 DIAGNOSIS — E119 Type 2 diabetes mellitus without complications: Secondary | ICD-10-CM

## 2024-02-02 ENCOUNTER — Other Ambulatory Visit: Payer: Self-pay

## 2024-02-02 ENCOUNTER — Other Ambulatory Visit: Payer: Self-pay | Admitting: Family Medicine

## 2024-02-02 DIAGNOSIS — E119 Type 2 diabetes mellitus without complications: Secondary | ICD-10-CM

## 2024-02-03 ENCOUNTER — Other Ambulatory Visit: Payer: Self-pay

## 2024-02-03 MED FILL — Dapagliflozin Propanediol Tab 10 MG (Base Equivalent): ORAL | 90 days supply | Qty: 90 | Fill #0 | Status: AC

## 2024-02-04 DIAGNOSIS — D2261 Melanocytic nevi of right upper limb, including shoulder: Secondary | ICD-10-CM | POA: Diagnosis not present

## 2024-02-04 DIAGNOSIS — L708 Other acne: Secondary | ICD-10-CM | POA: Diagnosis not present

## 2024-02-04 DIAGNOSIS — D225 Melanocytic nevi of trunk: Secondary | ICD-10-CM | POA: Diagnosis not present

## 2024-02-04 DIAGNOSIS — D2262 Melanocytic nevi of left upper limb, including shoulder: Secondary | ICD-10-CM | POA: Diagnosis not present

## 2024-02-04 DIAGNOSIS — D2272 Melanocytic nevi of left lower limb, including hip: Secondary | ICD-10-CM | POA: Diagnosis not present

## 2024-02-04 DIAGNOSIS — D2271 Melanocytic nevi of right lower limb, including hip: Secondary | ICD-10-CM | POA: Diagnosis not present

## 2024-02-04 DIAGNOSIS — L400 Psoriasis vulgaris: Secondary | ICD-10-CM | POA: Diagnosis not present

## 2024-02-04 DIAGNOSIS — L821 Other seborrheic keratosis: Secondary | ICD-10-CM | POA: Diagnosis not present

## 2024-02-24 ENCOUNTER — Ambulatory Visit (INDEPENDENT_AMBULATORY_CARE_PROVIDER_SITE_OTHER): Payer: Self-pay | Admitting: Family Medicine

## 2024-02-24 ENCOUNTER — Other Ambulatory Visit: Payer: Self-pay

## 2024-02-24 ENCOUNTER — Encounter: Payer: 59 | Admitting: Family Medicine

## 2024-02-24 ENCOUNTER — Encounter: Payer: Self-pay | Admitting: Family Medicine

## 2024-02-24 VITALS — BP 114/68 | HR 77 | Ht 65.0 in | Wt 237.0 lb

## 2024-02-24 DIAGNOSIS — E559 Vitamin D deficiency, unspecified: Secondary | ICD-10-CM

## 2024-02-24 DIAGNOSIS — E785 Hyperlipidemia, unspecified: Secondary | ICD-10-CM

## 2024-02-24 DIAGNOSIS — I152 Hypertension secondary to endocrine disorders: Secondary | ICD-10-CM | POA: Diagnosis not present

## 2024-02-24 DIAGNOSIS — Z0001 Encounter for general adult medical examination with abnormal findings: Secondary | ICD-10-CM

## 2024-02-24 DIAGNOSIS — E1169 Type 2 diabetes mellitus with other specified complication: Secondary | ICD-10-CM

## 2024-02-24 DIAGNOSIS — Z7985 Long-term (current) use of injectable non-insulin antidiabetic drugs: Secondary | ICD-10-CM | POA: Diagnosis not present

## 2024-02-24 DIAGNOSIS — Z23 Encounter for immunization: Secondary | ICD-10-CM | POA: Diagnosis not present

## 2024-02-24 DIAGNOSIS — E1159 Type 2 diabetes mellitus with other circulatory complications: Secondary | ICD-10-CM | POA: Diagnosis not present

## 2024-02-24 MED ORDER — EZETIMIBE 10 MG PO TABS
10.0000 mg | ORAL_TABLET | Freq: Every day | ORAL | 2 refills | Status: AC
  Filled 2024-02-24 – 2024-04-12 (×3): qty 90, 90d supply, fill #0
  Filled 2024-07-04: qty 90, 90d supply, fill #1
  Filled 2024-10-03: qty 90, 90d supply, fill #2

## 2024-02-24 MED ORDER — TIRZEPATIDE 15 MG/0.5ML ~~LOC~~ SOAJ
15.0000 mg | SUBCUTANEOUS | 3 refills | Status: AC
Start: 2024-02-24 — End: ?
  Filled 2024-02-24: qty 6, 84d supply, fill #0
  Filled 2024-03-14: qty 2, 28d supply, fill #0
  Filled 2024-04-12: qty 2, 28d supply, fill #1
  Filled 2024-05-09: qty 2, 28d supply, fill #2
  Filled 2024-06-07: qty 2, 28d supply, fill #3
  Filled 2024-07-02: qty 2, 28d supply, fill #4
  Filled 2024-07-29: qty 2, 28d supply, fill #5
  Filled 2024-08-29: qty 2, 28d supply, fill #6
  Filled 2024-09-26: qty 2, 28d supply, fill #7
  Filled 2024-10-20: qty 2, 28d supply, fill #8
  Filled 2024-11-22: qty 2, 28d supply, fill #9

## 2024-02-24 NOTE — Progress Notes (Signed)
 Complete physical exam   Patient: Cassandra Munoz   DOB: 1961-10-27   63 y.o. Female  MRN: [id number] Visit Date: 02/24/2024  Today's healthcare provider: Carlean Charter, DO   Chief Complaint  Patient presents with   Annual Exam    Diet -  General, well balanced Exercise - three to five days a week for at least 2 miles possibly longer at times Feeling - well Sleeping - well Concerns -  none   Care Management    Cervical Cancer Screening - hysterectomy in 2016 Foot Exam - Due  Tetanus Vaccine - declined Pneumococcal Vaccine- declined Zoster Vaccine -declined Ophthalmology Exam - July 2024 at [facility]   Subjective    Cassandra Munoz is a 63 y.o. female who presents today for a complete physical exam.   HPI HPI     Annual Exam    Additional comments: Diet -  General, well balanced Exercise - three to five days a week for at least 2 miles possibly longer at times Feeling - well Sleeping - well Concerns -  none        Care Management    Additional comments: Cervical Cancer Screening - hysterectomy in 2016 Foot Exam - Due  Tetanus Vaccine - declined Pneumococcal Vaccine- declined Zoster Vaccine -declined Ophthalmology Exam - July 2024 at [facility]      Last edited by Pasty Bongo, CMA on 02/24/2024  9:54 AM.       Cassandra Munoz is a 63 year old female who presents for an annual physical exam.  She has no specific concerns today but is generally opposed to receiving the pneumonia vaccine. She is considering the shingles vaccine but is apprehensive due to potential reactions she has observed in others. She inquired about the potential impact of the shingles vaccine on her previous Bell's palsy and autoimmune status, expressing concern about the possibility of experiencing Bell's palsy again.  She has experienced slight headaches over the last few weeks, which she attributes to allergies. 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 She uses azelastine  nasal spray intermittently for allergy  symptoms but is not consistent with its use.   Her current medications include Farxiga , ezetimibe , and Mounjaro . She previously used Zofran  for nausea related to Ozempic  dosage increases but has not used it recently. She also uses a cream for psoriasis as needed, but its use has decreased since starting Bimzelx .  She has lost approximately 25 pounds over the last several months and feels her weight has plateaued. She engages in regular physical activity, including walking and work-related activities, and has a history of monitoring her diet through methods like Weight Watchers.   Pap done at [facility] on 08/03/2023 - NILM, HPV negative    Past Medical History:  Diagnosis Date   Atypical mole 02/08/2020   Left forearm. Melanocytic proliferation of uncertain malignant potential, base involved.   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   Psoriasis    Sciatic pain 2015   Vitamin D  deficiency    Past Surgical History:  Procedure Laterality Date   ABDOMINAL HYSTERECTOMY N/A 05/07/2015   Procedure: HYSTERECTOMY ABDOMINAL;  Surgeon: Colan Dash, MD;  Location: [facility] ORS;  Service: Gynecology;  Laterality: N/A;   ANKLE ARTHROSCOPY Right  BREAST BIOPSY Right 07/14/2022   us  biopsy/ ribbon clip/ path pending   BREAST BIOPSY WITH RADIO FREQUENCY LOCALIZER Right 08/20/2022   Procedure: BREAST BIOPSY WITH RADIO FREQUENCY LOCALIZER;  Surgeon: Eldred Grego, MD;  Location: [facility] ORS;  Service: General;  Laterality: Right;   COLONOSCOPY WITH PROPOFOL  N/A 04/03/2016   Procedure: COLONOSCOPY WITH PROPOFOL ;  Surgeon: Cassie Click, MD;  Location: [facility]COPY;  Service: Endoscopy;  Laterality: N/A;   COLONOSCOPY WITH  PROPOFOL  N/A 06/24/2023   Procedure: COLONOSCOPY WITH PROPOFOL ;  Surgeon: Toledo, Alphonsus Jeans, MD;  Location: [facility] ENDOSCOPY;  Service: Gastroenterology;  Laterality: N/A;   ROBOTIC ASSISTED LAPAROSCOPIC VAGINAL HYSTERECTOMY WITH FIBROID REMOVAL     Fibroid removal   TONSILLECTOMY AND ADENOIDECTOMY     as a chaild   Social History   Socioeconomic History   Marital status: Married    Spouse name: Not on file   Number of children: Not on file   Years of education: Not on file   Highest education level: Master's degree (e.g., MA, MS, MEng, MEd, MSW, MBA)  Occupational History   Not on file  Tobacco Use   Smoking status: Never   Smokeless tobacco: Never  Vaping Use   Vaping status: Never Used  Substance and Sexual Activity   Alcohol use: No    Alcohol/week: 0.0 standard drinks of alcohol    Comment: maybe 1 drink/month - socially   Drug use: No   Sexual activity: Yes    Birth control/protection: None  Other Topics Concern   Not on file  Social History Narrative   [facility]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   Occasional alcohol use   1 miscarriage   Last PAP: 2018 (Normal [place])   Mammogram screening: 2013 (Normal/[place])      Social Drivers of Health   Financial Resource Strain: Low Risk  (08/25/2023)   Overall Financial Resource Strain (CARDIA)    Difficulty of Paying Living Expenses: Not hard at all  Food Insecurity: No Food Insecurity (08/25/2023)   Hunger Vital Sign    Worried About Running Out of Food in the Last Year: Never true    Ran Out of Food in the Last Year: Never true  Transportation Needs: No Transportation Needs (08/25/2023)   PRAPARE - Administrator, Civil Service (Medical): No    Lack of Transportation (Non-Medical): No  Physical Activity: Sufficiently Active (08/25/2023)   Exercise Vital Sign    Days of Exercise per Week: 3 days    Minutes of Exercise per Session: 130 min  Stress: No Stress Concern Present (08/25/2023)   [employer] of Occupational Health  - Occupational Stress Questionnaire    Feeling of Stress : Not at all  Social Connections: Unknown (08/25/2023)   Social Connection and Isolation Panel [NHANES]    Frequency of Communication with Friends and Family: More than three times a week    Frequency of Social Gatherings with Friends and Family: Patient declined    Attends Religious Services: Patient declined    Database administrator or Organizations: Patient declined    Attends Engineer, structural: Not on file    Marital Status: Married  Catering manager Violence: Not on file   Family Status  Relation Name Status   Mother  Deceased   Father  Deceased   Sister  Alive   Brother  Alive   Brother  Alive   Oceanographer  Alive   Pat Aunt  Alive   [employer]  Alive   Cousin paternal Deceased   Cousin paternal Deceased   Cousin paternal Alive  No partnership data on file   Family History  Problem Relation Age of Onset   Diabetes Mother    Hypertension Mother    Colon cancer Father 90   Heart disease Father    Hypertension Father    Cancer Father    Prostate cancer Father        dx 82s   Colon cancer Sister    Hypertension Brother    Hyperlipidemia Brother    Diabetes Brother    Bladder Cancer Brother        dx 39s   Heart failure Brother        had pace maker put in   Breast cancer Paternal Aunt        dx 12s   Breast cancer Paternal Aunt        dx 22s   Breast cancer Paternal Aunt    Cancer Cousin        either breast or colon   Cancer Cousin        unk type   Breast cancer Cousin    Allergies  Allergen Reactions   Clobetasol Rash    Ointment used for psorasis    Patient Care Team: Carlean Charter, DO as PCP - General (Family Medicine) Emilie Harden, MD as Consulting Physician (Endocrinology)   Medications: Outpatient Medications Prior to Visit  Medication Sig   Azelastine  HCl 137 MCG/SPRAY SOLN Place 1-2 sprays into both nostrils every 12 (twelve) hours as needed for drainage.    bimekizumab -bkzx (BIMZELX ) 160 MG/ML pen Inject 320mg  subcutaneously every 8 weeks ( maintenance dose )   dapagliflozin  propanediol (FARXIGA ) 10 MG TABS tablet Take 1 tablet (10 mg total) by mouth every morning.   Lancets (FREESTYLE) lancets Use to check blood sugar 2 times a day.   neomycin -polymyxin b-dexamethasone  (MAXITROL ) 3.5-10000-0.1 OINT Apply a small amount on eyelid 4 (four) times a day (Patient taking differently: Taking as needed)   nystatin -triamcinolone  (MYCOLOG II) cream Apply 1 application topically 2 (two) times daily. Use for 10-14 days   pramoxine (SARNA SENSITIVE) 1 % LOTN Apply to affected areas once or twice daily as needed   triamcinolone  (KENALOG ) 0.147 MG/GM topical spray Apply topically once or twice daily until clear then as needed   [DISCONTINUED] ezetimibe  (ZETIA ) 10 MG tablet Take 1 tablet (10 mg total) by mouth daily.   [DISCONTINUED] ondansetron  (ZOFRAN ) 8 MG tablet Take 1 tablet (8 mg total) by mouth every 8 (eight) hours as needed for nausea or vomiting.   [DISCONTINUED] tirzepatide  (MOUNJARO ) 12.5 MG/0.5ML Pen Inject 12.5 mg into the skin once a week.   estradiol  (CLIMARA  - DOSED IN MG/24 HR) 0.025 mg/24hr patch Place 1 (one) patch onto the skin once a week. (Patient not taking: Reported on 02/24/2024)   No facility-administered medications prior to visit.    Review of Systems  Constitutional:  Negative for chills, fatigue and fever.  HENT:  Negative for congestion, ear pain, rhinorrhea, sneezing and sore throat.   Eyes: Negative.  Negative for pain and redness.  Respiratory:  Negative for cough, shortness of breath and wheezing.   Cardiovascular:  Negative for chest pain and leg swelling.  Gastrointestinal:  Negative for abdominal pain, blood in stool, constipation, diarrhea and nausea.  Endocrine: Negative for polydipsia and polyphagia.  Genitourinary: Negative.  Negative for dysuria, flank pain, hematuria, pelvic pain, vaginal bleeding and vaginal  discharge.  Musculoskeletal:  Negative for arthralgias, back pain, gait problem and joint swelling.  Skin:  Negative for rash.  Neurological:  Positive for headaches (slight intermittent right sided headache). Negative for dizziness, tremors, seizures, weakness, light-headedness and numbness.  Hematological:  Negative for adenopathy.  Psychiatric/Behavioral: Negative.  Negative for behavioral problems, confusion and dysphoric mood. The patient is not nervous/anxious and is not hyperactive.       Objective    BP 114/68 (BP Location: Right Arm, Patient Position: Sitting, Cuff Size: Large)   Pulse 77   Ht 5\' 5"  (1.651 m)   Wt 237 lb (107.5 kg)   LMP 05/01/2013   SpO2 98%   BMI 39.44 kg/m    Physical Exam Vitals and nursing note reviewed.  Constitutional:      General: She is awake.     Appearance: Normal appearance.  HENT:     Head: Normocephalic and atraumatic.     Right Ear: Tympanic membrane, ear canal and external ear normal.     Left Ear: Tympanic membrane, ear canal and external ear normal.     Nose: Nose normal.     Mouth/Throat:     Mouth: Mucous membranes are moist.     Pharynx: Oropharynx is clear. No oropharyngeal exudate or posterior oropharyngeal erythema.  Eyes:     General: No scleral icterus.    Extraocular Movements: Extraocular movements intact.     Conjunctiva/sclera: Conjunctivae normal.     Pupils: Pupils are equal, round, and reactive to light.  Neck:     Thyroid : No thyromegaly or thyroid  tenderness.  Cardiovascular:     Rate and Rhythm: Normal rate and regular rhythm.     Pulses: Normal pulses.          Dorsalis pedis pulses are 2+ on the right side and 2+ on the left side.       Posterior tibial pulses are 2+ on the right side and 2+ on the left side.     Heart sounds: Normal heart sounds.  Pulmonary:     Effort: Pulmonary effort is normal. No tachypnea, bradypnea or respiratory distress.     Breath sounds: Normal breath sounds. No stridor. No  wheezing, rhonchi or rales.  Abdominal:     General: Bowel sounds are normal. There is no distension.     Palpations: Abdomen is soft. There is no mass.     Tenderness: There is no abdominal tenderness. There is no guarding.     Hernia: No hernia is present.  Musculoskeletal:     Cervical back: Normal range of motion and neck supple.     Right lower leg: No edema.     Left lower leg: No edema.     Right foot: Normal range of motion. No deformity, bunion, Charcot foot, foot drop or prominent metatarsal heads.     Left foot: Normal range of motion. No deformity, bunion, Charcot foot, foot drop or prominent metatarsal heads.  Feet:     Right foot:     Protective Sensation: 10 sites tested.  10 sites sensed.     Skin integrity: No ulcer, blister, skin breakdown, erythema, warmth, callus, dry skin or fissure.     Toenail Condition: Right toenails are normal.     Left foot:     Protective Sensation: 10 sites tested.  10 sites sensed.     Skin integrity: No ulcer, blister, skin breakdown, erythema, warmth, callus, dry skin or fissure.     Toenail Condition: Left toenails are normal.  Lymphadenopathy:  Cervical: No cervical adenopathy.  Skin:    General: Skin is warm and dry.  Neurological:     Mental Status: She is alert and oriented to person, place, and time. Mental status is at baseline.  Psychiatric:        Mood and Affect: Mood normal.        Behavior: Behavior normal.      Last depression screening scores    02/24/2024    9:50 AM 02/04/2023    9:07 AM 11/26/2022    9:48 AM  PHQ 2/9 Scores  PHQ - 2 Score 0 0 0  PHQ- 9 Score  0 0   Last fall risk screening    02/24/2024    9:50 AM  Fall Risk   Falls in the past year? 0  Number falls in past yr: 0  Injury with Fall? 0  Risk for fall due to : No Fall Risks  Follow up Falls evaluation completed   Last Audit-C alcohol use screening    08/25/2023    8:50 PM  Alcohol Use Disorder Test (AUDIT)  1. How often do you have  a drink containing alcohol? 0  3. How often do you have six or more drinks on one occasion? 0   A score of 3 or more in women, and 4 or more in men indicates increased risk for alcohol abuse, EXCEPT if all of the points are from question 1   No results found for any visits on 02/24/24.  Assessment & Plan    Routine Health Maintenance and Physical Exam  Exercise Activities and Dietary recommendations  Goals      Reduce signs and symptoms     Patient is on track. Patient will maintain adherence         Immunization History  Administered Date(s) Administered   Influenza,inj,Quad PF,6+ Mos 07/26/2019   Influenza-Unspecified 07/26/2019, 08/05/2021, 07/28/2022, 08/17/2023   PFIZER(Purple Top)SARS-COV-2 Vaccination 11/14/2019, 12/06/2019, 08/10/2020    Health Maintenance  Topic Date Due   Cervical Cancer Screening (HPV/Pap Cotest)  04/13/2023   HEMOGLOBIN A1C  11/26/2023   Diabetic kidney evaluation - Urine ACR  11/27/2023   Diabetic kidney evaluation - eGFR measurement  02/04/2024   COVID-19 Vaccine (4 - 2024-25 season) 07/27/2024 (Originally 06/28/2023)   DTaP/Tdap/Td (1 - Tdap) 02/23/2025 (Originally [birth year]28/1980)   Pneumococcal Vaccine 83-72 Years old (1 of 2 - PCV) 02/23/2025 (Originally [birth year]28/1980)   Zoster Vaccines- Shingrix (1 of 2) 02/23/2025 (Originally [birth year]28/1980)   OPHTHALMOLOGY EXAM  05/03/2024   INFLUENZA VACCINE  05/27/2024   FOOT EXAM  02/23/2025   MAMMOGRAM  07/01/2025   Colonoscopy  06/23/2033   Hepatitis C Screening  Completed   HIV Screening  Completed   HPV VACCINES  Aged Out   Meningococcal B Vaccine  Aged Out    Discussed health benefits of physical activity, and encouraged her to engage in regular exercise appropriate for her age and condition.   Need for shingles vaccine -     Varicella-zoster vaccine IM  Hypertension associated with diabetes (HCC) -     Comprehensive metabolic panel with GFR -     Lipid Panel With LDL/HDL Ratio -     Hemoglobin  A1c -     Microalbumin / creatinine urine ratio -     Tirzepatide ; Inject 15 mg into the skin once a week.  Dispense: 6 mL; Refill: 3  Hyperlipidemia associated with type 2 diabetes mellitus (HCC) -     Ezetimibe ; Take 1 tablet (  10 mg total) by mouth daily.  Dispense: 90 tablet; Refill: 2  Morbid obesity (HCC)  Vitamin D  deficiency -     VITAMIN D  25 Hydroxy (Vit-D Deficiency, Fractures)     Annual physical exam Routine visit. Physical exam overall unremarkable except as noted above. Routine lab work ordered as noted.  Discussed vaccinations. Hesitant about shingles vaccine due to past Bell's palsy. Tetanus vaccine likely overdue but declined. - Administered shingles vaccine. - Tetanus vaccine declined. - Abstract last year's Pap smear from [facility] for records.  Type 2 diabetes mellitus with associated hypertension Blood glucose well-controlled with Mounjaro  and Farxiga . Weight loss plateaued. Discussed potential discontinuation of Farxiga  if control maintained. - Increase Mounjaro  dosage to 15 mg. - Consider reducing dose for discontinuing Farxiga  if blood glucose remains well-controlled. - Refer to nutritionist for dietary guidance.  Hypertension Chronic, stable.  Managed with diet and exercise.  Allergic rhinitis Intermittent headaches and sneezing likely due to seasonal allergies. Inconsistent use of azelastine  spray. - Consider consistent use of loratadine or cetirizine during allergy  season. - Encourage consistent use of azelastine  spray.  Psoriasis Managed with Bemzelic. No current issues reported.  Defer to specialist management.  History of Bell's palsy No current symptoms. Discussed Shingrix vaccine not associated with Bell's palsy, though the virus itself and the previous live vaccine are. - Discuss potential risks and benefits of shingles vaccine in relation to Bell's palsy.  Morbid obesity Counseled patient on diet and exercise  Vitamin D   deficiency History of vitamin D  deficiency with borderline low level 1 year ago.  Will recheck today.    Return in about 6 months (around 08/25/2024) for DM.     I discussed the assessment and treatment plan with the patient  The patient was provided an opportunity to ask questions and all were answered. The patient agreed with the plan and demonstrated an understanding of the instructions.   The patient was advised to call back or seek an in-person evaluation if the symptoms worsen or if the condition fails to improve as anticipated.    Carlean Charter, DO  [facility] [phone] (phone) [phone] (fax)  [facility]

## 2024-02-25 LAB — COMPREHENSIVE METABOLIC PANEL WITH GFR
ALT: 30 IU/L (ref 0–32)
AST: 22 IU/L (ref 0–40)
Albumin: 4.5 g/dL (ref 3.9–4.9)
Alkaline Phosphatase: 58 IU/L (ref 44–121)
BUN/Creatinine Ratio: 28 (ref 12–28)
BUN: 22 mg/dL (ref 8–27)
Bilirubin Total: 0.5 mg/dL (ref 0.0–1.2)
CO2: 22 mmol/L (ref 20–29)
Calcium: 9.9 mg/dL (ref 8.7–10.3)
Chloride: 102 mmol/L (ref 96–106)
Creatinine, Ser: 0.8 mg/dL (ref 0.57–1.00)
Globulin, Total: 2.7 g/dL (ref 1.5–4.5)
Glucose: 98 mg/dL (ref 70–99)
Potassium: 4.7 mmol/L (ref 3.5–5.2)
Sodium: 139 mmol/L (ref 134–144)
Total Protein: 7.2 g/dL (ref 6.0–8.5)
eGFR: 83 mL/min/{1.73_m2} (ref >59)

## 2024-02-25 LAB — LIPID PANEL WITH LDL/HDL RATIO
Cholesterol, Total: 212 mg/dL — ABNORMAL HIGH (ref 100–199)
HDL: 53 mg/dL (ref >39)
LDL Chol Calc (NIH): 135 mg/dL — ABNORMAL HIGH (ref 0–99)
LDL/HDL Ratio: 2.5 ratio (ref 0.0–3.2)
Triglycerides: 136 mg/dL (ref 0–149)
VLDL Cholesterol Cal: 24 mg/dL (ref 5–40)

## 2024-02-25 LAB — MICROALBUMIN / CREATININE URINE RATIO
Creatinine, Urine: 106.4 mg/dL
Microalb/Creat Ratio: 7 mg/g{creat} (ref 0–29)
Microalbumin, Urine: 7.7 ug/mL

## 2024-02-25 LAB — HEMOGLOBIN A1C
Est. average glucose Bld gHb Est-mCnc: 128 mg/dL
Hgb A1c MFr Bld: 6.1 % — ABNORMAL HIGH (ref 4.8–5.6)

## 2024-02-25 LAB — VITAMIN D 25 HYDROXY (VIT D DEFICIENCY, FRACTURES): Vit D, 25-Hydroxy: 29.6 ng/mL — ABNORMAL LOW (ref 30.0–100.0)

## 2024-03-02 ENCOUNTER — Encounter: Payer: Self-pay | Admitting: Family Medicine

## 2024-03-03 ENCOUNTER — Encounter: Payer: Self-pay | Admitting: Family Medicine

## 2024-03-08 ENCOUNTER — Other Ambulatory Visit: Payer: Self-pay

## 2024-03-08 ENCOUNTER — Other Ambulatory Visit (HOSPITAL_COMMUNITY): Payer: Self-pay

## 2024-03-08 NOTE — Progress Notes (Signed)
 Specialty Pharmacy Refill Coordination Note  Cassandra Munoz is a 63 y.o. female contacted today regarding refills of specialty medication(s) Bimzelx .  Patient requested (Patient-Rptd) Delivery   Delivery date: (Patient-Rptd) 03/23/24   Verified address: (Patient-Rptd) [address]  [place], [place]   Medication will be filled on 03/22/24.   This fill date is pending response to refill request from provider. Patient is aware and if they have not received fill by intended date, they must follow up with pharmacy.  Rewrite required

## 2024-03-10 ENCOUNTER — Other Ambulatory Visit: Payer: Self-pay

## 2024-03-10 NOTE — Progress Notes (Signed)
 Clinical Intervention Note  Clinical Intervention Notes: Patient reports initiating Magnesium Glycinate. No DDIs identified with Bimzelx .   Clinical Intervention Outcomes: Prevention of an adverse drug event   Rena Carnes Specialty Pharmacist

## 2024-03-11 ENCOUNTER — Other Ambulatory Visit: Payer: Self-pay

## 2024-03-11 ENCOUNTER — Other Ambulatory Visit (HOSPITAL_COMMUNITY): Payer: Self-pay

## 2024-03-11 ENCOUNTER — Other Ambulatory Visit: Payer: Self-pay | Admitting: Pharmacist

## 2024-03-11 MED ORDER — BIMZELX 160 MG/ML ~~LOC~~ SOAJ
SUBCUTANEOUS | 5 refills | Status: AC
  Filled 2024-03-11: qty 2, 30d supply, fill #0
  Filled 2024-05-11: qty 2, 30d supply, fill #1
  Filled 2024-06-10 – 2024-07-04 (×2): qty 2, 30d supply, fill #2
  Filled 2024-08-23: qty 2, 30d supply, fill #3
  Filled 2024-09-21 – 2024-10-25 (×2): qty 2, 30d supply, fill #4
  Filled 2024-11-23: qty 2, 30d supply, fill #5

## 2024-03-11 MED ORDER — BIMZELX 160 MG/ML ~~LOC~~ SOAJ
SUBCUTANEOUS | 5 refills | Status: DC
  Filled 2024-03-11: qty 2, 28d supply, fill #0

## 2024-03-14 ENCOUNTER — Other Ambulatory Visit: Payer: Self-pay

## 2024-03-16 ENCOUNTER — Other Ambulatory Visit: Payer: Self-pay

## 2024-03-22 ENCOUNTER — Other Ambulatory Visit: Payer: Self-pay

## 2024-03-22 ENCOUNTER — Other Ambulatory Visit (HOSPITAL_COMMUNITY): Payer: Self-pay

## 2024-04-12 ENCOUNTER — Other Ambulatory Visit: Payer: Self-pay

## 2024-04-27 ENCOUNTER — Other Ambulatory Visit: Payer: Self-pay | Admitting: Family Medicine

## 2024-04-27 ENCOUNTER — Other Ambulatory Visit: Payer: Self-pay

## 2024-04-27 DIAGNOSIS — E119 Type 2 diabetes mellitus without complications: Secondary | ICD-10-CM

## 2024-04-28 ENCOUNTER — Other Ambulatory Visit: Payer: Self-pay | Admitting: Family Medicine

## 2024-04-28 ENCOUNTER — Other Ambulatory Visit: Payer: Self-pay

## 2024-04-28 DIAGNOSIS — E119 Type 2 diabetes mellitus without complications: Secondary | ICD-10-CM

## 2024-04-28 MED FILL — Dapagliflozin Propanediol Tab 10 MG (Base Equivalent): ORAL | 90 days supply | Qty: 90 | Fill #0 | Status: AC

## 2024-05-06 ENCOUNTER — Other Ambulatory Visit: Payer: Self-pay

## 2024-05-11 ENCOUNTER — Other Ambulatory Visit: Payer: Self-pay

## 2024-05-11 NOTE — Progress Notes (Signed)
 Specialty Pharmacy Refill Coordination Note  Cassandra Munoz is a 63 y.o. female contacted today regarding refills of specialty medication(s) Bimekizumab -bkzx (Bimzelx )   Patient requested Delivery   Delivery date: 05/19/24   Verified address: [address], [place]   Medication will be filled on 05/18/24.

## 2024-05-18 ENCOUNTER — Other Ambulatory Visit: Payer: Self-pay

## 2024-06-08 ENCOUNTER — Other Ambulatory Visit: Payer: Self-pay

## 2024-06-08 LAB — OPHTHALMOLOGY REPORT-SCANNED

## 2024-06-08 MED ORDER — NEOMYCIN-POLYMYXIN-DEXAMETH 3.5-10000-0.1 OP SUSP
1.0000 [drp] | Freq: Three times a day (TID) | OPHTHALMIC | 0 refills | Status: AC
  Filled 2024-06-08 (×2): qty 5, 14d supply, fill #0

## 2024-06-10 ENCOUNTER — Other Ambulatory Visit: Payer: Self-pay | Admitting: Pharmacy Technician

## 2024-06-10 ENCOUNTER — Other Ambulatory Visit: Payer: Self-pay

## 2024-06-10 NOTE — Progress Notes (Signed)
 Open in error

## 2024-07-02 ENCOUNTER — Other Ambulatory Visit: Payer: Self-pay | Admitting: Family Medicine

## 2024-07-02 DIAGNOSIS — E119 Type 2 diabetes mellitus without complications: Secondary | ICD-10-CM

## 2024-07-03 ENCOUNTER — Other Ambulatory Visit: Payer: Self-pay

## 2024-07-04 ENCOUNTER — Other Ambulatory Visit: Payer: Self-pay

## 2024-07-04 ENCOUNTER — Encounter (INDEPENDENT_AMBULATORY_CARE_PROVIDER_SITE_OTHER): Payer: Self-pay

## 2024-07-04 ENCOUNTER — Other Ambulatory Visit (HOSPITAL_COMMUNITY): Payer: Self-pay

## 2024-07-04 ENCOUNTER — Other Ambulatory Visit: Payer: Self-pay | Admitting: Family Medicine

## 2024-07-04 DIAGNOSIS — E119 Type 2 diabetes mellitus without complications: Secondary | ICD-10-CM

## 2024-07-04 NOTE — Progress Notes (Signed)
 Specialty Pharmacy Refill Coordination Note  MyChart Questionnaire Submission  Cassandra Munoz is a 63 y.o. female contacted today regarding refills of specialty medication(s) Bimzelx .  Doses on hand: (Patient-Rptd) None   Injection date: (Patient-Rptd) 07/15/24  Patient requested: (Patient-Rptd) Delivery   Delivery date: 07/06/24  Verified address: [address] [place] [address]  Medication will be filled on 07/05/24.

## 2024-07-04 NOTE — Telephone Encounter (Signed)
 LOV 02/24/24 NOV 08/19/24 LRF 04/28/24 LABS 02/24/24

## 2024-07-04 NOTE — Progress Notes (Signed)
 Clinical Intervention Note  Clinical Intervention Notes: Patient reported starting magnesium gummies. No DDIs identified with Bimzelx    Clinical Intervention Outcomes: Prevention of an adverse drug event   Advertising account planner

## 2024-07-05 ENCOUNTER — Other Ambulatory Visit (HOSPITAL_COMMUNITY): Payer: Self-pay

## 2024-07-05 ENCOUNTER — Other Ambulatory Visit: Payer: Self-pay

## 2024-07-06 ENCOUNTER — Other Ambulatory Visit (HOSPITAL_COMMUNITY): Payer: Self-pay

## 2024-07-06 ENCOUNTER — Other Ambulatory Visit: Payer: Self-pay

## 2024-07-07 ENCOUNTER — Other Ambulatory Visit: Payer: Self-pay

## 2024-07-08 ENCOUNTER — Other Ambulatory Visit: Payer: Self-pay

## 2024-07-08 MED FILL — Dapagliflozin Propanediol Tab 10 MG (Base Equivalent): ORAL | 90 days supply | Qty: 90 | Fill #0 | Status: CN

## 2024-07-09 ENCOUNTER — Other Ambulatory Visit: Payer: Self-pay

## 2024-07-09 MED FILL — Dapagliflozin Propanediol Tab 10 MG (Base Equivalent): ORAL | 90 days supply | Qty: 90 | Fill #0 | Status: AC

## 2024-07-11 ENCOUNTER — Other Ambulatory Visit: Payer: Self-pay

## 2024-08-19 ENCOUNTER — Encounter: Payer: Self-pay | Admitting: Family Medicine

## 2024-08-19 ENCOUNTER — Other Ambulatory Visit: Payer: Self-pay

## 2024-08-19 ENCOUNTER — Ambulatory Visit: Admitting: Family Medicine

## 2024-08-19 VITALS — BP 133/72 | HR 77 | Temp 98.4°F | Ht 65.0 in | Wt 231.4 lb

## 2024-08-19 DIAGNOSIS — Z1231 Encounter for screening mammogram for malignant neoplasm of breast: Secondary | ICD-10-CM | POA: Diagnosis not present

## 2024-08-19 DIAGNOSIS — Z7984 Long term (current) use of oral hypoglycemic drugs: Secondary | ICD-10-CM

## 2024-08-19 DIAGNOSIS — E1165 Type 2 diabetes mellitus with hyperglycemia: Secondary | ICD-10-CM

## 2024-08-19 DIAGNOSIS — J302 Other seasonal allergic rhinitis: Secondary | ICD-10-CM | POA: Diagnosis not present

## 2024-08-19 DIAGNOSIS — Z1331 Encounter for screening for depression: Secondary | ICD-10-CM | POA: Diagnosis not present

## 2024-08-19 DIAGNOSIS — Z01419 Encounter for gynecological examination (general) (routine) without abnormal findings: Secondary | ICD-10-CM | POA: Diagnosis not present

## 2024-08-19 MED ORDER — AZELASTINE HCL 137 MCG/SPRAY NA SOLN
1.0000 | Freq: Two times a day (BID) | NASAL | 12 refills | Status: AC | PRN
  Filled 2024-08-19: qty 30, 50d supply, fill #0

## 2024-08-19 NOTE — Progress Notes (Unsigned)
 Established patient visit   Patient: Cassandra Munoz   DOB: 11-21-1960   63 y.o. Female  MRN: [id number] Visit Date: 08/19/2024  Today's healthcare provider: LAURAINE LOISE BUOY, DO   Chief Complaint  Patient presents with   Diabetes    Patient is here for a follow up on diabetes.  Reports that she does monitor glucose at home, this morning it was 100.    Diabetic Eye Exam- July 2025 [facility] will send to receive information  Vaccines declined    Subjective    Diabetes Pertinent negatives for hypoglycemia include no dizziness. Pertinent negatives for diabetes include no chest pain, no fatigue and no weakness.   Last annual 02/24/2024   Cassandra Munoz is a 63 year old female with type 2 diabetes who presents for routine follow-up.  Her blood sugar levels have been stable, often lower than usual, with no episodes of hypoglycemia. She is currently on tirzepatide  15 mg, and her recent A1c was 6.4. The patient reports that her A1c increased, which she believes is due to increased sugar intake during a recent vacation, where she consumed sugary coffee drinks daily.  She is taking Farxiga , which was switched several years ago from Invokana  due to insurance coverage issues.  She uses azelastine  nasal spray for postnasal drip, which she uses intermittently based on symptom severity. She prefers this over systemic antihistamines like Zyrtec or Claritin.  She declines the use of statins and is currently on ezetimibe  for cholesterol management.  No nausea, vomiting, abdominal pain, chest pain, shortness of breath, dizziness, lightheadedness, acute vision changes, or bowel movement issues.    {History (Optional):23778}  Medications: Outpatient Medications Prior to Visit  Medication Sig   bimekizumab -bkzx (BIMZELX ) 160 MG/ML pen Inject 320mg  into the skin every 8 weeks ( maintenance dose )   dapagliflozin  propanediol (FARXIGA ) 10 MG TABS tablet Take 1 tablet (10 mg total) by  mouth every morning.   ezetimibe  (ZETIA ) 10 MG tablet Take 1 tablet (10 mg total) by mouth daily.   Lancets (FREESTYLE) lancets Use to check blood sugar 2 times a day.   neomycin -polymyxin b-dexamethasone  (MAXITROL ) 3.5-10000-0.1 OINT Apply a small amount on eyelid 4 (four) times a day   neomycin -polymyxin b-dexamethasone  (MAXITROL ) 3.5-10000-0.1 SUSP Place 1 drop into the right eye 3 (three) times daily.   nystatin -triamcinolone  (MYCOLOG II) cream Apply 1 application topically 2 (two) times daily. Use for 10-14 days   pramoxine (SARNA SENSITIVE) 1 % LOTN Apply to affected areas once or twice daily as needed   tirzepatide  (MOUNJARO ) 15 MG/0.5ML Pen Inject 15 mg into the skin once a week.   triamcinolone  (KENALOG ) 0.147 MG/GM topical spray Apply topically once or twice daily until clear then as needed   [DISCONTINUED] Azelastine  HCl 137 MCG/SPRAY SOLN Place 1-2 sprays into both nostrils every 12 (twelve) hours as needed for drainage.   [DISCONTINUED] estradiol  (CLIMARA  - DOSED IN MG/24 HR) 0.025 mg/24hr patch Place 1 (one) patch onto the skin once a week. (Patient not taking: Reported on 02/24/2024)   No facility-administered medications prior to visit.    Review of Systems  Constitutional:  Negative for appetite change, chills, fatigue and fever.  Respiratory:  Negative for chest tightness and shortness of breath.   Cardiovascular:  Negative for chest pain and palpitations.  Gastrointestinal:  Negative for abdominal pain, constipation, diarrhea, nausea and vomiting.  Neurological:  Negative for dizziness and weakness.    {Insert previous labs (optional):23779} {See past labs  Heme  Chem  Endocrine  Serology  Results Review (optional):1}   Objective    BP 133/72 (BP Location: Left Arm, Patient Position: Sitting)   Pulse 77   Temp 98.4 F (36.9 C) (Oral)   Ht 5' 5 (1.651 m)   Wt 231 lb 6.4 oz (105 kg)   LMP 05/01/2013   SpO2 100%   BMI 38.51 kg/m  {Insert last BP/Wt  (optional):23777}{See vitals history (optional):1}   Physical Exam Constitutional:      Appearance: Normal appearance.  HENT:     Head: Normocephalic and atraumatic.  Eyes:     General: No scleral icterus.    Extraocular Movements: Extraocular movements intact.     Conjunctiva/sclera: Conjunctivae normal.  Cardiovascular:     Rate and Rhythm: Normal rate and regular rhythm.     Pulses: Normal pulses.     Heart sounds: Normal heart sounds.  Pulmonary:     Effort: Pulmonary effort is normal. No respiratory distress.     Breath sounds: Normal breath sounds.  Abdominal:     General: Bowel sounds are normal. There is no distension.     Palpations: Abdomen is soft. There is no mass.     Tenderness: There is no abdominal tenderness. There is no guarding.  Musculoskeletal:     Right lower leg: No edema.     Left lower leg: No edema.  Skin:    General: Skin is warm and dry.  Neurological:     Mental Status: She is alert and oriented to person, place, and time. Mental status is at baseline.  Psychiatric:        Mood and Affect: Mood normal.        Behavior: Behavior normal.      No results found for any visits on 08/19/24.  Assessment & Plan    Type 2 diabetes mellitus with hyperglycemia, without long-term current use of insulin  (HCC) -     POCT glycosylated hemoglobin (Hb A1C)  Perennial allergic rhinitis with seasonal variation -     Azelastine  HCl; Place 1-2 sprays into both nostrils every 12 (twelve) hours as needed for drainage.  Dispense: 30 mL; Refill: 12    Type 2 diabetes mellitus with hyperglycemia, without long-term current use of insulin  A1c increased to 6.4% today due to recent dietary choices. Blood sugar generally well-controlled on current medications. - Continue tirzepatide  15 mg. - Continue ezetimibe . - Continue Farxiga  (dapagliflozin ). - Advised to avoid sugary drinks. - Request records for recent eye exam.  Perennial allergic rhinitis with seasonal  variation Intermittent symptoms managed with azelastine  nasal spray. Prefers azelastine  over systemic antihistamines. - Prescribe azelastine  nasal spray as needed.    Return in about 6 months (around 02/17/2025) for CPE, Chronic f/u.      I discussed the assessment and treatment plan with the patient  The patient was provided an opportunity to ask questions and all were answered. The patient agreed with the plan and demonstrated an understanding of the instructions.   The patient was advised to call back or seek an in-person evaluation if the symptoms worsen or if the condition fails to improve as anticipated.    LAURAINE LOISE BUOY, DO  [facility] [phone] (phone) [phone] (fax)  [facility]

## 2024-08-23 ENCOUNTER — Other Ambulatory Visit: Payer: Self-pay

## 2024-08-23 NOTE — Progress Notes (Signed)
 Specialty Pharmacy Ongoing Clinical Assessment Note  Cassandra Munoz is a 63 y.o. female who is being followed by the specialty pharmacy service for RxSp Psoriasis   Patient's specialty medication(s) reviewed today: Bimekizumab -bkzx (Bimzelx )   Missed doses in the last 4 weeks: 0   Patient/Caregiver did not have any additional questions or concerns.   Therapeutic benefit summary: Patient is achieving benefit   Adverse events/side effects summary: No adverse events/side effects   Patient's therapy is appropriate to: Continue    Goals Addressed             This Visit's Progress    Reduce signs and symptoms   On track    Patient is on track. Patient will maintain adherence. Patient reports she is well controlled at this time.         Follow up: 12 months  [facility]

## 2024-08-23 NOTE — Progress Notes (Signed)
 Specialty Pharmacy Refill Coordination Note  Cassandra Munoz is a 63 y.o. female contacted today regarding refills of specialty medication(s) Bimekizumab -bkzx (Bimzelx )   Patient requested Delivery   Delivery date: 08/30/24   Verified address: [address] [place] [address]   Medication will be filled on: 08/29/24

## 2024-08-29 ENCOUNTER — Other Ambulatory Visit: Payer: Self-pay

## 2024-08-30 LAB — POCT GLYCOSYLATED HEMOGLOBIN (HGB A1C): Hemoglobin A1C: 6.4 % — AB (ref 4.0–5.6)

## 2024-09-21 ENCOUNTER — Other Ambulatory Visit (HOSPITAL_COMMUNITY): Payer: Self-pay

## 2024-09-23 ENCOUNTER — Other Ambulatory Visit: Payer: Self-pay

## 2024-09-23 NOTE — Progress Notes (Signed)
 Opened in error, next injection not due until 1.9.26. Bimzelx  dosed every 8 weeks.

## 2024-10-24 ENCOUNTER — Other Ambulatory Visit: Payer: Self-pay

## 2024-10-24 MED FILL — Dapagliflozin Propanediol Tab 10 MG (Base Equivalent): ORAL | 90 days supply | Qty: 90 | Fill #1 | Status: AC

## 2024-10-25 ENCOUNTER — Other Ambulatory Visit: Payer: Self-pay

## 2024-10-31 ENCOUNTER — Other Ambulatory Visit (HOSPITAL_COMMUNITY): Payer: Self-pay

## 2024-10-31 ENCOUNTER — Other Ambulatory Visit: Payer: Self-pay

## 2024-10-31 NOTE — Progress Notes (Signed)
 Specialty Pharmacy Refill Coordination Note  Cassandra Munoz is a 64 y.o. female contacted today regarding refills of specialty medication(s) Bimekizumab -bkzx (Bimzelx )   Patient requested Delivery   Delivery date: 11/01/24   Verified address: [address], [place] [address]   Medication will be filled on: 10/31/24

## 2024-11-23 ENCOUNTER — Other Ambulatory Visit: Payer: Self-pay

## 2024-11-24 ENCOUNTER — Other Ambulatory Visit: Payer: Self-pay

## 2025-02-17 ENCOUNTER — Encounter

## 2025-02-24 ENCOUNTER — Encounter
